# Patient Record
Sex: Female | Born: 1985 | Race: White | Hispanic: No | Smoking: Former smoker
Health system: Southern US, Community
[De-identification: ages and names within clinical notes are randomized; demographics above are authoritative.]

## PROBLEM LIST (undated history)

## (undated) ENCOUNTER — Inpatient Hospital Stay (HOSPITAL_COMMUNITY): Payer: Self-pay

## (undated) DIAGNOSIS — R569 Unspecified convulsions: Secondary | ICD-10-CM

## (undated) DIAGNOSIS — T8859XA Other complications of anesthesia, initial encounter: Secondary | ICD-10-CM

## (undated) DIAGNOSIS — F32A Depression, unspecified: Secondary | ICD-10-CM

## (undated) DIAGNOSIS — J45909 Unspecified asthma, uncomplicated: Secondary | ICD-10-CM

## (undated) DIAGNOSIS — Z8619 Personal history of other infectious and parasitic diseases: Secondary | ICD-10-CM

## (undated) DIAGNOSIS — F1421 Cocaine dependence, in remission: Secondary | ICD-10-CM

## (undated) DIAGNOSIS — Z7289 Other problems related to lifestyle: Secondary | ICD-10-CM

## (undated) DIAGNOSIS — F445 Conversion disorder with seizures or convulsions: Secondary | ICD-10-CM

## (undated) DIAGNOSIS — F4321 Adjustment disorder with depressed mood: Secondary | ICD-10-CM

## (undated) DIAGNOSIS — S42309A Unspecified fracture of shaft of humerus, unspecified arm, initial encounter for closed fracture: Secondary | ICD-10-CM

## (undated) DIAGNOSIS — K603 Anal fistula, unspecified: Secondary | ICD-10-CM

## (undated) DIAGNOSIS — K589 Irritable bowel syndrome without diarrhea: Secondary | ICD-10-CM

## (undated) DIAGNOSIS — K5090 Crohn's disease, unspecified, without complications: Secondary | ICD-10-CM

## (undated) DIAGNOSIS — F1290 Cannabis use, unspecified, uncomplicated: Secondary | ICD-10-CM

## (undated) DIAGNOSIS — F902 Attention-deficit hyperactivity disorder, combined type: Secondary | ICD-10-CM

## (undated) DIAGNOSIS — K5190 Ulcerative colitis, unspecified, without complications: Secondary | ICD-10-CM

## (undated) DIAGNOSIS — S8292XA Unspecified fracture of left lower leg, initial encounter for closed fracture: Secondary | ICD-10-CM

## (undated) DIAGNOSIS — F329 Major depressive disorder, single episode, unspecified: Secondary | ICD-10-CM

## (undated) DIAGNOSIS — F4311 Post-traumatic stress disorder, acute: Secondary | ICD-10-CM

## (undated) DIAGNOSIS — M79673 Pain in unspecified foot: Secondary | ICD-10-CM

## (undated) DIAGNOSIS — Z973 Presence of spectacles and contact lenses: Secondary | ICD-10-CM

## (undated) DIAGNOSIS — N809 Endometriosis, unspecified: Secondary | ICD-10-CM

## (undated) DIAGNOSIS — T7840XA Allergy, unspecified, initial encounter: Secondary | ICD-10-CM

## (undated) DIAGNOSIS — F3181 Bipolar II disorder: Secondary | ICD-10-CM

## (undated) DIAGNOSIS — K219 Gastro-esophageal reflux disease without esophagitis: Secondary | ICD-10-CM

## (undated) DIAGNOSIS — B029 Zoster without complications: Secondary | ICD-10-CM

## (undated) DIAGNOSIS — E282 Polycystic ovarian syndrome: Secondary | ICD-10-CM

## (undated) HISTORY — DX: Allergy, unspecified, initial encounter: T78.40XA

## (undated) HISTORY — DX: Attention-deficit hyperactivity disorder, combined type: F90.2

## (undated) HISTORY — DX: Gastro-esophageal reflux disease without esophagitis: K21.9

## (undated) HISTORY — DX: Ulcerative colitis, unspecified, without complications: K51.90

## (undated) HISTORY — PX: TUBAL LIGATION: SHX77

## (undated) HISTORY — DX: Irritable bowel syndrome, unspecified: K58.9

## (undated) HISTORY — PX: HAND SURGERY: SHX662

## (undated) HISTORY — DX: Depression, unspecified: F32.A

## (undated) HISTORY — PX: APPENDECTOMY: SHX54

---
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
## 2017-03-31 ENCOUNTER — Emergency Department (HOSPITAL_COMMUNITY)
Admission: EM | Admit: 2017-03-31 | Discharge: 2017-03-31 | Disposition: A | Discharge status: Home / Self Care | Payer: Self-pay | Attending: Emergency Medicine | Admitting: Emergency Medicine

## 2017-03-31 ENCOUNTER — Emergency Department (HOSPITAL_COMMUNITY): Payer: Self-pay

## 2017-03-31 ENCOUNTER — Encounter (HOSPITAL_COMMUNITY): Payer: Self-pay

## 2017-03-31 DIAGNOSIS — F17200 Nicotine dependence, unspecified, uncomplicated: Secondary | ICD-10-CM | POA: Insufficient documentation

## 2017-03-31 DIAGNOSIS — R1011 Right upper quadrant pain: Secondary | ICD-10-CM | POA: Insufficient documentation

## 2017-03-31 DIAGNOSIS — Z79899 Other long term (current) drug therapy: Secondary | ICD-10-CM | POA: Insufficient documentation

## 2017-03-31 HISTORY — DX: Crohn's disease, unspecified, without complications: K50.90

## 2017-03-31 LAB — COMPREHENSIVE METABOLIC PANEL
ALK PHOS: 81 U/L (ref 38–126)
ALT: 10 U/L — ABNORMAL LOW (ref 14–54)
AST: 25 U/L (ref 15–41)
Albumin: 3.5 g/dL (ref 3.5–5.0)
Anion gap: 9 (ref 5–15)
BILIRUBIN TOTAL: 1.2 mg/dL (ref 0.3–1.2)
BUN: 8 mg/dL (ref 6–20)
CALCIUM: 9.3 mg/dL (ref 8.9–10.3)
CO2: 22 mmol/L (ref 22–32)
CREATININE: 0.67 mg/dL (ref 0.44–1.00)
Chloride: 106 mmol/L (ref 101–111)
Glucose, Bld: 88 mg/dL (ref 65–99)
Potassium: 4.8 mmol/L (ref 3.5–5.1)
Sodium: 137 mmol/L (ref 135–145)
Total Protein: 6.4 g/dL — ABNORMAL LOW (ref 6.5–8.1)

## 2017-03-31 LAB — CBC
HCT: 39.2 % (ref 36.0–46.0)
Hemoglobin: 12.6 g/dL (ref 12.0–15.0)
MCH: 27.3 pg (ref 26.0–34.0)
MCHC: 32.1 g/dL (ref 30.0–36.0)
MCV: 85 fL (ref 78.0–100.0)
Platelets: 359 10*3/uL (ref 150–400)
RBC: 4.61 MIL/uL (ref 3.87–5.11)
RDW: 14.2 % (ref 11.5–15.5)
WBC: 9.9 10*3/uL (ref 4.0–10.5)

## 2017-03-31 LAB — URINALYSIS, ROUTINE W REFLEX MICROSCOPIC
Bacteria, UA: NONE SEEN
Bilirubin Urine: NEGATIVE
Glucose, UA: NEGATIVE mg/dL
HGB URINE DIPSTICK: NEGATIVE
KETONES UR: NEGATIVE mg/dL
Nitrite: NEGATIVE
PH: 7 (ref 5.0–8.0)
PROTEIN: NEGATIVE mg/dL
RBC / HPF: NONE SEEN RBC/hpf (ref 0–5)
Specific Gravity, Urine: 1.009 (ref 1.005–1.030)

## 2017-03-31 LAB — LIPASE, BLOOD: Lipase: 27 U/L (ref 11–51)

## 2017-03-31 LAB — I-STAT BETA HCG BLOOD, ED (MC, WL, AP ONLY): I-stat hCG, quantitative: <5 m[IU]/mL (ref <5)

## 2017-03-31 MED ORDER — OXYCODONE-ACETAMINOPHEN 5-325 MG PO TABS
2.0000 | ORAL_TABLET | Freq: Once | ORAL | Status: AC
  Administered 2017-03-31: 2 via ORAL
  Filled 2017-03-31: qty 2

## 2017-03-31 MED ORDER — ONDANSETRON 4 MG PO TBDP
8.0000 mg | ORAL_TABLET | Freq: Once | ORAL | Status: AC
  Administered 2017-03-31: 8 mg via ORAL
  Filled 2017-03-31: qty 2

## 2017-03-31 MED ORDER — HYDROMORPHONE HCL 1 MG/ML IJ SOLN
1.0000 mg | Freq: Once | INTRAMUSCULAR | Status: AC
Start: 2017-03-31 — End: 2017-03-31
  Administered 2017-03-31: 1 mg via INTRAMUSCULAR
  Filled 2017-03-31: qty 1

## 2017-03-31 NOTE — ED Notes (Signed)
Patient transported to Ultrasound 

## 2017-03-31 NOTE — ED Notes (Signed)
Pt understood dc material. NAD noted. 

## 2017-03-31 NOTE — ED Provider Notes (Signed)
[facility] DEPT Provider Note   CSN: [id number] Arrival date & time: 03/31/17  1854     History   Chief Complaint Chief Complaint  Patient presents with  . Abdominal Pain    HPI Barbara Orozco is a 31 y.o. female.  Patient c/o ruq abdominal pain for the past 3-4 days. Pain constant, dull, moderate, occasionally worse post eating. No radiation. Nausea. No vomiting or diarrhea. No constipation.  Hx crohns, but this is different. Not currently on any crohns medication.  States is new to area, recently moved. No chest pain or sob. No cough or uri c/o. No dysuria, hematuria or gu c/o. lnmp 1 week ago, normal. No current vaginal discharge or bleeding. No hx gallstones. Denies fever or chills.    The history is provided by the patient.  Abdominal Pain   Associated symptoms include nausea. Pertinent negatives include fever, vomiting, constipation, dysuria and headaches.    Past Medical History:  Diagnosis Date  . Crohn's disease ([place])     There are no active problems to display for this patient.   History reviewed. No pertinent surgical history.  OB History    No data available       Home Medications    Prior to Admission medications   Medication Sig Start Date End Date Taking? Authorizing Provider  pantoprazole (PROTONIX) 40 MG tablet Take 40 mg by mouth 2 (two) times daily.   Yes [provider]  promethazine (PHENERGAN) 25 MG tablet Take 25 mg by mouth every 6 (six) hours as needed for nausea or vomiting.   Yes [provider]    Family History No family history on file.  Social History Social History  Substance Use Topics  . Smoking status: Current Every Day Smoker  . Smokeless tobacco: Never Used  . Alcohol use No     Allergies   Ibuprofen; Tramadol; and Lithium   Review of Systems Review of Systems  Constitutional: Negative for chills and fever.  HENT: Negative for sore throat.   Eyes: Negative for redness.  Respiratory:  Negative for cough and shortness of breath.   Cardiovascular: Negative for chest pain.  Gastrointestinal: Positive for abdominal pain and nausea. Negative for abdominal distention, constipation and vomiting.  Genitourinary: Negative for dysuria, flank pain, vaginal bleeding and vaginal discharge.  Musculoskeletal: Negative for back pain.  Skin: Negative for rash.  Neurological: Negative for headaches.  Hematological: Does not bruise/bleed easily.  Psychiatric/Behavioral: Negative for confusion.     Physical Exam Updated Vital Signs BP (!) 140/94 (BP Location: Left Arm)   Pulse 93   Temp 98.8 F (37.1 C) (Oral)   Resp 18   SpO2 100%   Physical Exam  Constitutional: She appears well-developed and well-nourished. No distress.  HENT:  Mouth/Throat: Oropharynx is clear and moist.  Eyes: Conjunctivae are normal. No scleral icterus.  Neck: Neck supple. No tracheal deviation present.  Cardiovascular: Normal rate, regular rhythm, normal heart sounds and intact distal pulses.  Exam reveals no gallop and no friction rub.   No murmur heard. Pulmonary/Chest: Effort normal and breath sounds normal. No respiratory distress.  Abdominal: Soft. Normal appearance and bowel sounds are normal. She exhibits no distension and no mass. There is tenderness. There is no rebound and no guarding. No hernia.  ruq tenderness.   Genitourinary:  Genitourinary Comments: No cva tenderness  Musculoskeletal: She exhibits no edema.  Neurological: She is alert.  Skin: Skin is warm and dry. No rash noted. She is not diaphoretic.  Psychiatric: She has a normal mood and affect.  Nursing note and vitals reviewed.    ED Treatments / Results  Labs (all labs ordered are listed, but only abnormal results are displayed)   Results for orders placed or performed during the hospital encounter of 03/31/17  Lipase, blood  Result Value Ref Range   Lipase 27 11 - 51 U/L  Comprehensive metabolic panel  Result Value Ref  Range   Sodium 137 135 - 145 mmol/L   Potassium 4.8 3.5 - 5.1 mmol/L   Chloride 106 101 - 111 mmol/L   CO2 22 22 - 32 mmol/L   Glucose, Bld 88 65 - 99 mg/dL   BUN 8 6 - 20 mg/dL   Creatinine, Ser 0.67 0.44 - 1.00 mg/dL   Calcium 9.3 8.9 - 10.3 mg/dL   Total Protein 6.4 (L) 6.5 - 8.1 g/dL   Albumin 3.5 3.5 - 5.0 g/dL   AST 25 15 - 41 U/L   ALT 10 (L) 14 - 54 U/L   Alkaline Phosphatase 81 38 - 126 U/L   Total Bilirubin 1.2 0.3 - 1.2 mg/dL   GFR calc non Af Amer >60 >60 mL/min   GFR calc Af Amer >60 >60 mL/min   Anion gap 9 5 - 15  CBC  Result Value Ref Range   WBC 9.9 4.0 - 10.5 K/uL   RBC 4.61 3.87 - 5.11 MIL/uL   Hemoglobin 12.6 12.0 - 15.0 g/dL   HCT 39.2 36.0 - 46.0 %   MCV 85.0 78.0 - 100.0 fL   MCH 27.3 26.0 - 34.0 pg   MCHC 32.1 30.0 - 36.0 g/dL   RDW 14.2 11.5 - 15.5 %   Platelets 359 150 - 400 K/uL  Urinalysis, Routine w reflex microscopic  Result Value Ref Range   Color, Urine STRAW (A) YELLOW   APPearance CLEAR CLEAR   Specific Gravity, Urine 1.009 1.005 - 1.030   pH 7.0 5.0 - 8.0   Glucose, UA NEGATIVE NEGATIVE mg/dL   Hgb urine dipstick NEGATIVE NEGATIVE   Bilirubin Urine NEGATIVE NEGATIVE   Ketones, ur NEGATIVE NEGATIVE mg/dL   Protein, ur NEGATIVE NEGATIVE mg/dL   Nitrite NEGATIVE NEGATIVE   Leukocytes, UA TRACE (A) NEGATIVE   RBC / HPF NONE SEEN 0 - 5 RBC/hpf   WBC, UA 0-5 0 - 5 WBC/hpf   Bacteria, UA NONE SEEN NONE SEEN   Squamous Epithelial / LPF 0-5 (A) NONE SEEN  I-Stat beta hCG blood, ED  Result Value Ref Range   I-stat hCG, quantitative <5.0 <5 mIU/mL   Comment 3            EKG  EKG Interpretation None       Radiology No results found.  Procedures Procedures (including critical care time)  Medications Ordered in ED Medications - No data to display   Initial Impression / Assessment and Plan / ED Course  I have reviewed the triage vital signs and the nursing notes.  Pertinent labs & imaging results that were available during my  care of the patient were reviewed by me and considered in my medical decision making (see chart for details).  Labs.   Ultrasound.  No prior records here.  U/s neg acute.  Recheck pt comfortable nad.   rec close pcp/gi f/u.   Final Clinical Impressions(s) / ED Diagnoses   Final diagnoses:  None    New Prescriptions New Prescriptions   No medications on file     Forsan,  Lennette Bihari, MD 04/03/17 [phone]

## 2017-03-31 NOTE — ED Triage Notes (Signed)
Patient complains of RUQ abdominal pain x 3-4 days. States I think its my gallbladder, ongoing nausea with same. She also states that she thinks this pain is different from her crohn's disease. Tender to palpitation. Alert and oriented

## 2017-03-31 NOTE — Discharge Instructions (Signed)
It was our pleasure to provide your ER care today - we hope that you feel better.  Your lab work looks good/normal.  Your ultrasound was negative, no gallstones were seen.   Follow up closely with primary care doctor in the coming week, and GI doctor in the next couple weeks - see referral - call office Tuesday AM to arrange appointment.   Return to ER right away if worse, new symptoms, fevers, persistent vomiting, worsening or severe pain, other concern.   You were given pain medication in the ER - no driving for the next 6 hours.

## 2017-04-12 ENCOUNTER — Ambulatory Visit: Payer: Self-pay | Admitting: Internal Medicine

## 2017-09-27 ENCOUNTER — Emergency Department (HOSPITAL_COMMUNITY)
Admission: EM | Admit: 2017-09-27 | Discharge: 2017-09-27 | Disposition: A | Discharge status: Home / Self Care | Payer: Self-pay | Attending: Emergency Medicine | Admitting: Emergency Medicine

## 2017-09-27 ENCOUNTER — Encounter (HOSPITAL_COMMUNITY): Payer: Self-pay | Admitting: Emergency Medicine

## 2017-09-27 ENCOUNTER — Other Ambulatory Visit: Payer: Self-pay

## 2017-09-27 DIAGNOSIS — N3000 Acute cystitis without hematuria: Secondary | ICD-10-CM | POA: Insufficient documentation

## 2017-09-27 DIAGNOSIS — F17210 Nicotine dependence, cigarettes, uncomplicated: Secondary | ICD-10-CM | POA: Insufficient documentation

## 2017-09-27 DIAGNOSIS — Z79899 Other long term (current) drug therapy: Secondary | ICD-10-CM | POA: Insufficient documentation

## 2017-09-27 DIAGNOSIS — G40909 Epilepsy, unspecified, not intractable, without status epilepticus: Secondary | ICD-10-CM | POA: Insufficient documentation

## 2017-09-27 DIAGNOSIS — R1010 Upper abdominal pain, unspecified: Secondary | ICD-10-CM | POA: Insufficient documentation

## 2017-09-27 DIAGNOSIS — R569 Unspecified convulsions: Secondary | ICD-10-CM

## 2017-09-27 HISTORY — DX: Unspecified asthma, uncomplicated: J45.909

## 2017-09-27 HISTORY — DX: Unspecified convulsions: R56.9

## 2017-09-27 LAB — URINALYSIS, ROUTINE W REFLEX MICROSCOPIC
Bilirubin Urine: NEGATIVE
GLUCOSE, UA: NEGATIVE mg/dL
KETONES UR: NEGATIVE mg/dL
NITRITE: NEGATIVE
PROTEIN: NEGATIVE mg/dL
Specific Gravity, Urine: 1.019 (ref 1.005–1.030)
pH: 5 (ref 5.0–8.0)

## 2017-09-27 LAB — CBC WITH DIFFERENTIAL/PLATELET
BASOS ABS: 0.1 10*3/uL (ref 0.0–0.1)
Basophils Relative: 0 %
Eosinophils Absolute: 0.7 10*3/uL (ref 0.0–0.7)
Eosinophils Relative: 6 %
HCT: 38.3 % (ref 36.0–46.0)
HEMOGLOBIN: 12.9 g/dL (ref 12.0–15.0)
LYMPHS ABS: 3.9 10*3/uL (ref 0.7–4.0)
LYMPHS PCT: 31 %
MCH: 28.5 pg (ref 26.0–34.0)
MCHC: 33.7 g/dL (ref 30.0–36.0)
MCV: 84.7 fL (ref 78.0–100.0)
Monocytes Absolute: 0.9 10*3/uL (ref 0.1–1.0)
Monocytes Relative: 7 %
NEUTROS ABS: 7.2 10*3/uL (ref 1.7–7.7)
NEUTROS PCT: 56 %
Platelets: 395 10*3/uL (ref 150–400)
RBC: 4.52 MIL/uL (ref 3.87–5.11)
RDW: 14.2 % (ref 11.5–15.5)
WBC: 12.7 10*3/uL — AB (ref 4.0–10.5)

## 2017-09-27 LAB — I-STAT BETA HCG BLOOD, ED (MC, WL, AP ONLY): I-stat hCG, quantitative: <5 m[IU]/mL (ref <5)

## 2017-09-27 LAB — COMPREHENSIVE METABOLIC PANEL
ALK PHOS: 79 U/L (ref 38–126)
ALT: 14 U/L (ref 14–54)
AST: 19 U/L (ref 15–41)
Albumin: 4 g/dL (ref 3.5–5.0)
Anion gap: 7 (ref 5–15)
BUN: 17 mg/dL (ref 6–20)
CALCIUM: 9.5 mg/dL (ref 8.9–10.3)
CHLORIDE: 108 mmol/L (ref 101–111)
CO2: 24 mmol/L (ref 22–32)
CREATININE: 0.67 mg/dL (ref 0.44–1.00)
Glucose, Bld: 96 mg/dL (ref 65–99)
Potassium: 3.7 mmol/L (ref 3.5–5.1)
Sodium: 139 mmol/L (ref 135–145)
Total Bilirubin: 0.4 mg/dL (ref 0.3–1.2)
Total Protein: 7.7 g/dL (ref 6.5–8.1)

## 2017-09-27 LAB — LIPASE, BLOOD: LIPASE: 31 U/L (ref 11–51)

## 2017-09-27 MED ORDER — ACETAMINOPHEN 500 MG PO TABS
1000.0000 mg | ORAL_TABLET | Freq: Once | ORAL | Status: AC
  Administered 2017-09-27: 1000 mg via ORAL
  Filled 2017-09-27: qty 2

## 2017-09-27 MED ORDER — ONDANSETRON HCL 4 MG/2ML IJ SOLN
4.0000 mg | Freq: Once | INTRAMUSCULAR | Status: AC
  Administered 2017-09-27: 4 mg via INTRAVENOUS
  Filled 2017-09-27: qty 2

## 2017-09-27 MED ORDER — SODIUM CHLORIDE 0.9 % IV BOLUS (SEPSIS)
1000.0000 mL | Freq: Once | INTRAVENOUS | Status: AC
  Administered 2017-09-27: 1000 mL via INTRAVENOUS

## 2017-09-27 MED ORDER — LEVETIRACETAM 750 MG PO TABS: 750.0000 mg | ORAL_TABLET | Freq: Two times a day (BID) | ORAL | 0 refills | Status: DC

## 2017-09-27 MED ORDER — ONDANSETRON HCL 4 MG PO TABS: 4.0000 mg | ORAL_TABLET | Freq: Four times a day (QID) | ORAL | 0 refills | Status: DC

## 2017-09-27 MED ORDER — LORAZEPAM 2 MG/ML IJ SOLN
1.0000 mg | Freq: Once | INTRAMUSCULAR | Status: AC
  Administered 2017-09-27: 1 mg via INTRAVENOUS
  Filled 2017-09-27: qty 1

## 2017-09-27 MED ORDER — CEPHALEXIN 500 MG PO CAPS: 500.0000 mg | ORAL_CAPSULE | Freq: Two times a day (BID) | ORAL | 0 refills | Status: DC

## 2017-09-27 NOTE — ED Provider Notes (Signed)
[facility]-EMERGENCY DEPT Provider Note   CSN: [id number] Arrival date & time: 09/27/17  0045     History   Chief Complaint Chief Complaint  Patient presents with  . Abdominal Pain  . Seizures    HPI Barbara Orozco is a 31 y.o. female.  Patient presents to the emergency department for evaluation of possible seizure.  Patient reports that she woke up from sleep and was shaking and banging her head on the headboard.  She reports that she was incontinent of urine and bowel.  Patient does report that she has a history of seizures, previously took Neurontin for this, but has been noncompliant because of side effects.  After she awakened she acutely became nauseated and had multiple episodes of vomiting.  She is complaining of the pain in her upper abdomen.  She thinks this is because of the amount of vomiting she did, but she does have a history of Crohn's disease.  The abdominal pain she is experiencing, however, is not consistent with her previous Crohn's flares.       Past Medical History:  Diagnosis Date  . Asthma   . Crohn's disease ([facility])   . Seizures ([facility])     There are no active problems to display for this patient.   History reviewed. No pertinent surgical history.  OB History    No data available       Home Medications    Prior to Admission medications   Medication Sig Start Date End Date Taking? Authorizing Provider  pantoprazole (PROTONIX) 40 MG tablet Take 40 mg by mouth 2 (two) times daily.   Yes [provider]  promethazine (PHENERGAN) 25 MG tablet Take 25 mg by mouth every 6 (six) hours as needed for nausea or vomiting.   Yes [provider]  cephALEXin (KEFLEX) 500 MG capsule Take 1 capsule (500 mg total) by mouth 2 (two) times daily. 09/27/17   Gilda Crease, MD  levETIRAcetam (KEPPRA) 750 MG tablet Take 1 tablet (750 mg total) by mouth 2 (two) times daily. 09/27/17   Gilda Crease, MD    ondansetron (ZOFRAN) 4 MG tablet Take 1 tablet (4 mg total) by mouth every 6 (six) hours. 09/27/17   Gilda Crease, MD    Family History History reviewed. No pertinent family history.  Social History Social History   Tobacco Use  . Smoking status: Current Every Day Smoker  . Smokeless tobacco: Never Used  Substance Use Topics  . Alcohol use: No  . Drug use: No     Allergies   Tramadol; Ibuprofen; and Lithium   Review of Systems Review of Systems  Gastrointestinal: Positive for abdominal pain, nausea and vomiting.  Neurological: Positive for seizures.  All other systems reviewed and are negative.    Physical Exam Updated Vital Signs BP 122/69   Pulse 96   Temp 98.5 F (36.9 C) (Oral)   Resp 16   LMP 09/26/2017 (Exact Date)   SpO2 100%   Physical Exam  Constitutional: She is oriented to person, place, and time. She appears well-developed and well-nourished. No distress.  HENT:  Head: Normocephalic and atraumatic.  Right Ear: Hearing normal.  Left Ear: Hearing normal.  Nose: Nose normal.  Mouth/Throat: Oropharynx is clear and moist and mucous membranes are normal.  Eyes: Conjunctivae and EOM are normal. Pupils are equal, round, and reactive to light.  Neck: Normal range of motion. Neck supple.  Cardiovascular: Regular rhythm, S1 normal and S2 normal. Exam  reveals no gallop and no friction rub.  No murmur heard. Pulmonary/Chest: Effort normal and breath sounds normal. No respiratory distress. She exhibits no tenderness.  Abdominal: Soft. Normal appearance and bowel sounds are normal. There is no hepatosplenomegaly. There is tenderness in the right upper quadrant, epigastric area and left upper quadrant. There is no rebound, no guarding, no tenderness at McBurney's point and negative Murphy's sign. No hernia.  Musculoskeletal: Normal range of motion.  Neurological: She is alert and oriented to person, place, and time. She has normal strength. No cranial  nerve deficit or sensory deficit. Coordination normal. GCS eye subscore is 4. GCS verbal subscore is 5. GCS motor subscore is 6.  Skin: Skin is warm, dry and intact. No rash noted. No cyanosis.  Psychiatric: She has a normal mood and affect. Her speech is normal and behavior is normal. Thought content normal.  Nursing note and vitals reviewed.    ED Treatments / Results  Labs (all labs ordered are listed, but only abnormal results are displayed) Labs Reviewed  CBC WITH DIFFERENTIAL/PLATELET - Abnormal; Notable for the following components:      Result Value   WBC 12.7 (*)    All other components within normal limits  URINALYSIS, ROUTINE W REFLEX MICROSCOPIC - Abnormal; Notable for the following components:   APPearance HAZY (*)    Hgb urine dipstick LARGE (*)    Leukocytes, UA MODERATE (*)    Bacteria, UA RARE (*)    Squamous Epithelial / LPF 6-30 (*)    All other components within normal limits  COMPREHENSIVE METABOLIC PANEL  LIPASE, BLOOD  I-STAT BETA HCG BLOOD, ED ([facility], [facility], [facility] ONLY)    EKG  EKG Interpretation  Date/Time:  Friday September 27 2017 01:15:28 EST Ventricular Rate:  81 PR Interval:    QRS Duration: 91 QT Interval:  365 QTC Calculation: 424 R Axis:   14 Text Interpretation:  Sinus rhythm Low voltage, precordial leads Normal ECG Confirmed by Gilda CreasePollina, Tiberius Loftus J [phone]([id number]) on 09/27/2017 1:21:03 AM       Radiology No results found.  Procedures Procedures (including critical care time)  Medications Ordered in ED Medications  sodium chloride 0.9 % bolus 1,000 mL (0 mLs Intravenous Stopped 09/27/17 0300)  ondansetron (ZOFRAN) injection 4 mg (4 mg Intravenous Given 09/27/17 0136)  LORazepam (ATIVAN) injection 1 mg (1 mg Intravenous Given 09/27/17 0137)  acetaminophen (TYLENOL) tablet 1,000 mg (1,000 mg Oral Given 09/27/17 0455)     Initial Impression / Assessment and Plan / ED Course  I have reviewed the triage vital signs and the nursing  notes.  Pertinent labs & imaging results that were available during my care of the patient were reviewed by me and considered in my medical decision making (see chart for details).     Patient presents to the ER for evaluation of possible seizure.  She does have a history of seizure disorder.  She is not currently taking her medications.  She thinks she might have had a seizure a week ago and then tonight had an episode of shaking.  It was somewhat atypical, as she woke up and noticed that she was shaking, hitting her head on the headboard.  There was urinary and fecal incontinence.  She has not had any further seizure-like activity here in the ER.  She complained of abdominal discomfort, mild and crampy.  She had nausea and vomiting after the seizure.  Abdominal exam was nonfocal.  It has improved with antiemetics and IV fluids.  She did have a slight leukocytosis, but no signs of acute surgical process on examination.  There was some right upper quadrant tenderness.  I discussed ultrasound with her.  She reports that she has had similar pain before, and reviewing records reveal she was seen in the ER previously for right upper quadrant abdominal pain.  Ultrasound in May of this year did not show any gallbladder disease.  Patient does not wish to repeat this testing at this time.    Urinalysis is suggestive of infection, although there are increased epithelial cells, possibly contaminated.  Will send culture and treat empirically.  Patient does have a history of Crohn's disease.  Her symptoms are not consistent with her Crohn's flares.  Doubt that she has any significant process such as abscess or obstruction.  No tenderness at McBurney's point.  Patient recently moved to the area, will refer to gastroenterology.  Cannot rule out seizure, although it seems very atypical.  She has had 2 episodes in the last week, however, with a history of seizures, will require treatment prior to outpatient follow-up.   She thinks she was on Neurontin in the past, changed from Depakote because of nausea.  She is unsure of dosing.  She did not tolerate the Neurontin either and stopped it on her own.  She will therefore be initiated on Keppra and referred to neurology for outpatient.  She was counseled that she should not drive for 6 months, no swimming, no operating machinery, climbing ladders, etc.  Final Clinical Impressions(s) / ED Diagnoses   Final diagnoses:  Pain of upper abdomen  Seizure-like activity ([facility])  Acute cystitis without hematuria    ED Discharge Orders        Ordered    levETIRAcetam (KEPPRA) 750 MG tablet  2 times daily     09/27/17 0605    cephALEXin (KEFLEX) 500 MG capsule  2 times daily     09/27/17 0605    ondansetron (ZOFRAN) 4 MG tablet  Every 6 hours     09/27/17 0605       Gilda Crease, MD 09/27/17 [phone]

## 2017-09-27 NOTE — Discharge Instructions (Signed)
Schedule follow-up with Dr. Lavon Paganini, gastroenterology for further evaluation of your pain management of Crohn's disease.  Schedule follow-up with 1 of the listed neurology groups for further evaluation of seizure-like activity.  Do not drive, operate machinery, climb ladders, swim for 6 months, until cleared by neurology.

## 2017-09-27 NOTE — ED Triage Notes (Addendum)
Patient BIB [facility] from home for abdominal pain and possible seizure. Pt woke from sleep banging head on headboard, out of breath,  had an episode of bowel incontinence and then began to "violently" vomit. Pt has hx of Crohn's disease as well as hx of seizures but is not taking her medication at this time. Pt new to the area and has not found a neurologist. Pt also has a hernia that she states seems aggravated by the vomiting.

## 2017-09-27 NOTE — ED Notes (Signed)
Patient aware urine sample needed, will call for assistance when she feels she can try to urinate again.

## 2017-09-27 NOTE — ED Notes (Signed)
EKG given to EDP,Pollina,MD., for review. 

## 2017-12-03 ENCOUNTER — Emergency Department (HOSPITAL_COMMUNITY): Admission: EM | Admit: 2017-12-03 | Discharge: 2017-12-03 | Discharge status: Against Medical Advice | Payer: Self-pay

## 2017-12-04 ENCOUNTER — Emergency Department (HOSPITAL_COMMUNITY)
Admission: EM | Admit: 2017-12-04 | Discharge: 2017-12-04 | Disposition: A | Discharge status: Home / Self Care | Payer: Self-pay | Attending: Emergency Medicine | Admitting: Emergency Medicine

## 2017-12-04 ENCOUNTER — Encounter (HOSPITAL_COMMUNITY): Payer: Self-pay | Admitting: Emergency Medicine

## 2017-12-04 DIAGNOSIS — F17200 Nicotine dependence, unspecified, uncomplicated: Secondary | ICD-10-CM | POA: Insufficient documentation

## 2017-12-04 DIAGNOSIS — Z79899 Other long term (current) drug therapy: Secondary | ICD-10-CM | POA: Insufficient documentation

## 2017-12-04 DIAGNOSIS — J111 Influenza due to unidentified influenza virus with other respiratory manifestations: Secondary | ICD-10-CM | POA: Insufficient documentation

## 2017-12-04 DIAGNOSIS — J45909 Unspecified asthma, uncomplicated: Secondary | ICD-10-CM | POA: Insufficient documentation

## 2017-12-04 DIAGNOSIS — R69 Illness, unspecified: Secondary | ICD-10-CM

## 2017-12-04 MED ORDER — BENZONATATE 100 MG PO CAPS: 100.0000 mg | ORAL_CAPSULE | Freq: Three times a day (TID) | ORAL | 0 refills | Status: DC

## 2017-12-04 NOTE — ED Provider Notes (Signed)
[facility]-EMERGENCY DEPT Provider Note  CSN: [id number] Arrival date & time: 12/04/17 1026  Chief Complaint(s) Cough  HPI Barbara Orozco is a 32 y.o. female   The history is provided by the patient.  Cough  This is a new problem. Episode onset: 3 days. The problem occurs every few minutes. The problem has not changed since onset.The cough is productive of sputum (white). Maximum temperature: 99. Associated symptoms include chills, headaches (typical migraine), rhinorrhea, sore throat and myalgias. She has tried cough syrup for the symptoms. The treatment provided mild relief. She is a smoker. Her past medical history is significant for asthma.    Past Medical History Past Medical History:  Diagnosis Date  . Asthma   . Crohn's disease (HCC)   . Seizures (HCC)    There are no active problems to display for this patient.  Home Medication(s) Prior to Admission medications   Medication Sig Start Date End Date Taking? Authorizing Provider  benzonatate (TESSALON) 100 MG capsule Take 1 capsule (100 mg total) by mouth every 8 (eight) hours. 12/04/17   Makaila Windle, Amadeo Garnet, MD  cephALEXin (KEFLEX) 500 MG capsule Take 1 capsule (500 mg total) by mouth 2 (two) times daily. 09/27/17   Gilda Crease, MD  levETIRAcetam (KEPPRA) 750 MG tablet Take 1 tablet (750 mg total) by mouth 2 (two) times daily. 09/27/17   Gilda Crease, MD  ondansetron (ZOFRAN) 4 MG tablet Take 1 tablet (4 mg total) by mouth every 6 (six) hours. 09/27/17   Gilda Crease, MD  pantoprazole (PROTONIX) 40 MG tablet Take 40 mg by mouth 2 (two) times daily.    [provider]  promethazine (PHENERGAN) 25 MG tablet Take 25 mg by mouth every 6 (six) hours as needed for nausea or vomiting.    [provider]                                                                                                                                    Past Surgical History History  reviewed. No pertinent surgical history. Family History No family history on file.  Social History Social History   Tobacco Use  . Smoking status: Current Every Day Smoker  . Smokeless tobacco: Never Used  Substance Use Topics  . Alcohol use: No  . Drug use: No   Allergies Tramadol; Ibuprofen; and Lithium  Review of Systems Review of Systems  Constitutional: Positive for chills.  HENT: Positive for rhinorrhea and sore throat.   Respiratory: Positive for cough.   Musculoskeletal: Positive for myalgias. Negative for neck pain and neck stiffness.  Neurological: Positive for headaches (typical migraine).   All other systems are reviewed and are negative for acute change except as noted in the HPI  Physical Exam Vital Signs  I have reviewed the triage vital signs BP 112/74 (BP Location: Left Arm)   Pulse 74   Temp 99.1 F (37.3  C) (Oral)   Resp (!) 22   LMP 11/25/2017   SpO2 98%   Physical Exam  Constitutional: She is oriented to person, place, and time. She appears well-developed and well-nourished. No distress.  HENT:  Head: Normocephalic and atraumatic.  Right Ear: Tympanic membrane normal.  Left Ear: Tympanic membrane normal.  Nose: Mucosal edema and rhinorrhea present.  Mouth/Throat: No uvula swelling. Posterior oropharyngeal erythema (mild with PND) present. No posterior oropharyngeal edema or tonsillar abscesses. No tonsillar exudate.  Eyes: Conjunctivae and EOM are normal. Pupils are equal, round, and reactive to light. Right eye exhibits no discharge. Left eye exhibits no discharge. No scleral icterus.  Neck: Normal range of motion. Neck supple.  Cardiovascular: Normal rate and regular rhythm. Exam reveals no gallop and no friction rub.  No murmur heard. Pulmonary/Chest: Effort normal and breath sounds normal. No stridor. No respiratory distress. She has no rales.  Abdominal: Soft. She exhibits no distension. There is no tenderness.  Musculoskeletal: She  exhibits no edema or tenderness.  Neurological: She is alert and oriented to person, place, and time.  Mental Status:  Alert and oriented to person, place, and time.  Attention and concentration normal.  Speech clear.  Recent memory is intact  Cranial Nerves:  II Visual Fields: Intact to confrontation. Visual fields intact. III, IV, VI: Pupils equal and reactive to light and near. Full eye movement without nystagmus  V Facial Sensation: Normal. No weakness of masticatory muscles  VII: No facial weakness or asymmetry  VIII Auditory Acuity: Grossly normal  IX/X: The uvula is midline; the palate elevates symmetrically  XI: Normal sternocleidomastoid and trapezius strength  XII: The tongue is midline. No atrophy or fasciculations.   Motor System: Muscle Strength: 5/5 and symmetric in the upper and lower extremities. No pronation or drift.  Muscle Tone: Tone and muscle bulk are normal in the upper and lower extremities.   Reflexes: DTRs: 1+ and symmetrical in all four extremities. No Clonus Coordination: Intact finger-to-nose, heel-to-shin. No tremor.  Sensation: Intact to light touch. Negative Romberg test.  Gait: Routine gait normal.   Skin: Skin is warm and dry. No rash noted. She is not diaphoretic. No erythema.  Psychiatric: She has a normal mood and affect.  Vitals reviewed.   ED Results and Treatments Labs (all labs ordered are listed, but only abnormal results are displayed) Labs Reviewed - No data to display                                                                                                                       EKG  EKG Interpretation  Date/Time:    Ventricular Rate:    PR Interval:    QRS Duration:   QT Interval:    QTC Calculation:   R Axis:     Text Interpretation:        Radiology No results found. Pertinent labs & imaging results that were available during my care of the patient were reviewed by me and  considered in my medical decision making  (see chart for details).  Medications Ordered in ED Medications - No data to display                                                                                                                                  Procedures Procedures  (including critical care time)  Medical Decision Making / ED Course I have reviewed the nursing notes for this encounter and the patient's prior records (if available in EHR or on provided paperwork).    32 y.o. female presents with flu-like symptoms for 3 days. adequate oral hydration. Rest of history as above.  Patient appears well. No signs of toxicity, patient is interactive. No hypoxia, tachypnea or other signs of respiratory distress. No sign of clinical dehydration. Lung exam clear. Rest of exam as above.  Typical migraine headache for the pt. Non focal neuro exam. No recent head trauma. No fever. Doubt meningitis. Doubt intracranial bleed. Doubt IIH. No indication for imaging.  Most consistent with flu-like illness   No evidence suggestive of pharyngitis, AOM, PNA, or meningitis.  Chest x-ray not indicated at this time.  Discussed symptomatic treatment with the patient and they will follow closely with their PCP.    Final Clinical Impression(s) / ED Diagnoses Final diagnoses:  Influenza-like illness   Disposition: Discharge  Condition: Good  I have discussed the results, Dx and Tx plan with the patient who expressed understanding and agree(s) with the plan. Discharge instructions discussed at great length. The patient was given strict return precautions who verbalized understanding of the instructions. No further questions at time of discharge.    ED Discharge Orders        Ordered    benzonatate (TESSALON) 100 MG capsule  Every 8 hours     12/04/17 1213       Follow Up: Primary care provider  Schedule an appointment as soon as possible for a visit  As needed      This chart was dictated using voice recognition  software.  Despite best efforts to proofread,  errors can occur which can change the documentation meaning.   Nira Conn, MD 12/04/17 1214

## 2017-12-04 NOTE — ED Triage Notes (Signed)
Patient c/o productive cough, congestion, headache, and body aches x 3 days. Speaking in full sentences without difficulty.

## 2017-12-04 NOTE — Discharge Instructions (Addendum)
If you do not have a primary care physician then it is very important that you develop a relationship with one.  Please contact [facility] at [phone] for a referral to many excellent primary care physicians in the community.   ° ° °

## 2017-12-04 NOTE — ED Notes (Signed)
Bed: WTR8 Expected date:  Expected time:  Means of arrival:  Comments: 

## 2017-12-04 NOTE — ED Triage Notes (Signed)
Pt directed to [facility] for follow up

## 2018-01-20 ENCOUNTER — Other Ambulatory Visit: Payer: Self-pay

## 2018-01-20 ENCOUNTER — Encounter (HOSPITAL_COMMUNITY): Payer: Self-pay | Admitting: Emergency Medicine

## 2018-01-20 ENCOUNTER — Emergency Department (HOSPITAL_COMMUNITY)
Admission: EM | Admit: 2018-01-20 | Discharge: 2018-01-21 | Discharge status: Against Medical Advice | Payer: Self-pay | Attending: Emergency Medicine | Admitting: Emergency Medicine

## 2018-01-20 DIAGNOSIS — M25561 Pain in right knee: Secondary | ICD-10-CM | POA: Insufficient documentation

## 2018-01-20 DIAGNOSIS — J45909 Unspecified asthma, uncomplicated: Secondary | ICD-10-CM | POA: Insufficient documentation

## 2018-01-20 DIAGNOSIS — Z87891 Personal history of nicotine dependence: Secondary | ICD-10-CM | POA: Insufficient documentation

## 2018-01-20 DIAGNOSIS — R51 Headache: Secondary | ICD-10-CM | POA: Insufficient documentation

## 2018-01-20 DIAGNOSIS — R1110 Vomiting, unspecified: Secondary | ICD-10-CM | POA: Insufficient documentation
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
## 2018-01-21 ENCOUNTER — Emergency Department (HOSPITAL_COMMUNITY): Payer: Self-pay

## 2018-01-21 MED ORDER — METOCLOPRAMIDE HCL 5 MG/ML IJ SOLN
10.0000 mg | Freq: Once | INTRAMUSCULAR | Status: AC
  Administered 2018-01-21: 10 mg via INTRAVENOUS
  Filled 2018-01-21: qty 2

## 2018-01-21 MED ORDER — ACETAMINOPHEN 500 MG PO TABS
1000.0000 mg | ORAL_TABLET | Freq: Once | ORAL | Status: AC
  Administered 2018-01-21: 1000 mg via ORAL
  Filled 2018-01-21: qty 2

## 2018-01-21 NOTE — ED Notes (Signed)
Patient transported to X-ray 

## 2018-01-21 NOTE — ED Notes (Addendum)
Pt left AMA. Pt did not receive discharge paperwork before leaving. Attempted to contact patient about removal of IV. Unknown if patient left with IV in place.

## 2018-01-21 NOTE — Discharge Instructions (Signed)
Use ice 3-4 times daily alternating 20 minutes on, 20 minutes off.  Please return to the emergency department if you develop any new or worsening symptoms.  Please understand you are leaving AGAINST MEDICAL ADVICE today and that you assume the risks, including death, of not obtaining imaging I advised today.

## 2018-01-21 NOTE — ED Notes (Signed)
Pt back from Xray. Pt refused scans. EDP aware.
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
## 2019-04-03 DIAGNOSIS — K5090 Crohn's disease, unspecified, without complications: Secondary | ICD-10-CM | POA: Diagnosis not present

## 2019-04-03 DIAGNOSIS — R309 Painful micturition, unspecified: Secondary | ICD-10-CM | POA: Diagnosis not present

## 2019-04-03 DIAGNOSIS — Z6841 Body Mass Index (BMI) 40.0 and over, adult: Secondary | ICD-10-CM | POA: Diagnosis not present

## 2019-06-29 DIAGNOSIS — J189 Pneumonia, unspecified organism: Secondary | ICD-10-CM | POA: Diagnosis not present

## 2019-06-29 DIAGNOSIS — Z20828 Contact with and (suspected) exposure to other viral communicable diseases: Secondary | ICD-10-CM | POA: Diagnosis not present

## 2020-01-22 ENCOUNTER — Ambulatory Visit: Payer: BC Managed Care – PPO | Admitting: Family Medicine

## 2020-01-22 ENCOUNTER — Other Ambulatory Visit: Payer: Self-pay

## 2020-01-22 ENCOUNTER — Encounter: Payer: Self-pay | Admitting: Family Medicine

## 2020-01-22 VITALS — BP 130/70 | HR 81 | Temp 97.8°F | Ht 68.0 in | Wt 293.6 lb

## 2020-01-22 DIAGNOSIS — K51911 Ulcerative colitis, unspecified with rectal bleeding: Secondary | ICD-10-CM

## 2020-01-22 DIAGNOSIS — Z8719 Personal history of other diseases of the digestive system: Secondary | ICD-10-CM | POA: Diagnosis not present

## 2020-01-22 DIAGNOSIS — J4520 Mild intermittent asthma, uncomplicated: Secondary | ICD-10-CM | POA: Insufficient documentation

## 2020-01-22 DIAGNOSIS — Z8711 Personal history of peptic ulcer disease: Secondary | ICD-10-CM | POA: Insufficient documentation

## 2020-01-22 DIAGNOSIS — Z833 Family history of diabetes mellitus: Secondary | ICD-10-CM | POA: Diagnosis not present

## 2020-01-22 LAB — COMPREHENSIVE METABOLIC PANEL
ALT: 11 IU/L (ref 0–32)
AST: 12 IU/L (ref 0–40)
Albumin/Globulin Ratio: 1.3 (ref 1.2–2.2)
Albumin: 3.7 g/dL — ABNORMAL LOW (ref 3.8–4.8)
Alkaline Phosphatase: 108 IU/L (ref 39–117)
BUN/Creatinine Ratio: 18 (ref 9–23)
BUN: 12 mg/dL (ref 6–20)
Bilirubin Total: <0.2 mg/dL (ref 0.0–1.2)
CO2: 23 mmol/L (ref 20–29)
Calcium: 8.7 mg/dL (ref 8.7–10.2)
Chloride: 107 mmol/L — ABNORMAL HIGH (ref 96–106)
Creatinine, Ser: 0.67 mg/dL (ref 0.57–1.00)
GFR calc Af Amer: 134 mL/min/{1.73_m2} (ref >59)
GFR calc non Af Amer: 116 mL/min/{1.73_m2} (ref >59)
Globulin, Total: 2.8 g/dL (ref 1.5–4.5)
Glucose: 100 mg/dL — ABNORMAL HIGH (ref 65–99)
Potassium: 4.4 mmol/L (ref 3.5–5.2)
Sodium: 136 mmol/L (ref 134–144)
Total Protein: 6.5 g/dL (ref 6.0–8.5)

## 2020-01-22 LAB — CBC WITH DIFFERENTIAL/PLATELET
Basophils Absolute: 0 10*3/uL (ref 0.0–0.2)
Basos: 0 %
EOS (ABSOLUTE): 0.5 10*3/uL — ABNORMAL HIGH (ref 0.0–0.4)
Eos: 4 %
Hematocrit: 39.4 % (ref 34.0–46.6)
Hemoglobin: 13.1 g/dL (ref 11.1–15.9)
Lymphocytes Absolute: 3.2 10*3/uL — ABNORMAL HIGH (ref 0.7–3.1)
Lymphs: 28 %
MCH: 28.7 pg (ref 26.6–33.0)
MCHC: 33.2 g/dL (ref 31.5–35.7)
MCV: 86 fL (ref 79–97)
Monocytes Absolute: 0.7 10*3/uL (ref 0.1–0.9)
Monocytes: 6 %
Neutrophils Absolute: 6.9 10*3/uL (ref 1.4–7.0)
Neutrophils: 62 %
Platelets: 411 10*3/uL (ref 150–450)
RBC: 4.57 x10E6/uL (ref 3.77–5.28)
RDW: 14.5 % (ref 11.7–15.4)
WBC: 11.3 10*3/uL — ABNORMAL HIGH (ref 3.4–10.8)

## 2020-01-22 MED ORDER — FLUCONAZOLE 150 MG PO TABS: 150.0000 mg | ORAL_TABLET | Freq: Once | ORAL | 0 refills | Status: AC

## 2020-01-22 MED ORDER — ACETAMINOPHEN-CODEINE #3 300-30 MG PO TABS: 1.0000 | ORAL_TABLET | Freq: Three times a day (TID) | ORAL | 0 refills | Status: AC | PRN

## 2020-01-22 MED ORDER — AMOXICILLIN-POT CLAVULANATE 875-125 MG PO TABS: 1.0000 | ORAL_TABLET | Freq: Two times a day (BID) | ORAL | 0 refills | Status: DC

## 2020-01-22 MED ORDER — PANTOPRAZOLE SODIUM 40 MG PO TBEC: 40.0000 mg | DELAYED_RELEASE_TABLET | Freq: Every day | ORAL | 3 refills | Status: DC

## 2020-01-22 MED ORDER — PREDNISONE 20 MG PO TABS: 60.0000 mg | ORAL_TABLET | Freq: Every day | ORAL | 0 refills | Status: DC

## 2020-01-22 NOTE — Progress Notes (Signed)
Subjective:    Patient ID: Barbara Orozco, female    DOB: 1986-09-20, 34 y.o.   MRN: [id number]  HPI Chief Complaint  Patient presents with  . new pt    new pt, get establishes. can't sit due to abcesses on outside of rectum. needs referral to GI   She is new to the practice and here to establish care.  Previous medical care: moved here from [place] in 2019  Diagnosed with UC 10 years ago.  Dr. Derrek Monaco - GI in [place], [place]   Complains of rectal pain, diarrhea with blood and abdominal cramping with a history of UC and rectal abscess.   Reports last major flare was 6 years ago. States she did not have to have I and D then, only antibiotics.   Has been taking Zofran and Bentyl. This was refilled by [facility].   Requests tylenol with codeine. States she has done well with this in the past.  States she has been taking several Tylenol daily and this is not helping.   Requests protonix due to hx of GERD and stomach ulcers.   States she will need medication for yeast infection if she has to take antibiotics.   Denies fever, chills, dizziness, chest pain, palpitations, shortness of breath,  vomiting.    LMP: currently menstruating   Reports hx of PCOS  Asthma - triggers typically season changes  Takes OTC antihistamine    Would like to be checked for diabetes. Mother and father have diabetes.    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 Alcohol is occasional.  Does not use tobacco.    Reviewed allergies, medications, past medical, surgical, family, and social history.    Review of Systems Pertinent positives and negatives in the history of present illness.     Objective:   Physical Exam Constitutional:      General: She is not in acute distress.    Appearance: She is not toxic-appearing.  Genitourinary:    Rectum: Tenderness present.     Comments: Erythema noted  perirectal and she is quite TTP. DRE not done.  Neurological:     Mental Status: She is alert.    BP 130/70   Pulse 81   Temp 97.8 F (36.6 C)   Ht 5' 8"  (1.727 m)   Wt 293 lb 9.6 oz (133.2 kg)   BMI 44.64 kg/m       Assessment & Plan:  Ulcerative colitis with rectal bleeding, unspecified location [facility]) - Plan: Ambulatory referral to Gastroenterology, CBC with Differential/Platelet, Comprehensive metabolic panel, amoxicillin-clavulanate (AUGMENTIN) 875-125 MG tablet, predniSONE (DELTASONE) 20 MG tablet, acetaminophen-codeine (TYLENOL #3) 300-30 MG tablet  History of stomach ulcers - Plan: pantoprazole (PROTONIX) 40 MG tablet, Ambulatory referral to Gastroenterology  History of rectal abscess - Plan: Ambulatory referral to Gastroenterology, CBC with Differential/Platelet, Comprehensive metabolic panel, amoxicillin-clavulanate (AUGMENTIN) 875-125 MG tablet, predniSONE (DELTASONE) 20 MG tablet  Family history of diabetes mellitus - Plan: Hemoglobin A1c  Morbid obesity ([place]) - Plan: Hemoglobin A1c  Mild intermittent asthma without complication  Consulted with Dr. Redmond School regarding patient.  She does not appear toxic but is quite uncomfortable and did not sit down while in the office.  Stat CBC, CMP ordered.  Exam does not show obvious rectal abscess but she is quite tender so I did not perform a DRE.  I will prescribe Augmentin and prednisone. She has taken  this in the past for acute UC flare.  Referral to GI and she was scheduled for a visit next Tuesday.  She has taken tylenol #3 in the past without issues. Confirmed this since she is allergic to Tramadol. PDMP reviewed  Diflucan per request in case of yeast infection.  Protonix prescribed for GERD and hx of stomach ulcers.  She will follow up with me in 2 weeks.  She is aware that she will need to go to the ED if she worsens over the weekend.

## 2020-01-22 NOTE — Patient Instructions (Signed)
Start the antibiotic this evening.  I recommend you start the prednisone in the morning with food.  Use caution when taking Tylenol 3 as this can be sedating.  You have an appointment to see  GI next Tuesday.  If you get worse over the weekend or if you have uncontrolled vomiting and cannot take your medication then you may need to go to the emergency department.  We will be in touch with your lab results

## 2020-01-22 NOTE — Progress Notes (Signed)
Her white blood cell count is very mildly elevated. Other labs are fine also. I am still waiting on her Hgb A1c to screen for diabetes but her blood sugar was 100 today.

## 2020-01-23 LAB — HEMOGLOBIN A1C
Est. average glucose Bld gHb Est-mCnc: 105 mg/dL
Hgb A1c MFr Bld: 5.3 % (ref 4.8–5.6)

## 2020-01-23 NOTE — Progress Notes (Signed)
No sign of diabetes or even prediabetes.

## 2020-01-26 ENCOUNTER — Encounter: Payer: Self-pay | Admitting: Nurse Practitioner

## 2020-01-26 ENCOUNTER — Ambulatory Visit (INDEPENDENT_AMBULATORY_CARE_PROVIDER_SITE_OTHER): Payer: BC Managed Care – PPO | Admitting: Nurse Practitioner

## 2020-01-26 ENCOUNTER — Other Ambulatory Visit (INDEPENDENT_AMBULATORY_CARE_PROVIDER_SITE_OTHER): Payer: BC Managed Care – PPO

## 2020-01-26 ENCOUNTER — Other Ambulatory Visit: Payer: Self-pay

## 2020-01-26 VITALS — BP 133/72 | HR 89 | Temp 97.8°F | Ht 67.0 in | Wt 291.0 lb

## 2020-01-26 DIAGNOSIS — K51919 Ulcerative colitis, unspecified with unspecified complications: Secondary | ICD-10-CM

## 2020-01-26 DIAGNOSIS — Z01818 Encounter for other preprocedural examination: Secondary | ICD-10-CM

## 2020-01-26 DIAGNOSIS — K6289 Other specified diseases of anus and rectum: Secondary | ICD-10-CM

## 2020-01-26 DIAGNOSIS — K219 Gastro-esophageal reflux disease without esophagitis: Secondary | ICD-10-CM

## 2020-01-26 DIAGNOSIS — R1012 Left upper quadrant pain: Secondary | ICD-10-CM

## 2020-01-26 LAB — SEDIMENTATION RATE: Sed Rate: 43 mm/hr — ABNORMAL HIGH (ref 0–20)

## 2020-01-26 LAB — HIGH SENSITIVITY CRP: CRP, High Sensitivity: 11.54 mg/L — ABNORMAL HIGH (ref 0.000–5.000)

## 2020-01-26 NOTE — Patient Instructions (Addendum)
If you are age 34 or older, your body mass index should be between 23-30. Your Body mass index is 45.58 kg/m. If this is out of the aforementioned range listed, please consider follow up with your Primary Care Provider.  If you are age 34 or younger, your body mass index should be between 19-25. Your Body mass index is 45.58 kg/m. If this is out of the aformentioned range listed, please consider follow up with your Primary Care Provider.   You have been scheduled for an endoscopy and colonoscopy. Please follow the written instructions given to you at your visit today. Please pick up your prep supplies at the pharmacy within the next 1-3 days. If you use inhalers (even only as needed), please bring them with you on the day of your procedure. Your physician has requested that you go to www.startemmi.com and enter the access code given to you at your visit today. This web site gives a general overview about your procedure. However, you should still follow specific instructions given to you by our office regarding your preparation for the procedure.  You have been given a Plenvu sample.  Your provider has requested that you go to the basement level for lab work before leaving today. Press "B" on the elevator. The lab is located at the first door on the left as you exit the elevator.  STOP Prednisone.  Continue Augmentin.  Thank you for choosing me and [facility].   Tye Savoy, NP

## 2020-01-26 NOTE — Progress Notes (Signed)
ASSESSMENT / PLAN:   34 year old female with history of morbid obesity, ulcerative colitis, GERD, asthma appendectomy  Ulcerative colitis --Diagnosed 6 years ago at outside facility --Records from [facility] unavailable, practice closed and patient unable to get records --Off treatment (doesn't know what she took) for years due to insurance coverage and also felt like medications did not help.  --Now with flare-like symptoms for several months.  --She has a history of a rectal abscess but also history of "fisulas" which would be unusual for UC. No signs of a fistula on exam today.  She had a small abnormality of left perianal area but nothing appeared to be oozing from it.  She was very tender just examining the perianal area so I did not attempt a DRE --She feels like the pus leakage is a little better after starting Augmentin on Friday, she will complete course of Augmentin  --She was prescribed prednisone Friday.  She is out of pills. I prefer to hold off on continuing steroids for now ( no records available) at least until CRP, ESR results available --Obtain ESR, CRP --Patient will be scheduled for EGD (longstanding GERD and LUQ pain) and colonoscopy. The risks and benefits of EGD and colonoscopy with possible polypectomy / biopsies were discussed and the patient agrees to proceed.    # Longstanding GERD --The most part she is asymptomatic on PPI  # Obesity   HPI:    Chief Complaint: Ulcerative colitis, abdominal pain   Barbara Orozco is a 34 year old female, new to the practice, referred by PCP Harland Dingwall, NP for evaluation of ulcerative colitis.  She moved from [place] to [place] a couple of years ago.  Patient has been a unable to get from previous GI in [place] ( no longer in practice).  Diagnosed with ulcerative colitis approximately 6 years ago.    [place] cannot remember what medication she was taking for UC but had been tried on a couple.  She was  also apparently treated with pain medications at the time and they made her sleepy . She discontinued medications about 3-1/2 to 4 years ago due to loss of insurance but also felt like the medications were not helping.  She managed to get off pain meds and uses a combination of THC and CBD oils t treat her pain. She has a history of a rectal abscess approximately 4 years ago.  Sounds like the abscess was lanced and she was treated with antibiotics.  Barbara Orozco has chronic loose stool dating back to childhood but over the last several months she has noticed an increased frequency of bowel movements with associated lower abdominal cramping. She sees pus and blood in her stool.  Frequently experiences drainage of "pus" type substance  from the anus and has associated rectal discomfort making it difficult to sit for prolonged periods.  Additionally, patient complains of postprandial LUQ pain.  The pain is chronic but has gotten worse over the last 4 months     Data Reviewed:  01/16/2020 WBC 11.3, remainder of CBC normal CMP essentially unremarkable  Past Medical History:  Diagnosis Date  . Asthma   . Crohn's disease ([place])   . GERD (gastroesophageal reflux disease)   . Seizures ([place])   . Ulcerative colitis [facility])      Past Surgical History:  Procedure Laterality Date  . APPENDECTOMY    . HAND SURGERY Left    x3   Family History  Problem Relation Age of Onset  . Colitis Mother   . Ulcerative colitis Mother   . Colitis Father   . Ulcerative colitis Father   . Colitis Paternal Grandmother   . Ulcerative colitis Paternal Grandmother    Social History   Tobacco Use  . Smoking status: Former Research scientist (life sciences)  . Smokeless tobacco: Never Used  Substance Use Topics  . Alcohol use: No  . Drug use: Yes    Types: Marijuana   Current Outpatient Medications  Medication Sig Dispense Refill  . acetaminophen (TYLENOL) 325 MG tablet Take 650 mg by mouth every 6 (six) hours as needed for headache.    Marland Kitchen  amoxicillin-clavulanate (AUGMENTIN) 875-125 MG tablet Take 1 tablet by mouth 2 (two) times daily. 20 tablet 0  . BLACK CURRANT SEED OIL PO Take by mouth.    . Calcium Acetate, Phos Binder, (CALCIUM ACETATE PO) Take by mouth.    . dicyclomine (BENTYL) 10 MG capsule Take 10 mg by mouth daily.    . ondansetron (ZOFRAN) 4 MG tablet Take 1 tablet (4 mg total) by mouth every 6 (six) hours. 12 tablet 0  . pantoprazole (PROTONIX) 40 MG tablet Take 1 tablet (40 mg total) by mouth daily. 30 tablet 3  . VITAMIN D, CHOLECALCIFEROL, PO Take by mouth.     No current facility-administered medications for this visit.   Allergies  Allergen Reactions  . Tramadol Hives  . Ibuprofen     Due to Crohn's Disease  . Lithium Rash     Review of Systems: Positive for fatigue, headaches, menstrual pain, muscle pain and cramps, night sweats and sleeping problems.  All other systems reviewed and negative except where noted in HPI.   Serum creatinine: 0.67 mg/dL 01/22/20 1440 Estimated creatinine clearance: 141.8 mL/min   Physical Exam:    Wt Readings from Last 3 Encounters:  01/26/20 291 lb (132 kg)  01/22/20 293 lb 9.6 oz (133.2 kg)  01/20/18 250 lb (113.4 kg)    BP 133/72   Pulse 89   Temp 97.8 F (36.6 C)   Ht 5' 7"  (1.702 m)   Wt 291 lb (132 kg)   SpO2 98%   BMI 45.58 kg/m  Constitutional:  Pleasant female in no acute distress. Psychiatric: Normal mood and affect. Behavior is normal. EENT: Pupils normal.  Conjunctivae are normal. No scleral icterus. Neck supple.  Cardiovascular: Normal rate, regular rhythm. No edema Pulmonary/chest: Effort normal and breath sounds normal. No wheezing, rales or rhonchi. Abdominal: Limited exam.  Patient could only lie on her side.  Lying on back causes rectal pain.  Abdomen is soft . Bowel sounds active throughout.  Rectal: Left lateral perianal area with a small area containing several pinpoint size lesions. No drainage or oozing. Area tender to manipulate.  DRE not done due to discomfort.   Neurological: Alert and oriented to person place and time. Skin: Skin is warm and dry. No rashes noted.  Tye Savoy, NP  01/26/2020, 10:07 AM  Cc:  Referring Provider Girtha Rm, NP-C

## 2020-01-29 ENCOUNTER — Telehealth: Payer: Self-pay | Admitting: Nurse Practitioner

## 2020-01-29 NOTE — Telephone Encounter (Signed)
Called the patient back. No answer. Left a message asking she call again. The provider is recommending strongly that she go back on prednisone.

## 2020-02-01 ENCOUNTER — Other Ambulatory Visit: Payer: Self-pay

## 2020-02-01 MED ORDER — PREDNISONE 10 MG PO TABS: ORAL_TABLET | ORAL | 0 refills | Status: DC

## 2020-02-01 NOTE — Telephone Encounter (Signed)
See the lab results. Patient agrees to go on a prednisone taper for UC flare. She will start at 40 mg and tapering by 5 mg every 7 days.

## 2020-02-10 ENCOUNTER — Telehealth: Payer: Self-pay | Admitting: Family Medicine

## 2020-02-10 MED ORDER — MONTELUKAST SODIUM 10 MG PO TABS: 10.0000 mg | ORAL_TABLET | Freq: Every day | ORAL | 0 refills | Status: DC

## 2020-02-10 NOTE — Telephone Encounter (Signed)
Sent in med and pt already has appt

## 2020-02-10 NOTE — Telephone Encounter (Signed)
Ok to send in one month of Singulair to take at bedtime. Follow up in 4 weeks.

## 2020-02-10 NOTE — Telephone Encounter (Signed)
Pt Called and wanted to see if she could get some Singulair called into the [facility] on [address]. She stated that she is having to use her inhaler more, having SOB and watery eyes

## 2020-02-12 ENCOUNTER — Ambulatory Visit: Payer: BC Managed Care – PPO | Admitting: Family Medicine

## 2020-02-19 ENCOUNTER — Ambulatory Visit: Payer: BC Managed Care – PPO | Admitting: Family Medicine

## 2020-02-19 ENCOUNTER — Other Ambulatory Visit: Payer: Self-pay

## 2020-02-19 ENCOUNTER — Encounter: Payer: Self-pay | Admitting: Family Medicine

## 2020-02-19 VITALS — BP 120/80 | HR 93 | Temp 97.5°F | Wt 292.2 lb

## 2020-02-19 DIAGNOSIS — R569 Unspecified convulsions: Secondary | ICD-10-CM | POA: Diagnosis not present

## 2020-02-19 DIAGNOSIS — K51911 Ulcerative colitis, unspecified with rectal bleeding: Secondary | ICD-10-CM | POA: Diagnosis not present

## 2020-02-19 NOTE — Patient Instructions (Signed)
Try taking melatonin 5 mg and then increase to 10 mg if needed.   I am referring you to [facility] and you should hear from them next week.  Avoid driving until cleared by them.  If you have a seizure, I recommend that you call 911 or go to the closest emergency department for an evaluation.

## 2020-02-19 NOTE — Progress Notes (Signed)
   Subjective:    Patient ID: Barbara Orozco, female    DOB: 11-12-1985, 34 y.o.   MRN: [id number]  HPI Chief Complaint  Patient presents with  . 2 week follow-up    follow-up. seen GI and on prediosone. was doing well on high dose- but taper off symptoms,    Previous medical care in [place], [place] Dr. Delorse Limber.   Here today for a follow up and with a new concern.   She is on oral steroids now for Crohn's and is due to follow up with GI for a colonoscopy soon.   States she has been having "seizures". Reports having a history of seizure disorder. Not diagnosed by neurology. States this has never been worked up.  Ongoing for 10 or more years.  States she has been having more seizure activity lately and connects this with her crohn's and pain from her condition.  Her significant other is with her today. Describes her seizure as her getting quite and going limp initially and then having whole body "convulsions" for 30 seconds to 1 full minute. She does not respond to him when having these convulsions.  She reports having had a seizure alone and waking up on the bathroom floor in her body fluids.   Stats she does not drive.   Has taken Keppra in the past but did not like how she felt. Felt like a "zombie".  Per her EMR, she was taking Neurontin at one point.   States her mother has a pituitary tumor. Father with epilepsy.   States she just recently got health insurance and not having insurance was a barrier to her being able to get her health under good control.   States she is having sleep issues since being on prednisone. Taking it in the morning. Has tried Benadryl.   Reviewed allergies, medications, past medical, surgical, family, and social history.    Review of Systems Pertinent positives and negatives in the history of present illness.     Objective:   Physical Exam BP 120/80   Pulse 93   Temp (!) 97.5 F (36.4 C)   Wt 292 lb 3.2 oz (132.5 kg)   BMI 45.76  kg/m   Alert and oriented and in no acute distress. She is standing during the visit. Respirations unlabored. Normal speech, mood and thought process.       Assessment & Plan:  Ulcerative colitis with rectal bleeding, unspecified location ([place])  Observed seizure-like activity ([place]) - Plan: Ambulatory referral to Neurology  She is now under the care of GI for Crohn's. Steroids keeping her awake and she is taking them in the morning. Try melatonin for sleeping issue related to steroids.  Seizure activity- urgent referral to neurology. No medication prescribed today. She will not drive. Advised that if she has another seizure that lasts longer than her usual minute to call 911 or go to the ED.

## 2020-02-22 ENCOUNTER — Telehealth: Payer: Self-pay | Admitting: Internal Medicine

## 2020-02-22 ENCOUNTER — Telehealth: Payer: Self-pay | Admitting: Nurse Practitioner

## 2020-02-22 NOTE — Telephone Encounter (Signed)
Barbara Orozco, she was already on antibiotics when I saw her in clinic for the first time.  At that time I did not see an active fistula.  I believe I started her on prednisone based on labs that returned after a clinic visit?  I would like to hold off on antibiotics, we have only seen her once and she is scheduled for endoscopic evaluation in a few days.  She has apparently had UC, untreated for years. Thanks

## 2020-02-22 NOTE — Telephone Encounter (Signed)
Okay. She is good with that. She says if she develops UTI symptoms she will go to an Urgent Care. Apparently she frequently has UTI with a rupture of the fistula. She is on prednisone. She started 25 mg today from 30 mg.

## 2020-02-22 NOTE — Telephone Encounter (Signed)
Pt reported that she has a bleeding fistula and requested antibiotics.  She stated that Augmentin does not work for her.  Pt is scheduled for a colonoscopy 02/25/20 and requested to have covid test in [place] since she is unable to travel to [place] due to bleeding fistula.

## 2020-02-22 NOTE — Telephone Encounter (Signed)
Spoke with the patient. She is out of town for a funeral of her relative. She will be back tomorrow. I can change the time of the COVID test. She states she has a history of fistulas. She felt this particular one developing. It was hard and painful. Burst last night and has begun draining. The pressure pain is better. Now it is her "usual" pain with a fistula. Denies fever. She is asking for antibiotics. Treated with Augmentin last month. She asks not to use that if you will treat. Her colonoscopy and EGD at on Thursday of this week.  Patient was seen last 01/26/20. Please advise.

## 2020-02-22 NOTE — Telephone Encounter (Signed)
Called the patient back. No answer. Left her a voicemail. COVID test that is scheduled for 02/23/20 must be done at the lab in [place] as originally planned.  Need her symptoms about the fistula. She was being treated with Augmentin in March when she came to see Tye Savoy, NP

## 2020-02-22 NOTE — Telephone Encounter (Signed)
Okay. The fistulas are interesting. Those are usually associated with Crohn's not UC. We should know in the next few days if she has UC or Crohn's as she came to [place] with no records and hadn't been treated in year for IBD.

## 2020-02-22 NOTE — Telephone Encounter (Signed)
.  A user error has taken place: error

## 2020-02-22 NOTE — Telephone Encounter (Signed)
Patient is returning your call.  

## 2020-02-23 ENCOUNTER — Encounter: Payer: Self-pay | Admitting: Internal Medicine

## 2020-02-23 ENCOUNTER — Other Ambulatory Visit: Payer: Self-pay | Admitting: Internal Medicine

## 2020-02-23 ENCOUNTER — Ambulatory Visit (INDEPENDENT_AMBULATORY_CARE_PROVIDER_SITE_OTHER): Payer: BC Managed Care – PPO

## 2020-02-23 DIAGNOSIS — Z1159 Encounter for screening for other viral diseases: Secondary | ICD-10-CM

## 2020-02-24 LAB — SARS CORONAVIRUS 2 (TAT 6-24 HRS): SARS Coronavirus 2: NEGATIVE

## 2020-02-25 ENCOUNTER — Other Ambulatory Visit: Payer: Self-pay

## 2020-02-25 ENCOUNTER — Ambulatory Visit
Admission: RE | Admit: 2020-02-25 | Discharge: 2020-02-25 | Disposition: A | Discharge status: Home / Self Care | Payer: BC Managed Care – PPO | Source: Ambulatory Visit | Attending: Nurse Practitioner | Admitting: Nurse Practitioner

## 2020-02-25 ENCOUNTER — Ambulatory Visit (AMBULATORY_SURGERY_CENTER): Payer: Self-pay | Admitting: Internal Medicine

## 2020-02-25 ENCOUNTER — Encounter: Payer: Self-pay | Admitting: Internal Medicine

## 2020-02-25 VITALS — BP 107/61 | HR 85 | Temp 96.8°F | Ht 67.0 in | Wt 291.0 lb

## 2020-02-25 DIAGNOSIS — K611 Rectal abscess: Secondary | ICD-10-CM

## 2020-02-25 DIAGNOSIS — K51911 Ulcerative colitis, unspecified with rectal bleeding: Secondary | ICD-10-CM

## 2020-02-25 DIAGNOSIS — K51919 Ulcerative colitis, unspecified with unspecified complications: Secondary | ICD-10-CM

## 2020-02-25 DIAGNOSIS — R197 Diarrhea, unspecified: Secondary | ICD-10-CM | POA: Diagnosis not present

## 2020-02-25 DIAGNOSIS — R1012 Left upper quadrant pain: Secondary | ICD-10-CM

## 2020-02-25 DIAGNOSIS — K219 Gastro-esophageal reflux disease without esophagitis: Secondary | ICD-10-CM

## 2020-02-25 MED ORDER — HYDROCODONE-ACETAMINOPHEN 5-325 MG PO TABS: 1.0000 | ORAL_TABLET | Freq: Four times a day (QID) | ORAL | 0 refills | Status: DC | PRN

## 2020-02-25 MED ORDER — IOPAMIDOL (ISOVUE-300) INJECTION 61%
100.0000 mL | Freq: Once | INTRAVENOUS | Status: AC | PRN
  Administered 2020-02-25: 100 mL via INTRAVENOUS

## 2020-02-25 MED ORDER — AMOXICILLIN-POT CLAVULANATE 875-125 MG PO TABS: 1.0000 | ORAL_TABLET | Freq: Two times a day (BID) | ORAL | 0 refills | Status: AC

## 2020-02-25 MED ORDER — SODIUM CHLORIDE 0.9 % IV SOLN: 500.0000 mL | Freq: Once | INTRAVENOUS | Status: DC

## 2020-02-25 NOTE — Progress Notes (Addendum)
Barbara Orozco had a seizure last week - unable to do procedures in [name] with seizures within past 3 mos.  Having bloody and purulent dc from perianal area   Also persistent LUQ pain, sig rectal pain - on right and feels internal pressure   Prednisone has helped diarrhea but not as good overall down at 30-25 mg   Was on Augmentin previously  Exam:  abd soft tender mild-mod LUQ   Rectal w/ female staff present - there is a small pointed up lesion with pus at top - tender - not fluctuant, somewhat firm - did not try to express   Plan: 1) Augmentin 2) CT abd/pelvis 3) sitz baths 4) vicodin 5 mg # 10 prn 5) will reschedule endo w/u at hospital 6) she does not drive already 7) prednisone - back to 30 mg for now 8) schedule neuro appt - she plans to call

## 2020-02-25 NOTE — Progress Notes (Signed)
Patient stated she had a seizure last week that lasted about 30 seconds.  Patient does not take any medications when these happen.  Lafe Garin, CRNA and Dr. Carlean Purl are both aware and will discuss options with patient.

## 2020-02-25 NOTE — Progress Notes (Signed)
Barbara Orozco here to give pt instructions for CT scan.  Understanding voiced and pt will go directly to appt.Marland Kitchen

## 2020-02-26 ENCOUNTER — Telehealth: Payer: Self-pay | Admitting: Internal Medicine

## 2020-02-26 ENCOUNTER — Other Ambulatory Visit: Payer: Self-pay

## 2020-02-26 MED ORDER — PREDNISONE 10 MG PO TABS: 30.0000 mg | ORAL_TABLET | Freq: Every day | ORAL | 0 refills | Status: DC

## 2020-02-26 NOTE — Telephone Encounter (Signed)
I told her we have sent in #100 Prednisone today so she will have plenty. She does want to know how long you want her to continue it. She is aware that Dr Carlean Purl is away today.   She also would like to request a diflucan rx for the possible yeast infection she gets when taking antibiotics. She said it usually happens after about 4 days on the antibiotics.

## 2020-02-26 NOTE — Telephone Encounter (Signed)
Called the patient. She asked me about the prednisone as well. I also sent a renewal of the prednisone.

## 2020-02-26 NOTE — Telephone Encounter (Signed)
Pt states that Dr. Carlean Purl told her yesterday to go back on prednisone 56m. Pt wants to know how long she needs to take 364m She only has pills until Monday. Pls call her.

## 2020-02-26 NOTE — Telephone Encounter (Signed)
-----   Message from Gatha Mayer, MD sent at 02/26/2020  8:00 AM EDT ----- Regarding: ct results [name],  If you will let Barbara Orozco know there is not anything that needs surgery - I.e. no large abscess.  She needs to continue with treatment plan and we will contact her when I am back next week re: setting up a colonoscopy at the hospital.  I hope she is setting up a neurology appt re: seizures as well.  Also - the "diarrheal state" on the CT is her colon prep and we do not need stool cultures as radiologist suggested.  CE[facility]

## 2020-02-29 ENCOUNTER — Other Ambulatory Visit: Payer: Self-pay

## 2020-02-29 MED ORDER — FLUCONAZOLE 100 MG PO TABS: 100.0000 mg | ORAL_TABLET | Freq: Every day | ORAL | 0 refills | Status: AC

## 2020-03-02 NOTE — Telephone Encounter (Addendum)
PJ - she needs a colonoscopy at the hospital due to seizure disorder we had to cancel when she came   1) Please find out how she is doing 2) see if we can do it as last case on my next scope day at hospital may 11 at 1215ish  Dx is colitis

## 2020-03-02 NOTE — Telephone Encounter (Signed)
I spoke to [place] and she was at work and couldn't check her schedule to  see if that date will work, she will call me in the AM.   She is still having a lot of drainage and pus from the abscess. She is taking her antibiotics, no fever.

## 2020-03-03 NOTE — Telephone Encounter (Signed)
Please advise another date/time Sir, thank you.

## 2020-03-03 NOTE — Telephone Encounter (Signed)
Pt returned your call. She stated that she cannot do that date as she was not able to get off from work. She stated that she needs 2 weeks notice at her work.

## 2020-03-04 ENCOUNTER — Encounter: Payer: Self-pay | Admitting: Neurology

## 2020-03-04 NOTE — Telephone Encounter (Signed)
Patient informed and I told her we are still working on getting a hospital date for her.

## 2020-03-04 NOTE — Telephone Encounter (Signed)
Reduce to 25 mg (2.5 tabs daily)

## 2020-03-04 NOTE — Telephone Encounter (Signed)
Please advise regarding her prednisone dose Sir, thank you?

## 2020-03-10 ENCOUNTER — Telehealth: Payer: Self-pay | Admitting: Internal Medicine

## 2020-03-10 NOTE — Telephone Encounter (Signed)
I spoke to Barbara Orozco briefly as she is at work. She can do 04/26/2020. She is going to call me back on her lunch break today so we can talk more.

## 2020-03-10 NOTE — Telephone Encounter (Signed)
Patient will be available until 3:30pm today.

## 2020-03-10 NOTE — Telephone Encounter (Signed)
I cannot do colonoscopy until 6/22 - please see if she can do that day  Need a symptom update and she should try to schedule a f/u w/ Nevin Bloodgood later this month or in early June  We can do the prep Rx etc then  Let me know

## 2020-03-11 ENCOUNTER — Other Ambulatory Visit: Payer: Self-pay | Admitting: Internal Medicine

## 2020-03-11 DIAGNOSIS — K51911 Ulcerative colitis, unspecified with rectal bleeding: Secondary | ICD-10-CM

## 2020-03-11 MED ORDER — DIPHENOXYLATE-ATROPINE 2.5-0.025 MG PO TABS: 1.0000 | ORAL_TABLET | Freq: Four times a day (QID) | ORAL | 0 refills | Status: DC | PRN

## 2020-03-11 MED ORDER — ACETAMINOPHEN-CODEINE #3 300-30 MG PO TABS: 1.0000 | ORAL_TABLET | ORAL | 0 refills | Status: DC | PRN

## 2020-03-11 NOTE — Telephone Encounter (Signed)
Patient informed and I will fax paperwork for urgent referral to [facility] today.   Barbara Orozco has another question of would youi be able to do FMLA paperwork for her to get medical bathroon breaks and at least one day off a week for Dr appointments? 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  She said she was to have an EGD also which I see was cancelled so I called the hospital and this has been added to the case.

## 2020-03-11 NOTE — Telephone Encounter (Signed)
I spoke with Barbara Orozco and she is still having problems. No fever. Still pain under her ribs on the left side. Past few days a lot of drainage-green and pus. , bright red blood with wiping. There is a Hard knot where her abscess is. She is having 8-9 episodes of diarrhea daily. She takes her last dose of antibiotic tonight. She has several questions: Do you want her to continue her current dose of prednisone which is 83m daily. She said she works 12 hour days and can't do that on vicodin. She is requesting something " less strong" like tylenol #3 .   She has a PFreeportappointment for 03/29/2020 at 2:00pm.  I will go ahead and book her colon for 04/26/20 and her covid testing for 04/22/2020. These dates are okay per patient.

## 2020-03-11 NOTE — Telephone Encounter (Signed)
Tylenol # 3 called in and also Lomotil (for diarrhea but may help pain)  Refer to [facility] srurgery re: perirectal abscess  Stay on 25 mg prednisone

## 2020-03-14 NOTE — Telephone Encounter (Signed)
Yes I will fill out FMLA

## 2020-03-14 NOTE — Telephone Encounter (Signed)
Patient informed and she will take the FMLA paperwork to where they told her to take it today. She also ask to be put on the cancellation list for Nevin Bloodgood to get seen sooner if possible. I will put her on the list.

## 2020-03-21 ENCOUNTER — Telehealth: Payer: Self-pay | Admitting: Internal Medicine

## 2020-03-21 DIAGNOSIS — K6289 Other specified diseases of anus and rectum: Secondary | ICD-10-CM | POA: Diagnosis not present

## 2020-03-21 NOTE — Telephone Encounter (Signed)
I have sent  Jaydyn a [facility] message and called and left her a voice mail with the appointment informaion.  I called [facility] and they said her appointment is today at 1:30pm, she is to arrive at 1:00pm.

## 2020-03-24 ENCOUNTER — Telehealth: Payer: Self-pay | Admitting: Family Medicine

## 2020-03-24 NOTE — Telephone Encounter (Signed)
Received a fax from [facility] in response to records request. Per facility not a pt.

## 2020-03-29 ENCOUNTER — Other Ambulatory Visit: Payer: Self-pay | Admitting: Internal Medicine

## 2020-03-29 ENCOUNTER — Encounter: Payer: Self-pay | Admitting: Nurse Practitioner

## 2020-03-29 ENCOUNTER — Ambulatory Visit (INDEPENDENT_AMBULATORY_CARE_PROVIDER_SITE_OTHER): Payer: BC Managed Care – PPO | Admitting: Nurse Practitioner

## 2020-03-29 VITALS — BP 110/80 | HR 76 | Ht 67.0 in | Wt 293.0 lb

## 2020-03-29 DIAGNOSIS — G40909 Epilepsy, unspecified, not intractable, without status epilepticus: Secondary | ICD-10-CM | POA: Diagnosis not present

## 2020-03-29 DIAGNOSIS — K529 Noninfective gastroenteritis and colitis, unspecified: Secondary | ICD-10-CM | POA: Diagnosis not present

## 2020-03-29 DIAGNOSIS — K51911 Ulcerative colitis, unspecified with rectal bleeding: Secondary | ICD-10-CM | POA: Diagnosis not present

## 2020-03-29 MED ORDER — PREDNISONE 10 MG PO TABS: 25.0000 mg | ORAL_TABLET | Freq: Every day | ORAL | 1 refills | Status: DC

## 2020-03-29 NOTE — Patient Instructions (Signed)
Stop your lomotil.  We have sent the following medications to your pharmacy for you to pick up at your convenience: Prednisone  You have been scheduled for an endoscopy and colonoscopy. Please follow the written instructions given to you at your visit today. Please pick up your prep supplies at the pharmacy within the next 1-3 days. If you use inhalers (even only as needed), please bring them with you on the day of your procedure.   I appreciate the opportunity to care for you. Tye Savoy, NP-C

## 2020-03-30 ENCOUNTER — Encounter: Payer: Self-pay | Admitting: Nurse Practitioner

## 2020-03-30 NOTE — Progress Notes (Signed)
IMPRESSION and PLAN:    34 yo female with pmh significant for morbid obesity, inflammatory bowel disease, GERD, asthma and appendectomy.   # IBD, ulcerative colitis per patient --No records available.  Apparently diagnosed many years ago.  Off treatment for years --Intermittent perianal abscesses requiring antibiotics.  --We have been trying to get her colonoscopy done but she had a recent seizure and then there were some scheduling problems with her work.  She has now been scheduled to have colonoscopy late June at the hospital.  --Diarrhea has been difficult to manage.  She is very sensitive to antimotility agents as just one tablet can completely lock up her bowels. Hold off on Imodium and Lomotil for now --It would be helpful if we could solidify her bowel movements.  Perhaps cholestyramine would be helpful?? --Continue prednisone 25 mg until time of colonoscopy.  Refills provided. --There is no obvious abscess on exam today, no need for a third round of Augmentin.  # Seizure Disorder --She had another " small seizure" a week or so ago.  I will pass this information on to Dr. Carlean Purl to see if this her fears with plans for colonoscopy next month.  --Unfortunately patient has not been able to secure an appointment with Neurology until 06/10/20.   # LUQ pain --She is scheduled for EGD to be done at time of colonoscopy next month.  Pain is exacerbated by bending over, I do wonder about a musculoskeletal component to the pain.   HPI:    Primary GI: Silvano Rusk, MD  Chief complaint : IBD / abscess follow up  01/26/20 established care here for history of ulcerative colitis diagnosed 6 years ago at outside facility.  Fortunately, records from [facility] practice unavailable.  Patient had been off treatment for years due to lack of insurance coverage and also felt like medications did not work.  She presented to [place] with flare-like symptoms.  She was having rectal discomfort, gave a  history of rectal abscesses and possibly fistulas.  Several days prior to our visit PCP had treated her with Augmentin and prednisone. On my exam there was no sign of fistula/abscess that day.  Patient also gave history of longstanding GERD and was having LUQ pain.  Labs including inflammatory markers were obtained, she was scheduled for an EGD and colonoscopy.  Inflammatory markers came back elevated, I called started her on tapering dose of prednisone.   02/22/20 Patient called office, reported that she has a bleeding fistula and requested antibiotics.    The message was sent to me, I opted to hold off on antibiotics since I had not seen an abscess/fistula on exam a few weeks earlier.  Plan was to wait and see what colonoscopy showed.  02/25/20 -patient came for colonoscopy.  Unfortunately she had experienced a seizure the week prior and we were unable to proceed with colonoscopy.  Dr. Carlean Purl saw her and described a small perianal lesion with pus at the top, area was tender but not fluctuant, it was somewhat firm.  He prescribed Augmentin, sitz baths, Vicodin and schedule her for CT scan of the abdomen and pelvis.  He rescheduled endoscopic procedures to be done at the hospital.  He increase prednisone back to 30 mg daily.  There was some difficulty getting EGD colonoscopy scheduled due to patient's work schedule.  She finally got scheduled to have the procedures done on 04/26/2021.  Dr. Carlean Purl asked that she come in to see me  in the interim to get a condition update.   03/11/20  Phone call.  Still having LUQ pain, a lot of green drainage/pus when wiping.  Started on Lomotil and Tylenol 3.   A week ago - [facility] 1.5 weeks ago, told no active abscess requiring intervention.  Patient gets frustrated because her problems with the abscess are often not present when she sees a healthcare provider   HISTORY SINCE LAST VISIT:   Nikol is still taking prednisone 25 mg daily.  She is unable to  tolerate the Lomotil.  Just 1 tablet stops her from having any bowel movements.  Lomotil does not change the consistency of her loose stool it just stops her bowel movements altogether.  When she does have a bowel movement the next day, stool is still loose.  She has a similar situation with Imodium.  1 Imodium is insufficient but to completely lock up her bowels.  Overall she feels better but still has discomfort when sitting.  Some days she sees blood and pus in her stool other days not.  She cannot determine if the blood and pus is coming from the rectum or from a perianal lesion.  Sometimes she leaks pus and blood in between bowel movements. She continues to have LUQ pain described as a pressure sensation.  The discomfort is sometimes worse after eating but also worse when hungry.  The pain becomes sharp when she bends over making it difficult to even tie her shoes    Review of systems:     No chest pain, no SOB, no fevers, no urinary sx   Past Medical History:  Diagnosis Date  . Allergy   . Asthma   . Crohn's disease ([place])   . Depression   . GERD (gastroesophageal reflux disease)   . Seizures ([place])    last seizure 1 week ago  . Ulcerative colitis ([place])     Patient's surgical history, family medical history, social history, medications and allergies were all reviewed in Epic   Creatinine clearance cannot be calculated (Patient's most recent lab result is older than the maximum 21 days allowed.)  Current Outpatient Medications  Medication Sig Dispense Refill  . acetaminophen (TYLENOL) 325 MG tablet Take 650 mg by mouth every 6 (six) hours as needed for headache.    Marland Kitchen acetaminophen-codeine (TYLENOL #3) 300-30 MG tablet Take 1 tablet by mouth every 4 (four) hours as needed for moderate pain. 45 tablet 0  . BLACK CURRANT SEED OIL PO Take by mouth.    . Calcium Acetate, Phos Binder, (CALCIUM ACETATE PO) Take by mouth.    . dicyclomine (BENTYL) 10 MG capsule Take 10 mg by mouth daily.    .  diphenoxylate-atropine (LOMOTIL) 2.5-0.025 MG tablet Take 1 tablet by mouth 4 (four) times daily as needed for diarrhea or loose stools. 60 tablet 0  . HYDROcodone-acetaminophen (NORCO/VICODIN) 5-325 MG tablet Take 1 tablet by mouth every 6 (six) hours as needed for severe pain. 10 tablet 0  . montelukast (SINGULAIR) 10 MG tablet Take 1 tablet (10 mg total) by mouth at bedtime. 30 tablet 0  . ondansetron (ZOFRAN) 4 MG tablet Take 1 tablet (4 mg total) by mouth every 6 (six) hours. 12 tablet 0  . pantoprazole (PROTONIX) 40 MG tablet Take 1 tablet (40 mg total) by mouth daily. 30 tablet 3  . predniSONE (DELTASONE) 10 MG tablet Take 2.5 tablets (25 mg total) by mouth daily with breakfast. 100 tablet 1  . VITAMIN  D, CHOLECALCIFEROL, PO Take by mouth.     Current Facility-Administered Medications  Medication Dose Route Frequency Provider Last Rate Last Admin  . 0.9 %  sodium chloride infusion  500 mL Intravenous Once Gatha Mayer, MD        Physical Exam:     BP 110/80   Pulse 76   Ht 5' 7"  (1.702 m)   Wt 293 lb (132.9 kg)   LMP 03/14/2020 (Approximate)   BMI 45.89 kg/m   GENERAL:  Pleasant female in NAD PSYCH: : Cooperative, normal affect CARDIAC:  RRR PULM: Normal respiratory effort, lungs CTA bilaterally, no wheezing ABDOMEN:  Nondistended, soft, nontender. No obvious masses, no hepatomegaly,  normal bowel sounds Rectal: tiny firm lesion just right of posterior midline, non-draining. No perianal fluctuance / erythema and she wasn't tender in the area  SKIN:  turgor, no lesions seen Musculoskeletal:  Normal muscle tone, normal strength NEURO: Alert and oriented x 3, no focal neurologic deficits   Tye Savoy , NP 03/30/2020, 4:39 PM

## 2020-04-05 DIAGNOSIS — Z8616 Personal history of COVID-19: Secondary | ICD-10-CM

## 2020-04-05 HISTORY — DX: Personal history of COVID-19: Z86.16

## 2020-04-19 NOTE — Progress Notes (Signed)
Information placed regardin hx of seizures in [facility] on OR schedule.  Printed off copy of Endo Schedule and given to Konrad Felix, [facility] and made her aware.

## 2020-04-20 ENCOUNTER — Encounter (HOSPITAL_COMMUNITY): Payer: Self-pay | Admitting: Physician Assistant

## 2020-04-22 ENCOUNTER — Other Ambulatory Visit (HOSPITAL_COMMUNITY)
Admission: RE | Admit: 2020-04-22 | Discharge: 2020-04-22 | Disposition: A | Discharge status: Home / Self Care | Payer: BC Managed Care – PPO | Source: Ambulatory Visit | Attending: Internal Medicine | Admitting: Internal Medicine

## 2020-04-22 DIAGNOSIS — Z20822 Contact with and (suspected) exposure to covid-19: Secondary | ICD-10-CM | POA: Insufficient documentation

## 2020-04-22 DIAGNOSIS — Z01812 Encounter for preprocedural laboratory examination: Secondary | ICD-10-CM | POA: Diagnosis not present

## 2020-04-23 LAB — SARS CORONAVIRUS 2 (TAT 6-24 HRS): SARS Coronavirus 2: NEGATIVE

## 2020-04-25 ENCOUNTER — Telehealth: Payer: Self-pay | Admitting: Nurse Practitioner

## 2020-04-25 ENCOUNTER — Encounter: Payer: Self-pay | Admitting: Family Medicine

## 2020-04-25 ENCOUNTER — Other Ambulatory Visit: Payer: Self-pay

## 2020-04-25 ENCOUNTER — Inpatient Hospital Stay (HOSPITAL_COMMUNITY)
Admission: AD | Admit: 2020-04-25 | Discharge: 2020-04-25 | Disposition: A | Discharge status: Home / Self Care | Payer: BC Managed Care – PPO | Attending: Obstetrics and Gynecology | Admitting: Obstetrics and Gynecology

## 2020-04-25 ENCOUNTER — Inpatient Hospital Stay (HOSPITAL_COMMUNITY): Payer: BC Managed Care – PPO

## 2020-04-25 ENCOUNTER — Ambulatory Visit: Payer: BC Managed Care – PPO | Admitting: Family Medicine

## 2020-04-25 ENCOUNTER — Encounter (HOSPITAL_COMMUNITY): Payer: Self-pay | Admitting: Obstetrics and Gynecology

## 2020-04-25 VITALS — BP 124/84 | HR 87 | Temp 98.6°F | Wt 286.0 lb

## 2020-04-25 DIAGNOSIS — R109 Unspecified abdominal pain: Secondary | ICD-10-CM | POA: Diagnosis not present

## 2020-04-25 DIAGNOSIS — Z3A01 Less than 8 weeks gestation of pregnancy: Secondary | ICD-10-CM

## 2020-04-25 DIAGNOSIS — Z3201 Encounter for pregnancy test, result positive: Secondary | ICD-10-CM

## 2020-04-25 DIAGNOSIS — R102 Pelvic and perineal pain: Secondary | ICD-10-CM | POA: Diagnosis not present

## 2020-04-25 DIAGNOSIS — K219 Gastro-esophageal reflux disease without esophagitis: Secondary | ICD-10-CM | POA: Diagnosis not present

## 2020-04-25 DIAGNOSIS — Z79899 Other long term (current) drug therapy: Secondary | ICD-10-CM | POA: Diagnosis not present

## 2020-04-25 DIAGNOSIS — Z8742 Personal history of other diseases of the female genital tract: Secondary | ICD-10-CM

## 2020-04-25 DIAGNOSIS — O3680X0 Pregnancy with inconclusive fetal viability, not applicable or unspecified: Secondary | ICD-10-CM

## 2020-04-25 DIAGNOSIS — O99611 Diseases of the digestive system complicating pregnancy, first trimester: Secondary | ICD-10-CM | POA: Diagnosis not present

## 2020-04-25 DIAGNOSIS — Z87891 Personal history of nicotine dependence: Secondary | ICD-10-CM | POA: Diagnosis not present

## 2020-04-25 DIAGNOSIS — N898 Other specified noninflammatory disorders of vagina: Secondary | ICD-10-CM | POA: Diagnosis not present

## 2020-04-25 DIAGNOSIS — O99511 Diseases of the respiratory system complicating pregnancy, first trimester: Secondary | ICD-10-CM | POA: Insufficient documentation

## 2020-04-25 DIAGNOSIS — Z888 Allergy status to other drugs, medicaments and biological substances status: Secondary | ICD-10-CM | POA: Insufficient documentation

## 2020-04-25 DIAGNOSIS — J45909 Unspecified asthma, uncomplicated: Secondary | ICD-10-CM | POA: Diagnosis not present

## 2020-04-25 DIAGNOSIS — O26891 Other specified pregnancy related conditions, first trimester: Secondary | ICD-10-CM | POA: Diagnosis not present

## 2020-04-25 DIAGNOSIS — Z885 Allergy status to narcotic agent status: Secondary | ICD-10-CM | POA: Diagnosis not present

## 2020-04-25 DIAGNOSIS — N939 Abnormal uterine and vaginal bleeding, unspecified: Secondary | ICD-10-CM | POA: Diagnosis not present

## 2020-04-25 DIAGNOSIS — Z7952 Long term (current) use of systemic steroids: Secondary | ICD-10-CM | POA: Insufficient documentation

## 2020-04-25 DIAGNOSIS — O26899 Other specified pregnancy related conditions, unspecified trimester: Secondary | ICD-10-CM

## 2020-04-25 DIAGNOSIS — K5090 Crohn's disease, unspecified, without complications: Secondary | ICD-10-CM | POA: Diagnosis not present

## 2020-04-25 DIAGNOSIS — Z3A00 Weeks of gestation of pregnancy not specified: Secondary | ICD-10-CM | POA: Diagnosis not present

## 2020-04-25 DIAGNOSIS — O209 Hemorrhage in early pregnancy, unspecified: Secondary | ICD-10-CM | POA: Diagnosis not present

## 2020-04-25 DIAGNOSIS — O200 Threatened abortion: Secondary | ICD-10-CM

## 2020-04-25 DIAGNOSIS — Z886 Allergy status to analgesic agent status: Secondary | ICD-10-CM | POA: Insufficient documentation

## 2020-04-25 DIAGNOSIS — M545 Low back pain, unspecified: Secondary | ICD-10-CM

## 2020-04-25 LAB — COMPREHENSIVE METABOLIC PANEL
ALT: 19 U/L (ref 0–44)
AST: 13 U/L — ABNORMAL LOW (ref 15–41)
Albumin: 3.5 g/dL (ref 3.5–5.0)
Alkaline Phosphatase: 62 U/L (ref 38–126)
Anion gap: 7 (ref 5–15)
BUN: 10 mg/dL (ref 6–20)
CO2: 21 mmol/L — ABNORMAL LOW (ref 22–32)
Calcium: 8.7 mg/dL — ABNORMAL LOW (ref 8.9–10.3)
Chloride: 110 mmol/L (ref 98–111)
Creatinine, Ser: 0.72 mg/dL (ref 0.44–1.00)
GFR calc Af Amer: >60 mL/min (ref >60)
GFR calc non Af Amer: >60 mL/min (ref >60)
Glucose, Bld: 114 mg/dL — ABNORMAL HIGH (ref 70–99)
Potassium: 4.4 mmol/L (ref 3.5–5.1)
Sodium: 138 mmol/L (ref 135–145)
Total Bilirubin: 0.6 mg/dL (ref 0.3–1.2)
Total Protein: 6.6 g/dL (ref 6.5–8.1)

## 2020-04-25 LAB — POCT URINE PREGNANCY: Preg Test, Ur: POSITIVE — AB

## 2020-04-25 LAB — URINALYSIS, ROUTINE W REFLEX MICROSCOPIC
Bilirubin Urine: NEGATIVE
Glucose, UA: NEGATIVE mg/dL
Ketones, ur: NEGATIVE mg/dL
Leukocytes,Ua: NEGATIVE
Nitrite: NEGATIVE
Protein, ur: NEGATIVE mg/dL
Specific Gravity, Urine: 1.01 (ref 1.005–1.030)
pH: 6 (ref 5.0–8.0)

## 2020-04-25 LAB — CBC
HCT: 43.2 % (ref 36.0–46.0)
Hemoglobin: 14 g/dL (ref 12.0–15.0)
MCH: 28.7 pg (ref 26.0–34.0)
MCHC: 32.4 g/dL (ref 30.0–36.0)
MCV: 88.7 fL (ref 80.0–100.0)
Platelets: 318 10*3/uL (ref 150–400)
RBC: 4.87 MIL/uL (ref 3.87–5.11)
RDW: 13.4 % (ref 11.5–15.5)
WBC: 7.8 10*3/uL (ref 4.0–10.5)
nRBC: 0 % (ref 0.0–0.2)

## 2020-04-25 LAB — ABO/RH: ABO/RH(D): A POS

## 2020-04-25 LAB — HCG, QUANTITATIVE, PREGNANCY: hCG, Beta Chain, Quant, S: 65 m[IU]/mL — ABNORMAL HIGH (ref <5)

## 2020-04-25 LAB — WET PREP, GENITAL
Clue Cells Wet Prep HPF POC: NONE SEEN
Sperm: NONE SEEN
Trich, Wet Prep: NONE SEEN
Yeast Wet Prep HPF POC: NONE SEEN

## 2020-04-25 MED ORDER — OXYCODONE-ACETAMINOPHEN 2.5-325 MG PO TABS: 1.0000 | ORAL_TABLET | ORAL | 0 refills | Status: DC | PRN

## 2020-04-25 MED ORDER — OXYCODONE-ACETAMINOPHEN 7.5-325 MG PO TABS: 1.0000 | ORAL_TABLET | ORAL | 0 refills | Status: DC | PRN

## 2020-04-25 NOTE — Telephone Encounter (Signed)
Patient called to advise she just had a miscarriage is on her way to the hospital. She is asking if the procedure for tomorrow needs to be canceled or would she need to come in okay to leave detailed message

## 2020-04-25 NOTE — Progress Notes (Signed)
   Subjective:    Patient ID: Barbara Orozco, female    DOB: 10-Mar-1986, 34 y.o.   MRN: [id number]  HPI Chief Complaint  Patient presents with  . bleeding 2 weeks    2 week straight with bleeding and big clots (golfball size). random. has pcos. very painful   Complains of vaginal bleeding that started very light 2 weeks ago and then 1 week the bleeding became thick, heavy, and she has had large, dark blood clots. She is having severe lower pelvic pain and low back pain.   States her periods have been regular but this month it was one week late.   States she has PCOS.  She is not on birth control. States she thought she could not get pregnant.   Denies fever, chills, chest pain, palpitations, shortness of breath, N/V/D, urinary symptoms.   States she is prepping for a colonoscopy for tomorrow. Hx of crohns.   Reviewed allergies, medications, past medical, surgical, family, and social history.     Review of Systems  Pertinent positives and negatives in the history of present illness.     Objective:   Physical Exam BP 124/84   Pulse 87   Temp 98.6 F (37 C)   Wt 286 lb (129.7 kg)   LMP 03/19/2020   BMI 44.13 kg/m   Alert and oriented and in no acute distress. She appears uncomfortable and having pain. Examination not done due to patient's emotional distress.       Assessment & Plan:  Threatened miscarriage  Positive pregnancy test - Plan: POCT urine pregnancy  Abnormal uterine bleeding (AUB)  Pelvic pain  Acute bilateral low back pain without sciatica  History of PCOS  Discussed that we ran 2 UPTs and they are both positive. Concerning for threatened miscarriage vs ectopic. I will send her immediately to the [facility] ED. My CMA called and she is expected. I brought her fiance in and explained the situation to him. Recommend she called GI to let them know about her current situation.

## 2020-04-25 NOTE — MAU Provider Note (Addendum)
Patient Barbara Orozco is a 34 y.o. G2P0  at 35w2dhere with complaints of vaginal bleeding and abdominal pain in 5 days. She also endorses some nausea and vomiting and diarrhea, however, patient has Crohn's disease and says that this is a frequent complaint of hers. She has regular periods; last period was 5 weeks ago. She does not use any birth control; she has PCOS and does not think she can get pregnant easily.    History     CSN: [id number] Arrival date and time: 04/25/20 1610   First Provider Initiated Contact with Patient 04/25/20 1811      Chief Complaint  Patient presents with  . Vaginal Bleeding  . Abdominal Pain   Vaginal Bleeding The patient's primary symptoms include vaginal bleeding and vaginal discharge. The patient's pertinent negatives include no genital lesions. This is a new problem. The current episode started in the past 7 days. The problem occurs intermittently. The problem has been waxing and waning. Associated symptoms include abdominal pain, diarrhea, nausea and vomiting. Pertinent negatives include no chills, constipation, dysuria, fever or urgency. The vaginal discharge was bloody. She has been passing clots. She has not been passing tissue. Her past medical history is significant for ovarian cysts.    OB History    Gravida  2   Para      Term      Preterm      AB      Living        SAB      TAB      Ectopic      Multiple      Live Births              Past Medical History:  Diagnosis Date  . Allergy   . Asthma   . Crohn's disease ([place]   . Depression   . GERD (gastroesophageal reflux disease)   . Seizures ([place]    last seizure 1 week ago  . Ulcerative colitis ([facility]     Past Surgical History:  Procedure Laterality Date  . APPENDECTOMY    . HAND SURGERY Left    x3    Family History  Problem Relation Age of Onset  . Colitis Mother   . Ulcerative colitis Mother   . Colitis Father   . Ulcerative colitis Father   . Diabetes  Father   . Heart disease Father   . Colitis Paternal Grandmother   . Ulcerative colitis Paternal Grandmother   . Stomach cancer Paternal Aunt     Social History   Tobacco Use  . Smoking status: Former Smoker    Types: Cigarettes  . Smokeless tobacco: Never Used  Vaping Use  . Vaping Use: Never used  Substance Use Topics  . Alcohol use: Yes    Comment: Ocaasionally  . Drug use: Yes    Types: Marijuana    Comment: last used 02/24/2020    Allergies:  Allergies  Allergen Reactions  . Tramadol Other (See Comments)    Pt has Crohn's  Disease - Tramadol makes her stomach hurt if she has flare -up   . Ibuprofen     Due to Crohn's Disease  . Lithium Rash    Facility-Administered Medications Prior to Admission  Medication Dose Route Frequency Provider Last Rate Last Admin  . 0.9 %  sodium chloride infusion  500 mL Intravenous Once GGatha Mayer MD       Medications Prior to Admission  Medication Sig Dispense  Refill Last Dose  . acetaminophen (TYLENOL) 325 MG tablet Take 650 mg by mouth every 6 (six) hours as needed for headache.   04/25/2020 at 0800  . dicyclomine (BENTYL) 10 MG capsule Take 10 mg by mouth 2 (two) times daily as needed for spasms.    Past Week at Unknown time  . ondansetron (ZOFRAN) 4 MG tablet Take 1 tablet (4 mg total) by mouth every 6 (six) hours. 12 tablet 0 04/24/2020 at 2300  . pantoprazole (PROTONIX) 40 MG tablet Take 1 tablet (40 mg total) by mouth daily. 30 tablet 3 Past Week at Unknown time  . predniSONE (DELTASONE) 10 MG tablet Take 2.5 tablets (25 mg total) by mouth daily with breakfast. 100 tablet 1 04/25/2020 at 0800  . montelukast (SINGULAIR) 10 MG tablet Take 1 tablet (10 mg total) by mouth at bedtime. (Patient not taking: Reported on 04/13/2020) 30 tablet 0     Review of Systems  Constitutional: Negative.  Negative for chills and fever.  HENT: Negative.   Respiratory: Negative.   Cardiovascular: Negative.   Gastrointestinal: Positive for  abdominal pain, diarrhea, nausea and vomiting. Negative for constipation.  Genitourinary: Positive for vaginal bleeding and vaginal discharge. Negative for dysuria and urgency.  Musculoskeletal: Negative.   Neurological: Negative.    Physical Exam   Blood pressure 117/89, pulse 79, temperature 98.3 F (36.8 C), temperature source Oral, resp. rate 20, height 5' 7"  (1.702 m), weight 129.6 kg, last menstrual period 03/19/2020, SpO2 98 %.  Physical Exam  Constitutional: She appears well-developed.  HENT:  Head: Normocephalic.  Respiratory: Effort normal.  GI: Soft. There is abdominal tenderness in the right lower quadrant and left lower quadrant.  Genitourinary: Right adnexum displays tenderness. Left adnexum displays tenderness.    No vaginal discharge or tenderness.  No tenderness in the vagina.    Genitourinary Comments: Patient's sensitive for speculum exam and bimanual exam; no CMT tenderness but overall pain during palpation of uterus and adnexa.    Neurological: She is alert.    MAU Course  Procedures  MDM -US shows right adnexal mass, no fetal pole identified and no free fluid.  -Beta is 65 -Blood Type A pos I have independently reviewed and interpreted the [place] images.  Wet prep negative ; GC CT sent Assessment and Plan   1. Pregnancy of unknown anatomic location   2. Abdominal pain affecting pregnancy    2. Discussed with Dr. Roselie Awkward, who recommends follow up beta hcg in 48 hours as ectopic pregnancy not definitive given low quant and no findings of FP in right adnexal mass.   3. Counseled patient about the need for close follow-up; detailed explanation for need to discern miscarriage vs viable IUP vs. Ectopic. Explained treatment for ectopic and need for possible methotrexate; patient agrees to be available for follow up on Thursday afternoon, appt made for 2 pm on Thursday at [facility]. Detailed instructions on location and what to expect at appointment were given.   4. Strict  ectopic precautions given; patient and husband verbalized understanding; RX for pain given.   5. GC CT cultures pending Mervyn Skeeters [facility] 04/25/2020, 8:49 PM

## 2020-04-25 NOTE — Progress Notes (Signed)
Pre call done. Patient covid test negative. Patient will take meds with a sip of water, instructed by the dr. Gabriel Carina. Nothing to eat past midnight. Patient will arrive to hospital at 9:45 for 11:15 procedure time.

## 2020-04-25 NOTE — Patient Instructions (Signed)
Go to the [facility] emergency department.

## 2020-04-25 NOTE — MAU Note (Signed)
Presents with c/o VB and vaginal pain.  Reports passing clots, quarter size.  LMP approximately May 15.  Seen today @ [place] had +UPT x2.

## 2020-04-25 NOTE — Telephone Encounter (Signed)
I recommend that she most likely cancel unless the OB GYN says it is ok to come for the procedures - it will depend upon what they need to do

## 2020-04-25 NOTE — Discharge Instructions (Signed)
-  return to MAU if bleeding worsens or if pain worsens -keep appt on Thursday at 2 pm-please be available for follow up for at least 2 hours  [address], [place], [place] [address] [phone]   Ectopic Pregnancy  An ectopic pregnancy happens when a fertilized egg grows outside the womb (uterus). The fertilized egg cannot stay alive outside of the womb. This problem often happens in a fallopian tube. It is often caused by damage to the tube. If this problem is found early, you may be treated with medicine that stops the egg from growing. If your tube tears or bursts open (ruptures), you will bleed inside. Often, there is very bad pain in the lower belly. This is an emergency. You will need surgery. Get help right away. Follow these instructions at home: After being treated with medicine or surgery:  Rest and limit your activity for as long as told by your doctor.  Until your doctor says that it is safe: ? Do not lift anything that is heavier than 10 lb (4.5 kg) or the limit that your doctor tells you. ? Avoid exercise and any movement that takes a lot of effort.  To prevent problems when pooping (constipation): ? Eat a healthy diet. This includes:  Fruits.  Vegetables.  Whole grains. ? Drink 6-8 glasses of water a day. Contact a doctor if: Get help right away if:  You have sudden and very bad pain in your belly.  You have very bad pain in your shoulders or neck.  You have pain that gets worse and is not helped by medicine.  You have: ? A fever or chills. ? Vaginal bleeding. ? Redness or swelling at the site of a surgical cut (incision).  You feel sick to your stomach (nauseous) or you throw up (vomit).  You feel dizzy or weak.  You feel light-headed or you pass out (faint). Summary  An ectopic pregnancy happens when a fertilized egg grows outside the womb (uterus).  If this problem is found early, you may be treated with medicine that stops the egg from growing.  If  your tube tears or bursts open (ruptures), you will need surgery. This is an emergency. Get help right away. This information is not intended to replace advice given to you by your health care provider. Make sure you discuss any questions you have with your health care provider. Document Revised: 10/04/2017 Document Reviewed: 11/15/2016 Elsevier Patient Education  2020 [employer].

## 2020-04-26 ENCOUNTER — Other Ambulatory Visit (HOSPITAL_COMMUNITY): Payer: BC Managed Care – PPO

## 2020-04-26 ENCOUNTER — Ambulatory Visit (HOSPITAL_COMMUNITY)
Admission: RE | Admit: 2020-04-26 | Payer: BC Managed Care – PPO | Source: Home / Self Care | Admitting: Internal Medicine

## 2020-04-26 ENCOUNTER — Telehealth: Payer: Self-pay | Admitting: Internal Medicine

## 2020-04-26 ENCOUNTER — Telehealth: Payer: Self-pay | Admitting: Gastroenterology

## 2020-04-26 LAB — GC/CHLAMYDIA PROBE AMP (~~LOC~~) NOT AT ARMC
Chlamydia: NEGATIVE
Comment: NEGATIVE
Comment: NORMAL
Neisseria Gonorrhea: NEGATIVE

## 2020-04-26 SURGERY — COLONOSCOPY WITH PROPOFOL
Anesthesia: Monitor Anesthesia Care

## 2020-04-26 MED ORDER — DICYCLOMINE HCL 10 MG PO CAPS: 10.0000 mg | ORAL_CAPSULE | Freq: Two times a day (BID) | ORAL | 1 refills | Status: DC | PRN

## 2020-04-26 NOTE — Telephone Encounter (Signed)
Pt states she was informed to cancel her colonoscopy scheduled for today at the hospital. Pt states Dr Barbara Orozco informed her to cancel the procedure and pt would like a call back to know how to proceed.

## 2020-04-26 NOTE — Telephone Encounter (Signed)
Called patient and she wants to know if she should continue the Prednisone, since she has had to post-pone her Colonoscopy. Also she is out of her Bentyl (it was prescribed by another practitioner) and wants to know if we will refill it. Please advise

## 2020-04-26 NOTE — Telephone Encounter (Signed)
Still symptomatic with diarrhea, rectal dc and LUQ pain w/ nausea and vomiting.  Has to gf/u OB 6/24 as may have an ectopic pregnancy.  Will refill dicyclomine - need to reschedule colonoscopy and EGd but need to see what is going to happen re: OB issues   Reduce prednisone to 20 mg

## 2020-04-26 NOTE — Telephone Encounter (Signed)
Called patient back and let her know I would send a message to Dr. Celesta Aver nurse, to please put her back on the hospital wait list.

## 2020-04-26 NOTE — Addendum Note (Signed)
Addended by: Gatha Mayer on: 04/26/2020 05:51 PM   Modules accepted: Orders

## 2020-04-26 NOTE — Telephone Encounter (Signed)
Patient called yesterday evening that she is planning to go to hospital for possible miscarriage and she wants to cancel her colonoscopy. She requested not to be charged for late cancellation. Advised her to hold off starting bowel prep and I will forward the message to Dr Carlean Purl and his RN to follow up.

## 2020-04-28 ENCOUNTER — Ambulatory Visit (INDEPENDENT_AMBULATORY_CARE_PROVIDER_SITE_OTHER): Payer: BC Managed Care – PPO

## 2020-04-28 ENCOUNTER — Other Ambulatory Visit: Payer: Self-pay

## 2020-04-28 DIAGNOSIS — O3680X0 Pregnancy with inconclusive fetal viability, not applicable or unspecified: Secondary | ICD-10-CM | POA: Diagnosis not present

## 2020-04-28 LAB — BETA HCG QUANT (REF LAB): hCG Quant: 20 m[IU]/mL

## 2020-04-28 NOTE — Progress Notes (Signed)
Pt here today for STAT Beta Lab for s/p unknown location of pregnancy.  Pt reports that she is still have some scant vaginal bleeding and some pain that radiates from the right side to the front of her belly.  Pt states that it feels like pulling pain in the front.  I informed pt that I will take approximately two hours for [place] to call with results and f/u.  Pt verbalized understanding.    Notified Dr. Jimmye Norman pt's beta results of 20 and pt's sx's.  Provider recommendation is to have patient come to office in one week for a pregnancy test.  Notified pt provider's recommendation and that she needs to just come to the office during office hours to drop of urine for pregnancy test.  I explained to the pt that the pregnancy test should result negative.  Pt stated "okay, but what about what was seen on my tube on the right side from my U/S report."  Reviewed pt's U/S results with Dr. Jimmye Norman-  provider states that it is a cyst that can appear in pregnancy but will resolve and nothing to be concerned about.  Informed pt provider's recommendation.  Pt verbalized understanding with no further questions.    Mel Almond, RN

## 2020-04-29 DIAGNOSIS — Z8616 Personal history of COVID-19: Secondary | ICD-10-CM

## 2020-04-29 HISTORY — DX: Personal history of COVID-19: Z86.16

## 2020-05-02 ENCOUNTER — Telehealth: Payer: Self-pay | Admitting: Family Medicine

## 2020-05-02 NOTE — Telephone Encounter (Signed)
Pt was notified and that she needs an appt but says she can't afford an appointment right now. She has tried dayquil,nquil, perles (from last time she had a cough) and inhaler. She is having a crohn flare up as well that includes vomitting and diarrhea. She thinks she got covid from the hospital. She is having SOB and cough. Please advise

## 2020-05-02 NOTE — Telephone Encounter (Signed)
Pt called and states Vickie saw pt on Monday of last week. On Wednesday she was diagnosed with COVID at a urgent care. She states that she is coughing and would like something sent in gor her. Pt uses [facility] on [address] and can be reached at [phone]

## 2020-05-02 NOTE — Telephone Encounter (Signed)
Left detailed message for pt to go to UC or ER to be evaluated.

## 2020-05-02 NOTE — Telephone Encounter (Signed)
Pt called back and states she is not going to ER or UC and she wanted cough med. I advised her that Loletha Carrow can not give her anything without a virtual visit first. Pt states she has called [facility] where she was seen for covid and waiting on a call. Advised pt she may need fluids since she has been vomitting and diarrhea and can't keep anything down

## 2020-05-02 NOTE — Telephone Encounter (Signed)
It sounds like she may need to go to urgent care or the emergency department for an evaluation. She may need someone to listen to her lungs, see if she needs a chest XR and check her oxygen level. She has already tried several medications which I would recommend and they are not helping.

## 2020-05-02 NOTE — Telephone Encounter (Signed)
Happy to help but I did not see her for her current illness. She will need a visit to discuss medications. Has she tried anything over the counter yet?

## 2020-05-04 ENCOUNTER — Other Ambulatory Visit: Payer: Self-pay

## 2020-05-04 NOTE — Telephone Encounter (Signed)
Had to cancel colon/EGD due to pregnancy/miscarriage  Originally waiting to see if she had an ectopic pregnancy - I see the hCG dropped but cannot see any OB documentation  Now it looks like (from chart review) she has Covid  I need an update re: the OB issues and Covid ? In order to determine when we can do procedures  Would see if we will be able to do my hospital week

## 2020-05-04 NOTE — Telephone Encounter (Signed)
I spoke with the patient.  She remains symptomatic with a cough.  She is requesting help with tessalon pearls .  Her PCP requires an OV and they can't se her.  They instructed her to return to the urgent care and she doesn't have the money to go.    She is due for labs tomorrow, but due to COVID + she can't have the labs.  She reports that her last level was 20 on her HCG and was decreasing from previous readings.    She has been scheduled for an EGD/Colon 8/2 9:00 at [facility].  She is instructed to arrive at 7:30.  She has her instructions at home.  She is asking in addition to the tessalon pearls is there anything she can take for the diarrhea until the procedure in August?

## 2020-05-04 NOTE — Telephone Encounter (Signed)
COVID + on 6/25 at [facility]. Results in [place]. Endo says she must be 14 days after symptom improvement to have a procedure at the [facility] endo.    She is due for repeat HCG tomorrow to confirm her levels have returned to 0.  Left message for patient to call back to get symptom update

## 2020-05-05 MED ORDER — DIPHENOXYLATE-ATROPINE 2.5-0.025 MG PO TABS: 1.0000 | ORAL_TABLET | Freq: Four times a day (QID) | ORAL | 0 refills | Status: DC | PRN

## 2020-05-05 NOTE — Addendum Note (Signed)
Addended by: Gatha Mayer on: 05/05/2020 04:12 PM   Modules accepted: Orders

## 2020-05-05 NOTE — Telephone Encounter (Signed)
I rxed generic lomotil  If she needs cough Tx needs to get that from other provider

## 2020-05-06 NOTE — Telephone Encounter (Signed)
Patient notified of the response

## 2020-05-09 ENCOUNTER — Emergency Department (HOSPITAL_COMMUNITY): Payer: BC Managed Care – PPO

## 2020-05-09 ENCOUNTER — Encounter (HOSPITAL_COMMUNITY): Payer: Self-pay | Admitting: Emergency Medicine

## 2020-05-09 ENCOUNTER — Other Ambulatory Visit: Payer: Self-pay

## 2020-05-09 ENCOUNTER — Emergency Department (HOSPITAL_COMMUNITY)
Admission: EM | Admit: 2020-05-09 | Discharge: 2020-05-09 | Disposition: A | Discharge status: Home / Self Care | Payer: BC Managed Care – PPO | Attending: Emergency Medicine | Admitting: Emergency Medicine

## 2020-05-09 DIAGNOSIS — R569 Unspecified convulsions: Secondary | ICD-10-CM

## 2020-05-09 DIAGNOSIS — R519 Headache, unspecified: Secondary | ICD-10-CM | POA: Diagnosis not present

## 2020-05-09 DIAGNOSIS — Z79899 Other long term (current) drug therapy: Secondary | ICD-10-CM | POA: Insufficient documentation

## 2020-05-09 DIAGNOSIS — Z87891 Personal history of nicotine dependence: Secondary | ICD-10-CM | POA: Diagnosis not present

## 2020-05-09 DIAGNOSIS — G4489 Other headache syndrome: Secondary | ICD-10-CM | POA: Diagnosis not present

## 2020-05-09 DIAGNOSIS — J45909 Unspecified asthma, uncomplicated: Secondary | ICD-10-CM | POA: Diagnosis not present

## 2020-05-09 DIAGNOSIS — R05 Cough: Secondary | ICD-10-CM | POA: Diagnosis not present

## 2020-05-09 DIAGNOSIS — J90 Pleural effusion, not elsewhere classified: Secondary | ICD-10-CM | POA: Diagnosis not present

## 2020-05-09 DIAGNOSIS — R1110 Vomiting, unspecified: Secondary | ICD-10-CM | POA: Diagnosis not present

## 2020-05-09 DIAGNOSIS — R457 State of emotional shock and stress, unspecified: Secondary | ICD-10-CM | POA: Diagnosis not present

## 2020-05-09 DIAGNOSIS — R52 Pain, unspecified: Secondary | ICD-10-CM | POA: Diagnosis not present

## 2020-05-09 DIAGNOSIS — R112 Nausea with vomiting, unspecified: Secondary | ICD-10-CM | POA: Diagnosis not present

## 2020-05-09 DIAGNOSIS — G40909 Epilepsy, unspecified, not intractable, without status epilepticus: Secondary | ICD-10-CM | POA: Diagnosis not present

## 2020-05-09 LAB — COMPREHENSIVE METABOLIC PANEL
ALT: 23 U/L (ref 0–44)
AST: 20 U/L (ref 15–41)
Albumin: 3.9 g/dL (ref 3.5–5.0)
Alkaline Phosphatase: 57 U/L (ref 38–126)
Anion gap: 11 (ref 5–15)
BUN: 13 mg/dL (ref 6–20)
CO2: 22 mmol/L (ref 22–32)
Calcium: 8.9 mg/dL (ref 8.9–10.3)
Chloride: 102 mmol/L (ref 98–111)
Creatinine, Ser: 0.65 mg/dL (ref 0.44–1.00)
GFR calc Af Amer: >60 mL/min (ref >60)
GFR calc non Af Amer: >60 mL/min (ref >60)
Glucose, Bld: 103 mg/dL — ABNORMAL HIGH (ref 70–99)
Potassium: 4.3 mmol/L (ref 3.5–5.1)
Sodium: 135 mmol/L (ref 135–145)
Total Bilirubin: 0.5 mg/dL (ref 0.3–1.2)
Total Protein: 6.9 g/dL (ref 6.5–8.1)

## 2020-05-09 LAB — URINALYSIS, ROUTINE W REFLEX MICROSCOPIC
Bilirubin Urine: NEGATIVE
Glucose, UA: NEGATIVE mg/dL
Ketones, ur: NEGATIVE mg/dL
Leukocytes,Ua: NEGATIVE
Nitrite: NEGATIVE
Protein, ur: NEGATIVE mg/dL
Specific Gravity, Urine: 1.002 — ABNORMAL LOW (ref 1.005–1.030)
pH: 7 (ref 5.0–8.0)

## 2020-05-09 LAB — CBC
HCT: 44.8 % (ref 36.0–46.0)
Hemoglobin: 14.4 g/dL (ref 12.0–15.0)
MCH: 28.9 pg (ref 26.0–34.0)
MCHC: 32.1 g/dL (ref 30.0–36.0)
MCV: 90 fL (ref 80.0–100.0)
Platelets: 281 10*3/uL (ref 150–400)
RBC: 4.98 MIL/uL (ref 3.87–5.11)
RDW: 13.2 % (ref 11.5–15.5)
WBC: 12.1 10*3/uL — ABNORMAL HIGH (ref 4.0–10.5)
nRBC: 0 % (ref 0.0–0.2)

## 2020-05-09 LAB — RAPID URINE DRUG SCREEN, HOSP PERFORMED
Amphetamines: NOT DETECTED
Barbiturates: NOT DETECTED
Benzodiazepines: NOT DETECTED
Cocaine: NOT DETECTED
Opiates: NOT DETECTED
Tetrahydrocannabinol: POSITIVE — AB

## 2020-05-09 LAB — LIPASE, BLOOD: Lipase: 37 U/L (ref 11–51)

## 2020-05-09 LAB — HCG, QUANTITATIVE, PREGNANCY: hCG, Beta Chain, Quant, S: 2 m[IU]/mL (ref <5)

## 2020-05-09 MED ORDER — ONDANSETRON HCL 4 MG/2ML IJ SOLN
4.0000 mg | Freq: Once | INTRAMUSCULAR | Status: AC
  Administered 2020-05-09: 4 mg via INTRAVENOUS
  Filled 2020-05-09: qty 2

## 2020-05-09 MED ORDER — SODIUM CHLORIDE 0.9% FLUSH
3.0000 mL | Freq: Once | INTRAVENOUS | Status: AC
  Administered 2020-05-09: 3 mL via INTRAVENOUS

## 2020-05-09 MED ORDER — ONDANSETRON HCL 4 MG/2ML IJ SOLN
4.0000 mg | Freq: Once | INTRAMUSCULAR | Status: AC | PRN
  Administered 2020-05-09: 4 mg via INTRAVENOUS
  Filled 2020-05-09: qty 2

## 2020-05-09 MED ORDER — KETOROLAC TROMETHAMINE 30 MG/ML IJ SOLN
30.0000 mg | Freq: Once | INTRAMUSCULAR | Status: AC
  Administered 2020-05-09: 30 mg via INTRAVENOUS
  Filled 2020-05-09: qty 1

## 2020-05-09 NOTE — ED Notes (Signed)
EMS states patient showed no signs of being postical

## 2020-05-09 NOTE — ED Provider Notes (Addendum)
TIME SEEN: 5:28 AM   CHIEF COMPLAINT: possible seizure, headache and vomiting  HPI: Patient is a 34 year old female with history of inflammatory bowel disease, obesity who presents to the emergency department with concerns that she may have had a seizure.  Patient reports that she woke up on her couch and had diffuse throbbing headache and began vomiting.  She states that she thinks she may have had a seizure as she was incontinent of urine and stool.  This episode was unwitnessed.  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  He states these episodes last for 20 to 30 seconds and she will appear to have "muscle spasms" all over.  He reports that she will be out of it for about 30 seconds and then come back to and be oriented and able to talk.  She is not on any antiepileptics.  She has not followed up yet with a neurologist as she states she has not been able to get an appointment.  She tells me today that her symptoms seem to have been getting worse since being diagnosed with COVID-19.  She had a positive test for COVID-19 on 04/29/2020 after being positive for 4 days.  No chest pain or shortness of breath.  No numbness, tingling or focal weakness.  Is complaining of diffuse throbbing headache with photophobia.  Requesting something for nausea.   ROS: See HPI Constitutional: no fever  Eyes: no drainage  ENT: no runny nose   Cardiovascular:  no chest pain  Resp: no SOB  GI:  vomiting GU: no dysuria Integumentary: no rash  Allergy: no hives  Musculoskeletal: no leg swelling  Neurological: no slurred speech ROS otherwise negative  PAST MEDICAL HISTORY/PAST SURGICAL HISTORY:  Past Medical History:  Diagnosis Date  . Allergy   . Asthma   . Crohn's disease ([place])   . Depression   . GERD (gastroesophageal reflux disease)   . Seizures ([place])    last seizure 1 week ago  . Ulcerative colitis ([place])     MEDICATIONS:   Prior to Admission medications   Medication Sig Start Date End Date Taking? Authorizing Provider  acetaminophen (TYLENOL) 325 MG tablet Take 650 mg by mouth every 6 (six) hours as needed for headache.    [provider]  dicyclomine (BENTYL) 10 MG capsule Take 1 capsule (10 mg total) by mouth 2 (two) times daily as needed for spasms. 04/26/20   Gatha Mayer, MD  diphenoxylate-atropine (LOMOTIL) 2.5-0.025 MG tablet Take 1 tablet by mouth 4 (four) times daily as needed for diarrhea or loose stools. 05/05/20   Gatha Mayer, MD  montelukast (SINGULAIR) 10 MG tablet Take 1 tablet (10 mg total) by mouth at bedtime. Patient not taking: Reported on 04/13/2020 02/10/20   Harland Dingwall L, NP-C  ondansetron (ZOFRAN) 4 MG tablet Take 1 tablet (4 mg total) by mouth every 6 (six) hours. 09/27/17   Orpah Greek, MD  oxycodone-acetaminophen (PERCOCET) 2.5-325 MG tablet Take 1 tablet by mouth every 4 (four) hours as needed for pain. 04/25/20   Starr Lake, CNM  oxyCODONE-acetaminophen (PERCOCET) 7.5-325 MG tablet Take 1 tablet by mouth every 4 (four) hours as needed for severe pain. 04/25/20   Starr Lake, CNM  pantoprazole (PROTONIX) 40 MG tablet Take 1 tablet (40 mg total) by mouth daily. 01/22/20   Henson, Vickie L, NP-C  predniSONE (DELTASONE) 10 MG tablet Take 2 tablets (  20 mg total) by mouth daily with breakfast. 04/26/20   Gatha Mayer, MD    ALLERGIES:  Allergies  Allergen Reactions  . Tramadol Other (See Comments) and Rash    Pt has Crohn's  Disease - Tramadol makes her stomach hurt if she has flare -up  Pt has Crohn's  Disease - Tramadol makes her stomach hurt if she has flare -up   . Ibuprofen     Due to Crohn's Disease  . Lithium Rash    Other Reaction: Not Assessed    SOCIAL HISTORY:  Social History   Tobacco Use  . Smoking status: Former Smoker    Types: Cigarettes  . Smokeless tobacco: Never Used  Substance Use Topics  . Alcohol use:  Yes    Comment: Ocaasionally    FAMILY HISTORY: Family History  Problem Relation Age of Onset  . Colitis Mother   . Ulcerative colitis Mother   . Colitis Father   . Ulcerative colitis Father   . Diabetes Father   . Heart disease Father   . Colitis Paternal Grandmother   . Ulcerative colitis Paternal Grandmother   . Stomach cancer Paternal Aunt     EXAM: BP (!) 139/102 (BP Location: Left Arm)   Pulse 71   Temp (!) 97.4 F (36.3 C) (Oral)   Resp (!) 28   Ht 5' 7"  (1.702 m)   Wt 129.6 kg   LMP 03/19/2020   SpO2 98%   Breastfeeding Unknown   BMI 44.76 kg/m  CONSTITUTIONAL: Alert and oriented and responds appropriately to questions.  Obese, appears uncomfortable, afebrile, nontoxic-appearing; patient not wearing a mask during H&P HEAD: Normocephalic EYES: Conjunctivae clear, pupils appear equal, EOM appear intact ENT: normal nose; moist mucous membranes NECK: Supple, normal ROM CARD: RRR; S1 and S2 appreciated; no murmurs, no clicks, no rubs, no gallops RESP: Normal chest excursion without splinting or tachypnea; breath sounds clear and equal bilaterally; no wheezes, no rhonchi, no rales, no hypoxia or respiratory distress, speaking full sentences ABD/GI: Normal bowel sounds; non-distended; soft, non-tender, no rebound, no guarding, no peritoneal signs, no hepatosplenomegaly BACK:  The back appears normal EXT: Normal ROM in all joints; no deformity noted, no edema; no cyanosis SKIN: Normal color for age and race; warm; no rash on exposed skin NEURO: Moves all extremities equally, normal speech, cranial nerves II through XII intact, normal sensation diffusely PSYCH: Histrionic behavior.  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  I do not feel that she needs to be started on antiepileptics even if this was her first true epileptic seizure.  Have again encouraged her to follow-up with neurology.  Patient is complaining of a  headache and nausea and vomiting.  She has no focal neurologic deficits today.  No fever or meningismus.  Will give Toradol, Zofran.  She states she has had head imaging before but I do not see this in her system.  Will obtain head CT today given she reports new onset headache.  Headache started just prior to arrival.  If CT negative, I do not feel she needs LP to rule out bleed.  Low suspicion for meningitis.  Patient also reports symptoms have been getting worse since being diagnosed with COVID-19 10 days ago on 04/30/19 (can see in Care Everywhere).  Given she is still within the infectious window, have urged patient to keep her mask on and will place her on airborne and contact precautions.  Will obtain labs, urine, chest x-ray.  ED PROGRESS: Labs unremarkable other than mild leukocytosis.  Chest x-ray clear.  Head CT negative.  Urine pending.  Signed out to Dr. Wilson Singer.  Anticipate discharge home with outpatient neurology follow-up.  It appears on review of records, she has well documented h/o similar seizures and migraine headaches.  Also has neuro appt scheduled 06/10/20 per recent GI note.    Yatzil Silba was evaluated in Emergency Department on 05/09/2020 for the symptoms described in the history of present illness. She was evaluated in the context of the global COVID-19 pandemic, which necessitated consideration that the patient might be at risk for infection with the SARS-CoV-2 virus that causes COVID-19. Institutional protocols and algorithms that pertain to the evaluation of patients at risk for COVID-19 are in a state of rapid change based on information released by regulatory bodies including the CDC and federal and state organizations. These policies and algorithms were followed during the patient's care in the ED.        Javonta Gronau, Delice Bison, DO 05/09/20 2321

## 2020-05-09 NOTE — ED Notes (Signed)
Pt just now telling staff that she tested positive for COVID on 6/20 at [facility]. Pt removed her mask in the room. She was asked to keep it on and was given a new one. COVID precautions initiated.

## 2020-05-09 NOTE — ED Notes (Addendum)
Patient reports she still has a headache. EDP made aware. Patient upset stating she did not talk to a provider. EDP made aware.    Discharge instructions reviewed with patient. This RN asked patient if she would like to see a provider and patient states no she is leaving.   Patient experience number given to patient.

## 2020-05-09 NOTE — ED Triage Notes (Addendum)
Patient is complaining of having a seizure. While she was having a seizure she hit her head. Patient states since covid her seizures are getting worse. Patient states she had a miscarriage two weeks ago. Patient is not on any medication for her seizures.

## 2020-05-31 ENCOUNTER — Other Ambulatory Visit: Payer: Self-pay | Admitting: Internal Medicine

## 2020-05-31 NOTE — Telephone Encounter (Signed)
Pt is requesting a call back from a nurse in regards to her rectal bleeding. Pt is requesting some antibiotics if possible since she had a procedure scheduled with Dr Carlean Purl on Monday 8/2 which she says she cannot hold off till then.  Pt is at work and states it is ok to leave a msg on her vm

## 2020-05-31 NOTE — Telephone Encounter (Signed)
Patient of Dr Celesta Aver who has been trying to have her colonoscopy since earlier this year calls today with complaints of a bloody pus filled drainage coming from the perianal area. She has a history of intermittent perianal abscesses requiring antibiotics. She can feel a "knot" in the "usual area" and recognizes the symptoms as her previous abscesses "bursting." Afebrile. Tenderness and discomfort. Her colonoscopy is 06/06/20 with Dr Carlean Purl. She is not comfortable waiting. Please advise. Thanks

## 2020-06-03 ENCOUNTER — Encounter (HOSPITAL_COMMUNITY): Payer: Self-pay | Admitting: Internal Medicine

## 2020-06-03 ENCOUNTER — Other Ambulatory Visit: Payer: Self-pay

## 2020-06-03 MED ORDER — AMOXICILLIN-POT CLAVULANATE 875-125 MG PO TABS: 1.0000 | ORAL_TABLET | Freq: Two times a day (BID) | ORAL | 0 refills | Status: AC

## 2020-06-03 NOTE — Progress Notes (Addendum)
Patient was Covid + in July, results were from a [facility] urgent care.  Patient said that Dr Carlean Purl has seen the results but other people have had difficulty accessing.  This nurse contact [facility] and see if they have a copy of results.  Patient has no symptoms now and will not be retested for 90 days.    [facility] does not have a copy of lab results.

## 2020-06-03 NOTE — Telephone Encounter (Signed)
Please refill Augmentin 875 mg bid x 14 days. Thanks

## 2020-06-06 ENCOUNTER — Encounter (HOSPITAL_COMMUNITY)
Admission: RE | Disposition: A | Discharge status: Home / Self Care | Payer: Self-pay | Source: Home / Self Care | Attending: Internal Medicine

## 2020-06-06 ENCOUNTER — Ambulatory Visit (HOSPITAL_COMMUNITY)
Admission: RE | Admit: 2020-06-06 | Discharge: 2020-06-06 | Disposition: A | Discharge status: Home / Self Care | Payer: BC Managed Care – PPO | Attending: Internal Medicine | Admitting: Internal Medicine

## 2020-06-06 ENCOUNTER — Encounter (HOSPITAL_COMMUNITY): Payer: Self-pay | Admitting: Internal Medicine

## 2020-06-06 ENCOUNTER — Ambulatory Visit (HOSPITAL_COMMUNITY): Payer: BC Managed Care – PPO | Admitting: Certified Registered Nurse Anesthetist

## 2020-06-06 ENCOUNTER — Other Ambulatory Visit: Payer: Self-pay

## 2020-06-06 DIAGNOSIS — Z886 Allergy status to analgesic agent status: Secondary | ICD-10-CM | POA: Diagnosis not present

## 2020-06-06 DIAGNOSIS — R197 Diarrhea, unspecified: Secondary | ICD-10-CM | POA: Diagnosis not present

## 2020-06-06 DIAGNOSIS — F329 Major depressive disorder, single episode, unspecified: Secondary | ICD-10-CM | POA: Insufficient documentation

## 2020-06-06 DIAGNOSIS — Z885 Allergy status to narcotic agent status: Secondary | ICD-10-CM | POA: Diagnosis not present

## 2020-06-06 DIAGNOSIS — K6289 Other specified diseases of anus and rectum: Secondary | ICD-10-CM | POA: Insufficient documentation

## 2020-06-06 DIAGNOSIS — Z7952 Long term (current) use of systemic steroids: Secondary | ICD-10-CM | POA: Insufficient documentation

## 2020-06-06 DIAGNOSIS — R1012 Left upper quadrant pain: Secondary | ICD-10-CM | POA: Diagnosis not present

## 2020-06-06 DIAGNOSIS — K5190 Ulcerative colitis, unspecified, without complications: Secondary | ICD-10-CM | POA: Diagnosis not present

## 2020-06-06 DIAGNOSIS — K219 Gastro-esophageal reflux disease without esophagitis: Secondary | ICD-10-CM | POA: Diagnosis not present

## 2020-06-06 DIAGNOSIS — Z6841 Body Mass Index (BMI) 40.0 and over, adult: Secondary | ICD-10-CM | POA: Insufficient documentation

## 2020-06-06 DIAGNOSIS — Z87891 Personal history of nicotine dependence: Secondary | ICD-10-CM | POA: Diagnosis not present

## 2020-06-06 DIAGNOSIS — J45909 Unspecified asthma, uncomplicated: Secondary | ICD-10-CM | POA: Diagnosis not present

## 2020-06-06 DIAGNOSIS — Z8711 Personal history of peptic ulcer disease: Secondary | ICD-10-CM | POA: Insufficient documentation

## 2020-06-06 DIAGNOSIS — R112 Nausea with vomiting, unspecified: Secondary | ICD-10-CM | POA: Diagnosis not present

## 2020-06-06 DIAGNOSIS — Z8616 Personal history of COVID-19: Secondary | ICD-10-CM | POA: Insufficient documentation

## 2020-06-06 DIAGNOSIS — K51911 Ulcerative colitis, unspecified with rectal bleeding: Secondary | ICD-10-CM | POA: Diagnosis not present

## 2020-06-06 DIAGNOSIS — Z79899 Other long term (current) drug therapy: Secondary | ICD-10-CM | POA: Diagnosis not present

## 2020-06-06 DIAGNOSIS — J4520 Mild intermittent asthma, uncomplicated: Secondary | ICD-10-CM | POA: Diagnosis not present

## 2020-06-06 HISTORY — PX: BIOPSY: SHX5522

## 2020-06-06 HISTORY — PX: COLONOSCOPY WITH PROPOFOL: SHX5780

## 2020-06-06 HISTORY — PX: ESOPHAGOGASTRODUODENOSCOPY (EGD) WITH PROPOFOL: SHX5813

## 2020-06-06 SURGERY — COLONOSCOPY WITH PROPOFOL
Anesthesia: Monitor Anesthesia Care

## 2020-06-06 MED ORDER — LACTATED RINGERS IV SOLN: INTRAVENOUS | Status: DC

## 2020-06-06 MED ORDER — PROPOFOL 10 MG/ML IV BOLUS
INTRAVENOUS | Status: DC | PRN
  Administered 2020-06-06: 30 mg via INTRAVENOUS

## 2020-06-06 MED ORDER — ONDANSETRON HCL 4 MG/2ML IJ SOLN
INTRAMUSCULAR | Status: DC | PRN
  Administered 2020-06-06: 4 mg via INTRAVENOUS

## 2020-06-06 MED ORDER — MIDAZOLAM HCL 2 MG/2ML IJ SOLN
INTRAMUSCULAR | Status: AC
  Filled 2020-06-06: qty 2

## 2020-06-06 MED ORDER — PROPOFOL 500 MG/50ML IV EMUL
INTRAVENOUS | Status: DC | PRN
  Administered 2020-06-06: 145 ug/kg/min via INTRAVENOUS

## 2020-06-06 MED ORDER — DEXAMETHASONE SODIUM PHOSPHATE 10 MG/ML IJ SOLN
INTRAMUSCULAR | Status: DC | PRN
  Administered 2020-06-06: 10 mg via INTRAVENOUS

## 2020-06-06 MED ORDER — MIDAZOLAM HCL 2 MG/2ML IJ SOLN
INTRAMUSCULAR | Status: DC | PRN
Start: 2020-06-06 — End: 2020-06-06
  Administered 2020-06-06: 1 mg via INTRAVENOUS

## 2020-06-06 SURGICAL SUPPLY — 24 items

## 2020-06-06 NOTE — Op Note (Signed)
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 Estimated Blood Loss:     Estimated blood loss was minimal. Procedure:                Pre-Anesthesia Assessment:                           - Prior to the procedure, a History and Physical                            was performed, and patient medications and                            allergies were reviewed. The patient's tolerance of                            previous anesthesia was also reviewed. The risks                            and benefits of the procedure and the sedation                            options and risks were discussed with the patient.                            All questions were answered, and informed consent                            was obtained. Prior Anticoagulants: The patient has                            taken no previous anticoagulant or antiplatelet                            agents. ASA Grade Assessment: III - A patient with  severe systemic disease. After reviewing the risks                            and benefits, the patient was deemed in                            satisfactory condition to undergo the procedure.                           After obtaining informed consent, the colonoscope                            was passed under  direct vision. Throughout the                            procedure, the patient's blood pressure, pulse, and                            oxygen saturations were monitored continuously. The                            CF-HQ190L ([id number]) Olympus colonoscope was                            introduced through the anus and advanced to the the                            terminal ileum, with identification of the                            appendiceal orifice and IC valve. The colonoscopy                            was performed without difficulty. The patient                            tolerated the procedure well. The quality of the                            bowel preparation was excellent. The terminal                            ileum, ileocecal valve, appendiceal orifice, and                            rectum were photographed. Scope In: 9:34:23 AM Scope Out: 9:41:12 AM Scope Withdrawal Time: 0 hours 4 minutes 52 seconds  Total Procedure Duration: 0 hours 6 minutes 49 seconds  Findings:      The perianal exam findings include induration/SQ tiny nodule posterior.      The digital rectal exam was normal.      The terminal ileum appeared normal. Biopsies were taken with a cold       forceps for histology. Verification of patient identification for the       specimen was  done. Estimated blood loss was minimal.      The colon (entire examined portion) appeared normal. Biopsies were taken       with a cold forceps for histology.      No additional abnormalities were found on retroflexion. Impression:               - Induration/SQ tiny nodule posterior found on                            perianal exam. No active abscesses                           - The examined portion of the ileum was normal.                            Biopsied.                           - The entire examined colon is normal. Biopsied. Moderate Sedation:      Not Applicable - Patient had care per Anesthesia. Recommendation:            - Patient has a contact number available for                            emergencies. The signs and symptoms of potential                            delayed complications were discussed with the                            patient. Return to normal activities tomorrow.                            Written discharge instructions were provided to the                            patient.                           - Resume previous diet.                           - Continue present medications.                           - Await pathology results. ? treated UC (is on                            prednisone) ? IBS and not UC                           - No recommendation at this time regarding repeat                            colonoscopy. Procedure Code(s):        --- Professional ---  45380, Colonoscopy, flexible; with biopsy, single                            or multiple Diagnosis Code(s):        --- Professional ---                           R19.7, Diarrhea, unspecified CPT copyright 2019 American Medical Association. All rights reserved. The codes documented in this report are preliminary and upon coder review may  be revised to meet current compliance requirements. Gatha Mayer, MD 06/06/2020 10:01:29 AM This report has been signed electronically. Number of Addenda: 0

## 2020-06-06 NOTE — Anesthesia Procedure Notes (Addendum)
Procedure Name: MAC Date/Time: 06/06/2020 9:17 AM Performed by: West Pugh, CRNA Pre-anesthesia Checklist: Patient identified, Emergency Drugs available, Suction available, Patient being monitored and Timeout performed Patient Re-evaluated:Patient Re-evaluated prior to induction Oxygen Delivery Method: Simple face mask Preoxygenation: Pre-oxygenation with 100% oxygen Placement Confirmation: positive ETCO2 Dental Injury: Teeth and Oropharynx as per pre-operative assessment  Comments: Bite block carefully placed by endo RN

## 2020-06-06 NOTE — H&P (Signed)
[facility] History and Physical   Primary Care Physician:  Girtha Rm, NP-C   Reason for Procedure:   evaluate LUQ pain, history of ulcerative colitis, diarrhea  Plan:    EGD and colonoscopy     HPI: Barbara Orozco is a 34 y.o. female seen in clinic w/ LUQ pain and diarrhea, perianal abscesses in setting of reported hx UC. Better but not resolved on prednisone. For endoscopic evaluation.  See 03/29/2020 note from Tye Savoy NP also  Past Medical History:  Diagnosis Date  . Allergy   . Asthma   . Crohn's disease ([place])   . Depression   . GERD (gastroesophageal reflux disease)   . History of 2019 novel coronavirus disease (COVID-19) 04/2020  . Seizures ([place])    last seizure 1 week ago  . Ulcerative colitis [facility])     Past Surgical History:  Procedure Laterality Date  . APPENDECTOMY    . HAND SURGERY Left    x3    Prior to Admission medications   Medication Sig Start Date End Date Taking? Authorizing Provider  acetaminophen (TYLENOL) 325 MG tablet Take 650 mg by mouth every 6 (six) hours as needed for moderate pain or headache.   Yes [provider]  dicyclomine (BENTYL) 10 MG capsule Take 1 capsule (10 mg total) by mouth 2 (two) times daily as needed for spasms. 04/26/20  Yes Gatha Mayer, MD  Multiple Vitamin (MULTIVITAMIN WITH MINERALS) TABS tablet Take 1 tablet by mouth daily.   Yes [provider]  ondansetron (ZOFRAN-ODT) 8 MG disintegrating tablet Take 8 mg by mouth every 8 (eight) hours as needed for nausea or vomiting.   Yes [provider]  pantoprazole (PROTONIX) 40 MG tablet Take 1 tablet (40 mg total) by mouth daily. 01/22/20  Yes Henson, Vickie L, NP-C  predniSONE (DELTASONE) 10 MG tablet Take 2 tablets (20 mg total) by mouth daily with breakfast. 04/26/20  Yes Gatha Mayer, MD  albuterol (VENTOLIN HFA) 108 (90 Base) MCG/ACT inhaler Inhale 2 puffs into the lungs every 4 (four) hours as needed for shortness of  breath. 04/30/20   [provider]  amoxicillin-clavulanate (AUGMENTIN) 875-125 MG tablet Take 1 tablet by mouth 2 (two) times daily for 14 days. 06/03/20 06/17/20  Willia Craze, NP  diphenoxylate-atropine (LOMOTIL) 2.5-0.025 MG tablet Take 1 tablet by mouth 4 (four) times daily as needed for diarrhea or loose stools. Patient not taking: Reported on 05/09/2020 05/05/20   Gatha Mayer, MD  montelukast (SINGULAIR) 10 MG tablet Take 1 tablet (10 mg total) by mouth at bedtime. Patient taking differently: Take 10 mg by mouth daily as needed (allergies).  02/10/20   Henson, Vickie L, NP-C  oxycodone-acetaminophen (PERCOCET) 2.5-325 MG tablet Take 1 tablet by mouth every 4 (four) hours as needed for pain. Patient not taking: Reported on 05/09/2020 04/25/20   Starr Lake, CNM    Current Facility-Administered Medications  Medication Dose Route Frequency Provider Last Rate Last Admin  . lactated ringers infusion   Intravenous Continuous Gatha Mayer, MD 20 mL/hr at 06/06/20 9833 Continued from Pre-op at 06/06/20 0858    Allergies as of 05/04/2020 - Review Complete 04/25/2020  Allergen Reaction Noted  . Tramadol Other (See Comments) 03/31/2017  . Ibuprofen  03/31/2017  . Lithium Rash 03/31/2017    Family History  Problem Relation Age of Onset  . Colitis Mother   . Ulcerative colitis Mother   . Colitis Father   . Ulcerative colitis  Father   . Diabetes Father   . Heart disease Father   . Colitis Paternal Grandmother   . Ulcerative colitis Paternal Grandmother   . Stomach cancer Paternal Aunt       Review of Systems: Positive for  All other review of systems negative except as mentioned in the HPI.  Physical Exam: Vital signs in last 24 hours: Temp:  [98.5 F (36.9 C)] 98.5 F (36.9 C) (08/02 0828) Pulse Rate:  [74] 74 (08/02 0828) Resp:  [17] 17 (08/02 0828) BP: (158)/(99) 158/99 (08/02 0828) SpO2:  [100 %] 100 % (08/02 0828)   General:   Alert,   Well-developed, well-nourished, pleasant and cooperative in NAD Lungs:  Clear throughout to auscultation.   Heart:  Regular rate and rhythm; no murmurs, clicks, rubs,  or gallops. Abdomen:  Soft, nontender and nondistended. Normal bowel sounds.   Neuro/Psych:  Alert and cooperative. Normal mood and affect. A and O x 3   @Danialle Dement  Simonne Maffucci, MD, [facility] [phone] (pager) 06/06/2020 9:10 AM@

## 2020-06-06 NOTE — Transfer of Care (Signed)
Immediate Anesthesia Transfer of Care Note  Patient: Barbara Orozco  Procedure(s) Performed: COLONOSCOPY WITH PROPOFOL (N/A ) ESOPHAGOGASTRODUODENOSCOPY (EGD) WITH PROPOFOL (N/A )  Patient Location: PACU and Endoscopy Unit  Anesthesia Type:MAC  Level of Consciousness: patient awake, crying and not allowing staff to place monitors  Airway & Oxygen Therapy: Patient Spontanous Breathing  Post-op Assessment: Report given to RN  Post vital signs: Unable to obtain VS. Patient pink, warm and is communicating with staff  Last Vitals:  Vitals Value Taken Time  BP    Temp    Pulse    Resp    SpO2      Last Pain:  Vitals:   06/06/20 0828  TempSrc: Oral  PainSc: 0-No pain         Complications: No complications documented.

## 2020-06-06 NOTE — Anesthesia Postprocedure Evaluation (Signed)
Anesthesia Post Note  Patient: Barbara Orozco  Procedure(s) Performed: COLONOSCOPY WITH PROPOFOL (N/A ) ESOPHAGOGASTRODUODENOSCOPY (EGD) WITH PROPOFOL (N/A ) BIOPSY     Patient location during evaluation: Endoscopy Anesthesia Type: MAC Level of consciousness: awake and alert Pain management: pain level controlled Vital Signs Assessment: post-procedure vital signs reviewed and stable Respiratory status: spontaneous breathing, nonlabored ventilation, respiratory function stable and patient connected to nasal cannula oxygen Cardiovascular status: blood pressure returned to baseline and stable Postop Assessment: no apparent nausea or vomiting Anesthetic complications: no   No complications documented.  Last Vitals:  Vitals:   06/06/20 0828 06/06/20 1012  BP: (!) 158/99 (!) 113/93  Pulse: 74 86  Resp: 17 15  Temp: 36.9 C 36.9 C  SpO2: 100% 100%    Last Pain:  Vitals:   06/06/20 1012  TempSrc: Oral  PainSc: 4                  Amous Crewe L Sheronica Corey

## 2020-06-06 NOTE — Discharge Instructions (Signed)
The esophagus, stomach and upper intestine were all normal. The end of the small intestine, colon and rectum were all normal. Biopsies taken here.  Once I see the biopsy results will contact you later this week with plans.  I appreciate the opportunity to care for you. Gatha Mayer, MD, FACG  YOU HAD AN ENDOSCOPIC PROCEDURE TODAY: Refer to the procedure report and other information in the discharge instructions given to you for any specific questions about what was found during the examination. If this information does not answer your questions, please call Dr. Celesta Aver office at [phone] to clarify.   YOU SHOULD EXPECT: Some feelings of bloating in the abdomen. Passage of more gas than usual. Walking can help get rid of the air that was put into your GI tract during the procedure and reduce the bloating. If you had a lower endoscopy (such as a colonoscopy or flexible sigmoidoscopy) you may notice spotting of blood in your stool or on the toilet paper. Some abdominal soreness may be present for a day or two, also.  DIET: Your first meal following the procedure should be a light meal and then it is ok to progress to your normal diet. A half-sandwich or bowl of soup is an example of a good first meal. Heavy or fried foods are harder to digest and may make you feel nauseous or bloated. Drink plenty of fluids but you should avoid alcoholic beverages for 24 hours.   ACTIVITY: Your care partner should take you home directly after the procedure. You should plan to take it easy, moving slowly for the rest of the day. You can resume normal activity the day after the procedure however YOU SHOULD NOT DRIVE, use power tools, machinery or perform tasks that involve climbing or major physical exertion for 24 hours (because of the sedation medicines used during the test).   SYMPTOMS TO REPORT IMMEDIATELY: A gastroenterologist can be reached at any hour. Please call [phone]  for any of the following  symptoms:  Following lower endoscopy (colonoscopy, flexible sigmoidoscopy) Excessive amounts of blood in the stool  Significant tenderness, worsening of abdominal pains  Swelling of the abdomen that is new, acute  Fever of 100 or higher  Following upper endoscopy (EGD, EUS, ERCP, esophageal dilation) Vomiting of blood or coffee ground material  New, significant abdominal pain  New, significant chest pain or pain under the shoulder blades  Painful or persistently difficult swallowing  New shortness of breath  Black, tarry-looking or red, bloody stools

## 2020-06-06 NOTE — Anesthesia Preprocedure Evaluation (Addendum)
Anesthesia Evaluation  Patient identified by MRN, date of birth, ID band Patient awake    Reviewed: Allergy & Precautions, NPO status , Patient's Chart, lab work & pertinent test results  Airway Mallampati: I  TM Distance: >3 FB Neck ROM: Full    Dental  (+) Poor Dentition, Dental Advisory Given   Pulmonary asthma , former smoker,  Covid positive 04/2020   Pulmonary exam normal breath sounds clear to auscultation       Cardiovascular negative cardio ROS Normal cardiovascular exam Rhythm:Regular Rate:Normal     Neuro/Psych Seizures - (not on anti-epileptics, last one 1 month ago), Poorly Controlled,  PSYCHIATRIC DISORDERS Depression    GI/Hepatic Neg liver ROS, PUD, GERD  Controlled and Medicated,H/o IBD on prednisone 43m qd    Endo/Other  Morbid obesity (BMI 45)  Renal/GU negative Renal ROS  negative genitourinary   Musculoskeletal negative musculoskeletal ROS (+)   Abdominal   Peds  Hematology negative hematology ROS (+)   Anesthesia Other Findings   Reproductive/Obstetrics                            Anesthesia Physical Anesthesia Plan  ASA: III  Anesthesia Plan: MAC   Post-op Pain Management:    Induction: Intravenous  PONV Risk Score and Plan: 2 and Propofol infusion and Treatment may vary due to age or medical condition  Airway Management Planned: Natural Airway  Additional Equipment:   Intra-op Plan:   Post-operative Plan:   Informed Consent: I have reviewed the patients History and Physical, chart, labs and discussed the procedure including the risks, benefits and alternatives for the proposed anesthesia with the patient or authorized representative who has indicated his/her understanding and acceptance.     Dental advisory given  Plan Discussed with: CRNA  Anesthesia Plan Comments:         Anesthesia Quick Evaluation

## 2020-06-06 NOTE — Op Note (Signed)
[facility] Patient Name: Barbara Orozco Procedure Date: 06/06/2020 MRN: [id number] Attending MD: Gatha Mayer , MD Date of Birth: 04/23/1986 CSN: [id number] Age: 34 Admit Type: Outpatient Procedure:                Upper GI endoscopy Indications:              Abdominal pain in the left upper quadrant Providers:                Gatha Mayer, MD, Clyde Lundborg, RN, Truddie Coco,                            RN Referring MD:              Medicines:                 Complications:            No immediate complications. Estimated Blood Loss:     Estimated blood loss: none. Procedure:                After obtaining informed consent, the endoscope was                            passed under direct vision. Throughout the                            procedure, the patient's blood pressure, pulse, and                            oxygen saturations were monitored continuously. The                            GIF-H190 ([id number]) Olympus gastroscope was                            introduced through the mouth, and advanced to the                            second part of duodenum. The upper GI endoscopy was                            accomplished without difficulty. The patient                            tolerated the procedure well. Scope In: Scope Out: Findings:      The esophagus was normal.      The stomach was normal.      The examined duodenum was normal.      The cardia and gastric fundus were normal on retroflexion. Impression:               - Normal esophagus.                           - Normal stomach. J -shaped variant                           - Normal examined duodenum.                           -  No specimens collected. No cause of LUQ pain seen Moderate Sedation:      Not Applicable - Patient had care per Anesthesia. Recommendation:           - Patient has a contact number available for                            emergencies. The signs and symptoms of potential                             delayed complications were discussed with the                            patient. Return to normal activities tomorrow.                            Written discharge instructions were provided to the                            patient.                           - Resume previous diet.                           - Continue present medications.                           - See the other procedure note for documentation of                            additional recommendations. Colonoscopy next Procedure Code(s):        --- Professional ---                           [phone], Esophagogastroduodenoscopy, flexible,                            transoral; diagnostic, including collection of                            specimen(s) by brushing or washing, when performed                            (separate procedure) Diagnosis Code(s):        --- Professional ---                           R10.12, Left upper quadrant pain CPT copyright 2019 American Medical Association. All rights reserved. The codes documented in this report are preliminary and upon coder review may  be revised to meet current compliance requirements. Gatha Mayer, MD 06/06/2020 9:56:31 AM This report has been signed electronically. Number of Addenda: 0

## 2020-06-07 ENCOUNTER — Encounter (HOSPITAL_COMMUNITY): Payer: Self-pay | Admitting: Internal Medicine

## 2020-06-07 LAB — SURGICAL PATHOLOGY

## 2020-06-09 ENCOUNTER — Ambulatory Visit (INDEPENDENT_AMBULATORY_CARE_PROVIDER_SITE_OTHER): Payer: BC Managed Care – PPO | Admitting: Neurology

## 2020-06-09 ENCOUNTER — Other Ambulatory Visit: Payer: Self-pay

## 2020-06-09 ENCOUNTER — Encounter: Payer: Self-pay | Admitting: Neurology

## 2020-06-09 VITALS — BP 119/81 | HR 82 | Ht 67.0 in | Wt 284.0 lb

## 2020-06-09 DIAGNOSIS — G40009 Localization-related (focal) (partial) idiopathic epilepsy and epileptic syndromes with seizures of localized onset, not intractable, without status epilepticus: Secondary | ICD-10-CM | POA: Diagnosis not present

## 2020-06-09 MED ORDER — LEVETIRACETAM 500 MG PO TABS: ORAL_TABLET | ORAL | 11 refills | Status: DC
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
## 2020-06-10 ENCOUNTER — Ambulatory Visit: Payer: BC Managed Care – PPO | Admitting: Neurology

## 2020-06-14 ENCOUNTER — Other Ambulatory Visit: Payer: Self-pay

## 2020-06-14 ENCOUNTER — Telehealth: Payer: Self-pay | Admitting: Nurse Practitioner

## 2020-06-14 MED ORDER — PREDNISONE 10 MG PO TABS: ORAL_TABLET | ORAL | 1 refills | Status: DC
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
## 2020-06-14 NOTE — Telephone Encounter (Signed)
OK. Thanks.

## 2020-06-14 NOTE — Telephone Encounter (Signed)
Dr Carlean Purl, I have sent in 11 tablets for her to finish the taper off of prednisone.

## 2020-06-29 ENCOUNTER — Other Ambulatory Visit: Payer: Self-pay

## 2020-06-29 ENCOUNTER — Other Ambulatory Visit: Payer: Self-pay | Admitting: Neurology

## 2020-06-29 ENCOUNTER — Ambulatory Visit (INDEPENDENT_AMBULATORY_CARE_PROVIDER_SITE_OTHER): Payer: BC Managed Care – PPO | Admitting: Neurology

## 2020-06-29 DIAGNOSIS — G40009 Localization-related (focal) (partial) idiopathic epilepsy and epileptic syndromes with seizures of localized onset, not intractable, without status epilepticus: Secondary | ICD-10-CM | POA: Diagnosis not present

## 2020-06-29 MED ORDER — DIAZEPAM 5 MG PO TABS
ORAL_TABLET | ORAL | 0 refills | Status: DC
Start: 2020-06-29 — End: 2021-06-22

## 2020-06-30 NOTE — Telephone Encounter (Signed)
-----   Message from Cameron Sprang, MD sent at 06/29/2020 12:53 PM EDT ----- Regarding: FW: 72 hour EEG issue/ claustrophobic Pls let her know I sent in 2 doses of Valium to her pharmacy for the MRI. She can take 1 dose 30 mins prior to MRI, and second dose if needed. Thanks  ----- Message ----- From: Azalee Course Sent: 06/29/2020   9:41 AM EDT To: Cameron Sprang, MD Subject: 72 hour EEG issue/ claustrophobic              Pt is here for her 1 hour EEG.  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 She cannot take any time off because she was out with COVID for 1 month.  I offered early Friday appointment and early Monday for removal and she said she could do that if no video was required. I said I will pass this on to you, Will it be ok if she does this with no video for 12 hours on Friday?  She said she is claustrophobic will you give her something to help with MRI?  I told her I would ask but she can also communicate via MyChart.

## 2020-07-01 ENCOUNTER — Telehealth: Payer: Self-pay

## 2020-07-01 NOTE — Telephone Encounter (Signed)
-----   Message from Cameron Sprang, MD sent at 06/29/2020 12:53 PM EDT ----- Regarding: FW: 72 hour EEG issue/ claustrophobic Pls let her know I sent in 2 doses of Valium to her pharmacy for the MRI. She can take 1 dose 30 mins prior to MRI, and second dose if needed. Thanks  ----- Message ----- From: Azalee Course Sent: 06/29/2020   9:41 AM EDT To: Cameron Sprang, MD Subject: 72 hour EEG issue/ claustrophobic              Pt is here for her 1 hour EEG.  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 She cannot take any time off because she was out with COVID for 1 month.  I offered early Friday appointment and early Monday for removal and she said she could do that if no video was required. I said I will pass this on to you, Will it be ok if she does this with no video for 12 hours on Friday?  She said she is claustrophobic will you give her something to help with MRI?  I told her I would ask but she can also communicate via MyChart.

## 2020-07-03 ENCOUNTER — Other Ambulatory Visit: Payer: Self-pay | Admitting: Family Medicine

## 2020-07-03 DIAGNOSIS — Z8711 Personal history of peptic ulcer disease: Secondary | ICD-10-CM

## 2020-07-04 NOTE — Telephone Encounter (Signed)
Is this okay to refill? 

## 2020-07-04 NOTE — Telephone Encounter (Signed)
-----   Message from Cameron Sprang, MD sent at 06/29/2020 12:53 PM EDT ----- Regarding: FW: 72 hour EEG issue/ claustrophobic Pls let her know I sent in 2 doses of Valium to her pharmacy for the MRI. She can take 1 dose 30 mins prior to MRI, and second dose if needed. Thanks  ----- Message ----- From: Azalee Course Sent: 06/29/2020   9:41 AM EDT To: Cameron Sprang, MD Subject: 72 hour EEG issue/ claustrophobic              Pt is here for her 1 hour EEG.  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 She cannot take any time off because she was out with COVID for 1 month.  I offered early Friday appointment and early Monday for removal and she said she could do that if no video was required. I said I will pass this on to you, Will it be ok if she does this with no video for 12 hours on Friday?  She said she is claustrophobic will you give her something to help with MRI?  I told her I would ask but she can also communicate via MyChart.

## 2020-07-04 NOTE — Procedures (Signed)
ELECTROENCEPHALOGRAM REPORT  Date of Study: 06/29/2020  Patient's Name: Barbara Orozco MRN: [id number] Date of Birth: 1986/09/29  Referring Provider: Dr. Ellouise Newer  Clinical History: This is a 34 year old woman with recurrent episodes of staring/decreased responsiveness, coppery taste. EEG for classification  Medications: Keppra, Bentyl, Prednisone  Technical Summary: A multichannel digital 1-hour EEG recording measured by the international 10-20 system with electrodes applied with paste and impedances below 5000 ohms performed in our laboratory with EKG monitoring in an awake and asleep patient.  Hyperventilation was not performed. Photic stimulation was performed.  The digital EEG was referentially recorded, reformatted, and digitally filtered in a variety of bipolar and referential montages for optimal display.    Description: The patient is awake and asleep during the recording.  During maximal wakefulness, there is a symmetric, medium voltage 9-9.5 Hz posterior dominant rhythm that attenuates with eye opening.  The record is symmetric.  There is an excess amount of diffuse low voltage beta activity seen throughout the recording. During drowsiness and sleep, there is an increase in theta slowing of the background. Vertex waves and symmetric sleep spindles were seen.  Photic stimulation did not elicit any abnormalities.  There were no epileptiform discharges or electrographic seizures seen.    EKG lead was unremarkable.  Impression: This 1-hour awake and asleep EEG is normal except for excess amount of diffuse low voltage beta activity.  Clinical Correlation: Diffuse low voltage beta activity is commonly seen with sedating medications such as benzodiazepines.  In the absence of sedating medications, anxiety and hyperthyroidism may produce generalized beta activity.  The absence of epileptiform discharges does not exclude a clinical diagnosis of epilepsy.  If further clinical questions  remain, prolonged EEG may be helpful.  Clinical correlation is advised.   Ellouise Newer, M.D.

## 2020-07-04 NOTE — Telephone Encounter (Signed)
She saw GI recently. I recommend she request this prescription from them so that they know how often she is needing this and they can help her manage her reflux for now until under better control.

## 2020-07-04 NOTE — Telephone Encounter (Signed)
Noted, 72-hour EEG will be helpful for seizure classification.

## 2020-07-04 NOTE — Telephone Encounter (Signed)
Called and spoke to patient and informed he that Valium is at the pharmacy and informed her on directions on taking medication. Also, patient wanted me to inform Dr. Delice Lesch that she had 4-5 seizures yesterday, Patient stated that she believes it was because she has colon abscesses. She started taking Amoxicillin last night and has not had a seizure since then. Patient wanted to make Dr. Delice Lesch aware.

## 2020-07-05 NOTE — Telephone Encounter (Signed)
Pt was notified to contact GI but she says shes at work and can't contact gi and is out today of meds and wants a refill

## 2020-07-06 ENCOUNTER — Telehealth: Payer: Self-pay | Admitting: Nurse Practitioner

## 2020-07-06 ENCOUNTER — Other Ambulatory Visit: Payer: Self-pay

## 2020-07-06 ENCOUNTER — Ambulatory Visit
Admission: RE | Admit: 2020-07-06 | Discharge: 2020-07-06 | Disposition: A | Discharge status: Home / Self Care | Payer: BC Managed Care – PPO | Source: Ambulatory Visit | Attending: Neurology | Admitting: Neurology

## 2020-07-06 DIAGNOSIS — Z8711 Personal history of peptic ulcer disease: Secondary | ICD-10-CM

## 2020-07-06 DIAGNOSIS — R569 Unspecified convulsions: Secondary | ICD-10-CM | POA: Diagnosis not present

## 2020-07-06 DIAGNOSIS — G9389 Other specified disorders of brain: Secondary | ICD-10-CM | POA: Diagnosis not present

## 2020-07-06 DIAGNOSIS — G40009 Localization-related (focal) (partial) idiopathic epilepsy and epileptic syndromes with seizures of localized onset, not intractable, without status epilepticus: Secondary | ICD-10-CM

## 2020-07-06 MED ORDER — GADOBENATE DIMEGLUMINE 529 MG/ML IV SOLN
20.0000 mL | Freq: Once | INTRAVENOUS | Status: AC | PRN
  Administered 2020-07-06: 20 mL via INTRAVENOUS

## 2020-07-06 NOTE — Telephone Encounter (Signed)
I left her fiance a message to have her call me. He had her phone. I did confirm the [place] with him. I need to see why she is on pantoprazole bid. I see her PCP refilled it yesterday for once a day. Wanted to ask why she is requesting zofran refill.

## 2020-07-06 NOTE — Telephone Encounter (Signed)
She was seen by Dr Carlean Purl last. This would go to him or his CMA.

## 2020-07-07 MED ORDER — ONDANSETRON 8 MG PO TBDP: 8.0000 mg | ORAL_TABLET | Freq: Three times a day (TID) | ORAL | 3 refills | Status: DC | PRN

## 2020-07-07 MED ORDER — PANTOPRAZOLE SODIUM 40 MG PO TBEC: 40.0000 mg | DELAYED_RELEASE_TABLET | Freq: Two times a day (BID) | ORAL | 5 refills | Status: DC

## 2020-07-07 NOTE — Telephone Encounter (Signed)
I left her a detailed message to call me back.

## 2020-07-07 NOTE — Telephone Encounter (Signed)
Please advise Dr Carlean Purl, thank you. She is on pantoprazole not omeprazole according to the chart Sir.

## 2020-07-07 NOTE — Telephone Encounter (Signed)
Patient is returning your phone call, she states that she takes omeprazole because she has severe stomach acid. She said that she is asking for Zofran refill for the same thing. She said that her PCP will no longer fill the prescriptions because she sees a GI doctor for her stomach. Says they refilled the omeprazole one last time but would only refill it for 1 a day, instead of 2 a day.

## 2020-07-07 NOTE — Telephone Encounter (Signed)
I confirmed with Dr Carlean Purl that I could use the BID dosing for the patients pantoprazole as requested and he said yes. I have refilled both her pantoprazole and ondansetron to the [facility]. I sent her a MyChart message that this has been done.

## 2020-07-07 NOTE — Telephone Encounter (Signed)
OK to refill pantoprazole as per sig x 6 mos and also ondansetron with 3 refills

## 2020-07-08 ENCOUNTER — Telehealth: Payer: Self-pay

## 2020-07-08 NOTE — Telephone Encounter (Signed)
-----   Message from Cameron Sprang, MD sent at 07/04/2020  1:03 PM EDT ----- Pls let her know the EEG was normal, proceed with 72-hour EEG as discussed, thanks

## 2020-07-12 NOTE — Telephone Encounter (Signed)
Called patient and left a message for a call back.  

## 2020-07-12 NOTE — Telephone Encounter (Signed)
Patient called back and requested her results through [place].

## 2020-07-12 NOTE — Telephone Encounter (Signed)
-----   Message from Cameron Sprang, MD sent at 07/07/2020  9:01 AM EDT ----- Pls let her know MRI brain looks good, no evidence of tumor, stroke, or bleed. Proceed with prolonged EEG, thanks

## 2020-07-13 ENCOUNTER — Other Ambulatory Visit: Payer: Self-pay

## 2020-07-13 ENCOUNTER — Telehealth: Payer: Self-pay | Admitting: Nurse Practitioner

## 2020-07-13 DIAGNOSIS — G40009 Localization-related (focal) (partial) idiopathic epilepsy and epileptic syndromes with seizures of localized onset, not intractable, without status epilepticus: Secondary | ICD-10-CM

## 2020-07-13 NOTE — Telephone Encounter (Signed)
Called the patient back. Aware of her work situation. No answer. Did not let it go to voicemail. I have sent her a patient message. It will be the best care if she can come in tomorrow at 3:30 pm so Tye Savoy, NP can examine her.

## 2020-07-13 NOTE — Telephone Encounter (Signed)
Patient is calling, states everytime she wipes there is a ton of blood and pus for the past three days. 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

## 2020-07-14 ENCOUNTER — Ambulatory Visit: Payer: BC Managed Care – PPO | Admitting: Nurse Practitioner

## 2020-07-14 NOTE — Telephone Encounter (Signed)
Spoke with the patient. She has had an exposure to COVID. Tested today. Results are pending.  She had a prescription of antibiotics, Augmentin, which she has started. Her symptoms have improved some. She will stay in touch with [place].

## 2020-07-14 NOTE — Telephone Encounter (Signed)
Patient has severe IBS as Barbara Orozco notes, colonoscopy was negative for inflammatory bowel disease.  She needs referral to colorectal surgery for evaluation of recurrent perianal lesions.  I cannot remember if she has been seen by them yet or at all.

## 2020-07-14 NOTE — Telephone Encounter (Signed)
Called the patient. No answer. Left her a voicemail asking if she can make the appointment today at 3:30 pm for evaluation and treatment.

## 2020-07-14 NOTE — Telephone Encounter (Signed)
[name], I haven't seen her in quite some time. It appears she doesn't have IBD. I need to discuss case with Dr. Carlean Purl. I will include him on this thread because I think he is checking messages. Thanks

## 2020-07-15 NOTE — Telephone Encounter (Signed)
I spoke with the patient. She will call the [facility] Surgery office and set up the appointment with the surgeon for consultation. She did go to the appointment when she was last referred by [place]. COVID and other health concerns had prevented her from following up until now.

## 2020-07-18 NOTE — Telephone Encounter (Signed)
Called patient and informed her of MRI and EEG results. Patient verbalized understanding of results and instructions.

## 2020-07-18 NOTE — Telephone Encounter (Signed)
-----   Message from Cameron Sprang, MD sent at 07/07/2020  9:01 AM EDT ----- Pls let her know MRI brain looks good, no evidence of tumor, stroke, or bleed. Proceed with prolonged EEG, thanks

## 2020-07-18 NOTE — Telephone Encounter (Signed)
-----   Message from Cameron Sprang, MD sent at 07/04/2020  1:03 PM EDT ----- Pls let her know the EEG was normal, proceed with 72-hour EEG as discussed, thanks

## 2020-07-31 ENCOUNTER — Other Ambulatory Visit: Payer: Self-pay | Admitting: Internal Medicine

## 2020-08-05 ENCOUNTER — Other Ambulatory Visit: Payer: Self-pay

## 2020-08-05 ENCOUNTER — Ambulatory Visit: Payer: BC Managed Care – PPO | Admitting: Internal Medicine

## 2020-08-05 ENCOUNTER — Ambulatory Visit (INDEPENDENT_AMBULATORY_CARE_PROVIDER_SITE_OTHER): Payer: BC Managed Care – PPO | Admitting: Neurology

## 2020-08-05 DIAGNOSIS — G40009 Localization-related (focal) (partial) idiopathic epilepsy and epileptic syndromes with seizures of localized onset, not intractable, without status epilepticus: Secondary | ICD-10-CM

## 2020-08-17 ENCOUNTER — Ambulatory Visit: Payer: BC Managed Care – PPO | Admitting: Internal Medicine

## 2020-08-22 NOTE — Procedures (Signed)
ELECTROENCEPHALOGRAM REPORT  Dates of Recording: 08/05/2020 8:37AM to 08/07/2020 5:56PM Patient's Name: Barbara Orozco MRN: [id number] Date of Birth: 1985/12/09  Referring Provider: Dr. Ellouise Newer  Procedure: 57:38-hour ambulatory video EEG  History: This is a 34 year old woman with recurrent episodes of unresponsiveness, coppery taste, shaking with bowel/bladder incontinence. EEG for classification.  Medications:  Keppra   Technical Summary: This is a 57:38-hour multichannel digital video EEG recording measured by the international 10-20 system with electrodes applied with paste and impedances below 5000 ohms performed as portable with EKG monitoring.  The digital EEG was referentially recorded, reformatted, and digitally filtered in a variety of bipolar and referential montages for optimal display.    DESCRIPTION OF RECORDING: During maximal wakefulness, the background activity consisted of a symmetric 10 Hz posterior dominant rhythm which was reactive to eye opening.  There were no epileptiform discharges or focal slowing seen in wakefulness.  During the recording, the patient progresses through wakefulness, drowsiness, and Stage 2 sleep.  Again, there were no epileptiform discharges seen.  Events: On 10/1 at 1321 hours, she has a frontal headache. Patient not on video. Electrographically, there were no EEG or EKG changes seen.  On 10/2 at 1411 hours, she has eye twitching. She is in the car, unable to clearly visualize eye twitching, patient intermittently pressing on left eyelid. Electrographically, there were no EEG or EKG changes seen.  On 10/2 at 1522 hours, eye twitching won't stop. She is in the car, twitching not clearly visible, she is seen squinting both eyes. Electrographically, there were no EEG or EKG changes seen.  On 10/2 at 2347 hours, she is lying on her left side with eyes open then starts having irregular asynchronous body jerking/rocking under the blanket. Eyes roll  back, she is unresponsive to her husband. Episode lasts 56 seconds, she holds her head after. Electrographically, there were no EEG or EKG changes seen. Normal PDR of 10 Hz seen during event.  Patient reports 10/2 1120 she is in the kitchen and blanked out. Patient did not push button. Video reviewed, patient in bedroom at that time. She is out of bedroom on 10/3 at 1120 hours sitting on couch, no clinical changes seen. Electrographically, there were no EEG or EKG changes seen.   There were no electrographic seizures seen.  EKG lead was unremarkable.  IMPRESSION: This 57-hour ambulatory video EEG study is normal. There was a seizure episode reported as described above with no EEG correlate. Episodes of headache, eye twitching, blanking out also did not show EEG change.  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 Normal Baseline EEG. If further clinical questions remain, inpatient video EEG monitoring may be helpful.   Ellouise Newer, M.D.

## 2020-09-16 ENCOUNTER — Ambulatory Visit: Payer: BC Managed Care – PPO | Admitting: Neurology

## 2020-10-10 ENCOUNTER — Ambulatory Visit
Admission: EM | Admit: 2020-10-10 | Discharge: 2020-10-10 | Disposition: A | Discharge status: Home / Self Care | Payer: BC Managed Care – PPO | Attending: Emergency Medicine | Admitting: Emergency Medicine

## 2020-10-10 ENCOUNTER — Ambulatory Visit (HOSPITAL_COMMUNITY)
Admission: EM | Admit: 2020-10-10 | Discharge: 2020-10-10 | Disposition: A | Discharge status: Home / Self Care | Payer: BC Managed Care – PPO

## 2020-10-10 ENCOUNTER — Encounter: Payer: Self-pay | Admitting: *Deleted

## 2020-10-10 DIAGNOSIS — G44319 Acute post-traumatic headache, not intractable: Secondary | ICD-10-CM

## 2020-10-10 DIAGNOSIS — M25512 Pain in left shoulder: Secondary | ICD-10-CM

## 2020-10-10 DIAGNOSIS — M545 Low back pain, unspecified: Secondary | ICD-10-CM

## 2020-10-10 MED ORDER — ACETAMINOPHEN-CODEINE #3 300-30 MG PO TABS: 1.0000 | ORAL_TABLET | Freq: Four times a day (QID) | ORAL | 0 refills | Status: DC | PRN

## 2020-10-10 MED ORDER — ACYCLOVIR 5 % EX CREA: 1.0000 "application " | TOPICAL_CREAM | CUTANEOUS | 0 refills | Status: DC

## 2020-10-10 MED ORDER — DEXAMETHASONE SODIUM PHOSPHATE 10 MG/ML IJ SOLN
10.0000 mg | Freq: Once | INTRAMUSCULAR | Status: AC
  Administered 2020-10-10: 10 mg via INTRAMUSCULAR

## 2020-10-10 MED ORDER — PREDNISONE 10 MG PO TABS: ORAL_TABLET | ORAL | 0 refills | Status: DC
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
## 2020-10-10 NOTE — Discharge Instructions (Signed)
We gave you a shot of Decadron today, continue with prednisone taper over the next 12 days May use Tylenol 3 for severe pain, do not drive or work after taking Follow-up if any symptoms not improving or worsening Please go to emergency room if developing worsening headache, vision changes, vomiting, dizziness lightheadedness

## 2020-10-10 NOTE — ED Notes (Signed)
Patient states she has shingles outbreak on back and would like provider to take a look. Will update provider.

## 2020-10-26 ENCOUNTER — Other Ambulatory Visit: Payer: Self-pay | Admitting: Family Medicine

## 2020-10-26 ENCOUNTER — Other Ambulatory Visit: Payer: Self-pay | Admitting: Neurology

## 2020-10-26 ENCOUNTER — Other Ambulatory Visit: Payer: Self-pay | Admitting: Internal Medicine

## 2020-10-27 ENCOUNTER — Telehealth: Payer: Self-pay | Admitting: Internal Medicine

## 2020-10-27 ENCOUNTER — Other Ambulatory Visit: Payer: Self-pay

## 2020-10-27 ENCOUNTER — Telehealth: Payer: Self-pay | Admitting: Family Medicine

## 2020-10-27 MED ORDER — ONDANSETRON 8 MG PO TBDP: 8.0000 mg | ORAL_TABLET | Freq: Three times a day (TID) | ORAL | 3 refills | Status: DC | PRN

## 2020-10-27 MED ORDER — ALBUTEROL SULFATE HFA 108 (90 BASE) MCG/ACT IN AERS: 2.0000 | INHALATION_SPRAY | RESPIRATORY_TRACT | 0 refills | Status: DC | PRN

## 2020-10-27 NOTE — Telephone Encounter (Signed)
Ok to send in her refill

## 2020-10-27 NOTE — Telephone Encounter (Signed)
Pt is requesting a call back from a nurse to possibly obtain a refill on her Zofran.

## 2020-10-27 NOTE — Telephone Encounter (Signed)
Pt called and states that she needs a refill on her albuterol inhaler state she is having a asthma flare up and needs her inhaler please send to the [id number] - [place] ([address]), [place] - [address]

## 2020-10-27 NOTE — Telephone Encounter (Signed)
Done KH 

## 2020-10-27 NOTE — Telephone Encounter (Signed)
She had her MRI already, she is asking for Valium? She will have to ask PCP for this. thanks

## 2020-10-27 NOTE — Telephone Encounter (Signed)
Barbara Orozco throwing up and requesting zofran refill. Confirmed pharmacy and sent it in.

## 2021-02-10 ENCOUNTER — Encounter: Payer: Self-pay | Admitting: Neurology

## 2021-02-10 ENCOUNTER — Other Ambulatory Visit: Payer: Self-pay

## 2021-02-10 ENCOUNTER — Ambulatory Visit (INDEPENDENT_AMBULATORY_CARE_PROVIDER_SITE_OTHER): Payer: BC Managed Care – PPO | Admitting: Neurology

## 2021-02-10 VITALS — BP 114/70 | HR 78 | Resp 18 | Ht 67.0 in | Wt 283.0 lb

## 2021-02-10 DIAGNOSIS — R569 Unspecified convulsions: Secondary | ICD-10-CM

## 2021-02-10 DIAGNOSIS — F32A Depression, unspecified: Secondary | ICD-10-CM | POA: Diagnosis not present

## 2021-02-10 DIAGNOSIS — F445 Conversion disorder with seizures or convulsions: Secondary | ICD-10-CM

## 2021-02-10 DIAGNOSIS — F419 Anxiety disorder, unspecified: Secondary | ICD-10-CM
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
## 2021-03-10 ENCOUNTER — Encounter: Payer: Self-pay | Admitting: Internal Medicine

## 2021-03-13 ENCOUNTER — Ambulatory Visit: Payer: Self-pay | Admitting: General Surgery

## 2021-03-13 DIAGNOSIS — K603 Anal fistula: Secondary | ICD-10-CM | POA: Diagnosis not present

## 2021-03-13 NOTE — H&P (Signed)
The patient is a 35 year old female who presents with anal pain. Past medical history of seizures and possible IBD. She states over the past 6 years, she has had about 7-8 perianal abscesses. She states one was lanced 1.5 years ago, but the remaining abscesses resolved with antibiotics and spontaneous drainage. She states all abscesses have been in the same location, except for one. She states over the past few weeks she had a recurrence of drainage from an opening near the anal verge. Last colonoscopy performed in June 2021 showed no sign of inflammatory bowel disease. She was on steroids at that time. She has subsequently been taken off of steroids and her symptoms have worsened.    Problem List/Past Medical Leighton Ruff, MD; 03/08/6502 5:11 PM) PERIANAL PAIN (K62.89) ANAL FISTULA (K60.3)  Past Surgical History Leighton Ruff, MD; 03/09/6567 5:11 PM) Appendectomy  Diagnostic Studies History Leighton Ruff, MD; 11/07/7515 5:11 PM) Colonoscopy 5-10 years ago Mammogram never Pap Smear 1-5 years ago  Allergies Lindwood Coke, RN; 03/13/2021 4:47 PM) NSAIDs Allergies Reconciled  Medication History (Diane Herrin, RN; 03/13/2021 4:47 PM) Pantoprazole Sodium (40MG Tablet DR, Oral) Active. Montelukast Sodium (10MG Tablet, Oral) Active. Dicyclomine HCl (10MG Capsule, Oral) Active. Ondansetron (8MG Tablet Disint, Oral) Active. Ventolin HFA (108 (90 Base)MCG/ACT Aerosol Soln, Inhalation) Active. Medications Reconciled  Social History Leighton Ruff, MD; 0/0/1749 5:11 PM) Alcohol use Occasional alcohol use. Caffeine use Carbonated beverages, Coffee, Tea. No drug use Tobacco use Former smoker.  Family History Leighton Ruff, MD; 02/06/9674 5:11 PM) Alcohol Abuse Father, Mother. Arthritis Family Members In General, Mother. Diabetes Mellitus Father. Heart disease in female family member before age 51 Ischemic Bowel Disease Family Members In General, Father,  Mother. Rectal Cancer Family Members In General. Respiratory Condition Mother. Seizure disorder Mother.  Pregnancy / Birth History Leighton Ruff, MD; 07/07/6383 5:11 PM) Age at menarche 71 years. Gravida 0 Irregular periods Para 0  Other Problems Leighton Ruff, MD; 04/10/5992 5:11 PM) Asthma Crohn's Disease Gastric Ulcer Gastroesophageal Reflux Disease Seizure Disorder     Review of Systems Leighton Ruff MD; 03/11/176 5:12 PM) General Present- Fatigue. Not Present- Appetite Loss, Chills, Fever, Night Sweats, Weight Gain and Weight Loss. HEENT Present- Ringing in the Ears and Wears glasses/contact lenses. Not Present- Earache, Hearing Loss, Hoarseness, Nose Bleed, Oral Ulcers, Seasonal Allergies, Sinus Pain, Sore Throat, Visual Disturbances and Yellow Eyes. Respiratory Not Present- Bloody sputum, Chronic Cough, Difficulty Breathing, Snoring and Wheezing. Gastrointestinal Present- Abdominal Pain, Bloating, Chronic diarrhea and Indigestion. Not Present- Bloody Stool, Change in Bowel Habits, Constipation, Difficulty Swallowing, Excessive gas, Gets full quickly at meals, Hemorrhoids, Nausea, Rectal Pain and Vomiting. Female Genitourinary Present- Pelvic Pain. Not Present- Frequency, Nocturia, Painful Urination and Urgency. Musculoskeletal Present- Back Pain, Joint Stiffness and Muscle Pain. Not Present- Joint Pain, Muscle Weakness and Swelling of Extremities. Neurological Present- Headaches and Seizures. Not Present- Decreased Memory, Fainting, Numbness, Tingling, Tremor, Trouble walking and Weakness. Psychiatric Present- Change in Sleep Pattern. Not Present- Anxiety, Bipolar, Depression, Fearful and Frequent crying. Hematology Present- Easy Bruising. Not Present- Blood Thinners, Excessive bleeding, Gland problems, HIV and Persistent Infections.  Vitals (Diane Herrin RN; 03/13/2021 4:48 PM) 03/13/2021 4:48 PM Weight: 281 lb Height: 68in Body Surface Area: 2.36 m Body Mass  Index: 42.73 kg/m  Temp.: 98.51F  Pulse: 111 (Regular)  P.OX: 98% (Room air) BP: 118/76(Sitting, Left Arm, Standard)   Gen: NAD CV:RRR Lungs: CTA Abd: soft Rectal: punctate lesion posterior midline with a shallow palpable cord, purulent drainage noted  Assessment &  Plan Leighton Ruff MD; 01/12/1791 5:10 PM)  ANAL FISTULA (K60.3) Impression: 35 year old female who presents to the office for evaluation of recurrent anal abscesses. 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 I recommended anal exam under anesthesia with seton versus fistulotomy. She does have irregular bowel habits and is undergoing a workup for possible inflammatory bowel disease, although her colonoscopy on steroids was normal. She is now off steroids. We will be conservative with possible fistulotomy and only perform this if temperature or less of the sphincter complex is involved. We have discussed this in detail. We discussed the risk of fistulotomy, as well as the need for additional surgery with seton placement. All questions were answered.

## 2021-05-26 ENCOUNTER — Ambulatory Visit: Payer: BC Managed Care – PPO | Admitting: Neurology

## 2021-06-16 ENCOUNTER — Ambulatory Visit: Payer: BC Managed Care – PPO | Admitting: Neurology

## 2021-06-22 ENCOUNTER — Other Ambulatory Visit: Payer: Self-pay

## 2021-06-22 ENCOUNTER — Ambulatory Visit (HOSPITAL_COMMUNITY)
Admission: EM | Admit: 2021-06-22 | Discharge: 2021-06-22 | Disposition: A | Discharge status: Home / Self Care | Payer: BC Managed Care – PPO | Attending: Family Medicine | Admitting: Family Medicine

## 2021-06-22 ENCOUNTER — Encounter (HOSPITAL_COMMUNITY): Payer: Self-pay

## 2021-06-22 DIAGNOSIS — R21 Rash and other nonspecific skin eruption: Secondary | ICD-10-CM | POA: Diagnosis not present

## 2021-06-22 DIAGNOSIS — H9202 Otalgia, left ear: Secondary | ICD-10-CM | POA: Insufficient documentation

## 2021-06-22 DIAGNOSIS — R062 Wheezing: Secondary | ICD-10-CM | POA: Insufficient documentation

## 2021-06-22 DIAGNOSIS — Z20822 Contact with and (suspected) exposure to covid-19: Secondary | ICD-10-CM | POA: Insufficient documentation

## 2021-06-22 DIAGNOSIS — J069 Acute upper respiratory infection, unspecified: Secondary | ICD-10-CM | POA: Insufficient documentation

## 2021-06-22 MED ORDER — PREDNISONE 20 MG PO TABS: 40.0000 mg | ORAL_TABLET | Freq: Every day | ORAL | 0 refills | Status: DC

## 2021-06-22 MED ORDER — AMOXICILLIN 875 MG PO TABS: 875.0000 mg | ORAL_TABLET | Freq: Two times a day (BID) | ORAL | 0 refills | Status: AC

## 2021-06-22 MED ORDER — BENZONATATE 100 MG PO CAPS: ORAL_CAPSULE | ORAL | 0 refills | Status: DC

## 2021-06-22 NOTE — Discharge Instructions (Addendum)
You have been tested for COVID-19 today. °If your test returns positive, you will receive a phone call from [facility] regarding your results. °Negative test results are not called. °Both positive and negative results area always visible on MyChart. °If you do not have a MyChart account, sign up instructions are provided in your discharge papers. °Please do not hesitate to contact us should you have questions or concerns. ° °

## 2021-06-22 NOTE — ED Triage Notes (Signed)
Pt reports chest congestion, shortness of breath, nasal congestion x 3 days; bilateral ear pain x 1 day.   Pt reports rash all over x 2 weeks.

## 2021-06-22 NOTE — ED Provider Notes (Signed)
Erin   [id number] 06/22/21 Arrival Time: 8127  ASSESSMENT & PLAN:  1. Viral URI with cough   2. Wheezing   3. Acute otalgia, left   4. Rash and nonspecific skin eruption    Discussed typical duration of viral illnesses. COVID-19 testing sent. Developing ear infection. Will see if prednisone helps non-specific rash; unclear etiology. No signs of shingles.  Begin: Meds ordered this encounter  Medications   amoxicillin (AMOXIL) 875 MG tablet    Sig: Take 1 tablet (875 mg total) by mouth 2 (two) times daily for 7 days.    Dispense:  14 tablet    Refill:  0   benzonatate (TESSALON) 100 MG capsule    Sig: Take 1 capsule by mouth every 8 (eight) hours for cough.    Dispense:  21 capsule    Refill:  0   predniSONE (DELTASONE) 20 MG tablet    Sig: Take 2 tablets (40 mg total) by mouth daily.    Dispense:  10 tablet    Refill:  0     Discharge Instructions      You have been tested for COVID-19 today. If your test returns positive, you will receive a phone call from [facility] regarding your results. Negative test results are not called. Both positive and negative results area always visible on MyChart. If you do not have a MyChart account, sign up instructions are provided in your discharge papers. Please do not hesitate to contact us should you have questions or concerns.       Follow-up Information     Henson, Vickie L, NP-C.   Specialty: Family Medicine Why: As needed. Contact information: [place] [address] [phone]                 Reviewed expectations re: course of current medical issues. Questions answered. Outlined signs and symptoms indicating need for more acute intervention. Understanding verbalized. After Visit Summary given.   SUBJECTIVE: History from: patient. Barbara Orozco is a 35 y.o. female who reports cough, chest congestion and heaviness, occas SOB (ques wheezing); past 3 days. Left ear  otalgia; h/o ear infections. No bleeding. Denies: fever. Normal PO intake without n/v/d. Wallie Renshaw 'burning' rash over trunk, arms, legs over past 2 weeks. Unclear cause.  OBJECTIVE:  Vitals:   06/22/21 1143  BP: 129/77  Pulse: 88  Resp: 18  Temp: 98.1 F (36.7 C)  TempSrc: Oral  SpO2: 98%    General appearance: alert; no distress Eyes: PERRLA; EOMI; conjunctiva normal HENT: [place]; AT; with nasal congestion; L TM erythematous and bulging Neck: supple  Lungs: speaks full sentences without difficulty; unlabored; bilateral wheezing Extremities: no edema Skin: warm and dry; very subtle areas of irreg erythema on legs and trunk Neurologic: normal gait Psychological: alert and cooperative; normal mood and affect  Labs:  Labs Reviewed  SARS CORONAVIRUS 2 (TAT 6-24 HRS)    Allergies  Allergen Reactions   Bee Venom Anaphylaxis   Pork Allergy Anaphylaxis   Tramadol Rash and Other (See Comments)    Pt has Crohn's  Disease - Tramadol makes her stomach hurt if she has flare -up     Nsaids     Avoid due to Crohn's disease   Lithium Rash    Other Reaction: Not Assessed    Past Medical History:  Diagnosis Date   Allergy    Asthma    Crohn's disease ([place])    Depression    GERD (gastroesophageal reflux disease)  History of 2019 novel coronavirus disease (COVID-19) 04/29/2020   + Ag test - can see in Care everywhere   Seizures ([place])    last seizure 1 week ago   Ulcerative colitis ([place])    Social History   Socioeconomic History   Marital status: Divorced    Spouse name: Not on file   Number of children: Not on file   Years of education: Not on file   Highest education level: Not on file  Occupational History    Employer: [employer]  Tobacco Use   Smoking status: Former    Types: Cigarettes   Smokeless tobacco: Never  Vaping Use   Vaping Use: Never used  Substance and Sexual Activity   Alcohol use: Yes    Comment: Ocaasionally   Drug use: Yes    Types: Marijuana     Comment: last used 02/24/2020   Sexual activity: Yes  Other Topics Concern   Not on file  Social History Narrative   Engaged   occ EtOH, + Marijuana, no tobacco now - former   Right Handed   Drinks Caffeine    One Story Home    Social Determinants of Health   Financial Resource Strain: Not on file  Food Insecurity: Not on file  Transportation Needs: Not on file  Physical Activity: Not on file  Stress: Not on file  Social Connections: Not on file  Intimate Partner Violence: Not on file   Family History  Problem Relation Age of Onset   Colitis Mother    Ulcerative colitis Mother    Colitis Father    Ulcerative colitis Father    Diabetes Father    Heart disease Father    Colitis Paternal Grandmother    Ulcerative colitis Paternal Grandmother    Stomach cancer Paternal Aunt    Past Surgical History:  Procedure Laterality Date   APPENDECTOMY     BIOPSY  06/06/2020   Procedure: BIOPSY;  Surgeon: Gatha Mayer, MD;  Location: [facility];  Service: Endoscopy;;   COLONOSCOPY WITH PROPOFOL N/A 06/06/2020   Procedure: COLONOSCOPY WITH PROPOFOL;  Surgeon: Gatha Mayer, MD;  Location: [facility];  Service: Endoscopy;  Laterality: N/A;   ESOPHAGOGASTRODUODENOSCOPY (EGD) WITH PROPOFOL N/A 06/06/2020   Procedure: ESOPHAGOGASTRODUODENOSCOPY (EGD) WITH PROPOFOL;  Surgeon: Gatha Mayer, MD;  Location: [facility];  Service: Endoscopy;  Laterality: N/A;   HAND SURGERY Left    x3     Vanessa Kick, MD 06/22/21 1427

## 2021-06-23 ENCOUNTER — Ambulatory Visit: Payer: BC Managed Care – PPO | Admitting: Neurology

## 2021-06-23 LAB — SARS CORONAVIRUS 2 (TAT 6-24 HRS): SARS Coronavirus 2: NEGATIVE

## 2021-06-26 ENCOUNTER — Telehealth: Payer: Self-pay | Admitting: Family Medicine

## 2021-06-26 MED ORDER — ALBUTEROL SULFATE HFA 108 (90 BASE) MCG/ACT IN AERS: 2.0000 | INHALATION_SPRAY | RESPIRATORY_TRACT | 0 refills | Status: DC | PRN

## 2021-06-26 NOTE — Telephone Encounter (Signed)
Pt called and is requesting a refill on her albuterol inhaler please send to the  [facility] ([place]), [place] - [address]

## 2021-07-05 NOTE — Patient Instructions (Signed)
Preventive Care 21-35 Years Old, Female Preventive care refers to lifestyle choices and visits with your health care provider that can promote health and wellness. This includes: A yearly physical exam. This is also called an annual wellness visit. Regular dental and eye exams. Immunizations. Screening for certain conditions. Healthy lifestyle choices, such as: Eating a healthy diet. Getting regular exercise. Not using drugs or products that contain nicotine and tobacco. Limiting alcohol use. What can I expect for my preventive care visit? Physical exam Your health care provider may check your: Height and weight. These may be used to calculate your BMI (body mass index). BMI is a measurement that tells if you are at a healthy weight. Heart rate and blood pressure. Body temperature. Skin for abnormal spots. Counseling Your health care provider may ask you questions about your: Past medical problems. Family's medical history. Alcohol, tobacco, and drug use. Emotional well-being. Home life and relationship well-being. Sexual activity. Diet, exercise, and sleep habits. Work and work environment. Access to firearms. Method of birth control. Menstrual cycle. Pregnancy history. What immunizations do I need? Vaccines are usually given at various ages, according to a schedule. Your health care provider will recommend vaccines for you based on your age, medical history, and lifestyle or other factors, such as travel or where you work. What tests do I need? Blood tests Lipid and cholesterol levels. These may be checked every 5 years starting at age 20. Hepatitis C test. Hepatitis B test. Screening Diabetes screening. This is done by checking your blood sugar (glucose) after you have not eaten for a while (fasting). STD (sexually transmitted disease) testing, if you are at risk. BRCA-related cancer screening. This may be done if you have a family history of breast, ovarian, tubal, or  peritoneal cancers. Pelvic exam and Pap test. This may be done every 3 years starting at age 21. Starting at age 30, this may be done every 5 years if you have a Pap test in combination with an HPV test. Talk with your health care provider about your test results, treatment options, and if necessary, the need for more tests. Follow these instructions at home: Eating and drinking  Eat a healthy diet that includes fresh fruits and vegetables, whole grains, lean protein, and low-fat dairy products. Take vitamin and mineral supplements as recommended by your health care provider. Do not drink alcohol if: Your health care provider tells you not to drink. You are pregnant, may be pregnant, or are planning to become pregnant. If you drink alcohol: Limit how much you have to 0-1 drink a day. Be aware of how much alcohol is in your drink. In the [place] one drink equals one 12 oz bottle of beer (355 mL), one 5 oz glass of wine (148 mL), or one 1 oz glass of hard liquor (44 mL). Lifestyle Take daily care of your teeth and gums. Brush your teeth every morning and night with fluoride toothpaste. Floss one time each day. Stay active. Exercise for at least 30 minutes 5 or more days each week. Do not use any products that contain nicotine or tobacco, such as cigarettes, e-cigarettes, and chewing tobacco. If you need help quitting, ask your health care provider. Do not use drugs. If you are sexually active, practice safe sex. Use a condom or other form of protection to prevent STIs (sexually transmitted infections). If you do not wish to become pregnant, use a form of birth control. If you plan to become pregnant, see your health care provider   for a prepregnancy visit. Find healthy ways to cope with stress, such as: Meditation, yoga, or listening to music. Journaling. Talking to a trusted person. Spending time with friends and family. Safety Always wear your seat belt while driving or riding in a  vehicle. Do not drive: If you have been drinking alcohol. Do not ride with someone who has been drinking. When you are tired or distracted. While texting. Wear a helmet and other protective equipment during sports activities. If you have firearms in your house, make sure you follow all gun safety procedures. Seek help if you have been physically or sexually abused. What's next? Go to your health care provider once a year for an annual wellness visit. Ask your health care provider how often you should have your eyes and teeth checked. Stay up to date on all vaccines. This information is not intended to replace advice given to you by your health care provider. Make sure you discuss any questions you have with your health care provider. Document Revised: 12/30/2020 Document Reviewed: 07/03/2018 Elsevier Patient Education  2022 Elsevier Inc.  

## 2021-07-05 NOTE — Progress Notes (Signed)
Subjective:    Patient ID: Barbara Orozco, female    DOB: 29-Jun-1986, 35 y.o.   MRN: [id number]  HPI Chief Complaint  Patient presents with   Annual Exam    CPE lumps on rt. Breast, fistula on rectum would like you to look at, no obgyn, eye doctor yearly, no dentist.    She is here for a complete physical exam.  She has other concerns today including a breast lump and rectal pain.  Other providers: GI-  and has an appt next week   States she has a lump on her right breast that is growing.  States she initially found it one year ago. Non tender.   States she has ulcerative colitis and a rectal fistula and needs to have surgery. Reports having a lot of pain and bleeding. Requesting Tylenol with codeine.  States she has an appointment with her GI next week.   Social history: Lives with significant other, 2 step children and grandmother, works for [employer]  Denies smoking, occasionally drinks alcohol  CBD gummies or smokes Diet: overeats at time Excerise: recently started doing more activity.  Immunizations: declines Covid and flu vaccines. Tdap in the past 10 years per patient  Health maintenance:   Mammogram: never  Colonoscopy: 2021  Last Gynecological Exam: 2 years ago and normal per patient  Last Menstrual cycle: 2 weeks ago and regular.  Pregnancies: miscarriages  Last Dental Exam: a few years ago  Last Eye Exam: 2021   Wears seatbelt always, uses sunscreen, smoke detectors in home and functioning, does not text while driving and feels safe in home environment.   Reviewed allergies, medications, past medical, surgical, family, and social history.    Review of Systems Review of Systems Constitutional: -fever, -chills, -sweats, -unexpected weight change,-fatigue ENT: -runny nose, -ear pain, -sore throat Cardiology:  -chest pain, -palpitations, -edema Respiratory: -cough, -shortness of breath, -wheezing Gastroenterology: -abdominal pain, -nausea, -vomiting,  -diarrhea, -constipation  Hematology: -bleeding or bruising problems Musculoskeletal: -arthralgias, -myalgias, -joint swelling, -back pain Ophthalmology: -vision changes Urology: -dysuria, -difficulty urinating, -hematuria, -urinary frequency, -urgency Neurology: -headache, -weakness, -tingling, -numbness       Objective:   Physical Exam BP 120/80   Pulse 81   Temp 98.6 F (37 C)   Ht 5' 7"  (1.702 m)   Wt 275 lb 6.4 oz (124.9 kg)   LMP 06/19/2021   Breastfeeding No   BMI 43.13 kg/m   General Appearance:    Alert, cooperative, no distress, appears stated age  Head:    Normocephalic, without obvious abnormality, atraumatic  Eyes:    PERRL, conjunctiva/corneas clear, EOM's intact  Ears:    Normal TM's and external ear canals  Nose: Mask on  Throat: Mask on  Neck:   Supple, no lymphadenopathy;  thyroid:  no   enlargement/tenderness/nodules; no carotid   bruit or JVD  Back:    Spine nontender, no curvature, ROM normal, no CVA     tenderness  Lungs:     Clear to auscultation bilaterally without wheezes, rales or     ronchi; respirations unlabored  Chest Wall:    No tenderness or deformity   Heart:    Regular rate and rhythm, S1 and S2 normal, no murmur, rub   or gallop  Breast Exam:    No tenderness. she does have a rope like breast lump on her right breast medially at approximately 3:00.  No skin changes.  No nipple discharge or inversion.      No axillary  lymphadenopathy  Abdomen:     Soft, non-tender, nondistended, normoactive bowel sounds,    no masses, no hepatosplenomegaly  Genitalia:    Normal external genitalia without lesions.  BUS and vagina normal; cervix without lesions, or cervical motion tenderness. No abnormal vaginal discharge.  Uterus and adnexa not enlarged, nontender, no masses.  Pap performed.  Chaperone present  Rectal:    Not performed  Extremities:   No clubbing, cyanosis or edema  Pulses:   2+ and symmetric all extremities  Skin:   Skin color, texture,  turgor normal, no rashes or lesions  Lymph nodes:   Cervical, supraclavicular, and axillary nodes normal  Neurologic:   CNII-XII intact, normal strength, sensation and gait          Psych:   Normal mood, affect, hygiene and grooming.          Assessment & Plan:  Routine general medical examination at a health care facility - Plan: CBC with Differential/Platelet, Comprehensive metabolic panel, TSH, T4, free, T3 -Preventive health care reviewed.  Counseling on healthy lifestyle including diet and exercise.  I recommend regular dental and eye exams.  Immunizations reviewed.  She has not been vaccinated for COVID and declines.  Discussed safety and health promotion.  Severe obesity (BMI >= 40) (HCC) - Plan: TSH, T4, free, T3, Hemoglobin A1c -Discussed potential long-term health consequences associated with obesity.  Recommend healthy diet and increasing physical activity.  Family history of diabetes mellitus - Plan: Hemoglobin A1c -Hemoglobin A1c performed today due to patient request and family history.  Ulcerative colitis with rectal bleeding, unspecified location ([place]) -Followed by GI and has an appointment next week.  Mild intermittent asthma without complication -Controlled.  No concerns  History of rectal abscess -Reports she was advised that she will need to have surgery to fix this issue.  She has an appointment with GI next week.  Discussed sitz bath's, keeping stool soft.  Immunization declined  Screening for cervical cancer - Plan: Cytology - PAP(Miguel Barrera) -Done per screening guidelines.  Chaperone present.  Tolerated this well.  Breast mass, right - Plan: US BREAST LTD UNI RIGHT INC AXILLA -Growing mass over the past few months per patient.  Ultrasound and mammogram ordered

## 2021-07-06 ENCOUNTER — Other Ambulatory Visit (HOSPITAL_COMMUNITY)
Admission: RE | Admit: 2021-07-06 | Discharge: 2021-07-06 | Disposition: A | Discharge status: Home / Self Care | Payer: BC Managed Care – PPO | Source: Ambulatory Visit | Attending: Family Medicine | Admitting: Family Medicine

## 2021-07-06 ENCOUNTER — Encounter: Payer: Self-pay | Admitting: Family Medicine

## 2021-07-06 ENCOUNTER — Ambulatory Visit (INDEPENDENT_AMBULATORY_CARE_PROVIDER_SITE_OTHER): Payer: BC Managed Care – PPO | Admitting: Family Medicine

## 2021-07-06 ENCOUNTER — Other Ambulatory Visit: Payer: Self-pay | Admitting: Family Medicine

## 2021-07-06 ENCOUNTER — Other Ambulatory Visit: Payer: Self-pay

## 2021-07-06 VITALS — BP 120/80 | HR 81 | Temp 98.6°F | Ht 67.0 in | Wt 275.4 lb

## 2021-07-06 DIAGNOSIS — Z0000 Encounter for general adult medical examination without abnormal findings: Secondary | ICD-10-CM | POA: Diagnosis not present

## 2021-07-06 DIAGNOSIS — Z124 Encounter for screening for malignant neoplasm of cervix: Secondary | ICD-10-CM

## 2021-07-06 DIAGNOSIS — J4520 Mild intermittent asthma, uncomplicated: Secondary | ICD-10-CM

## 2021-07-06 DIAGNOSIS — K51911 Ulcerative colitis, unspecified with rectal bleeding: Secondary | ICD-10-CM

## 2021-07-06 DIAGNOSIS — N6310 Unspecified lump in the right breast, unspecified quadrant: Secondary | ICD-10-CM

## 2021-07-06 DIAGNOSIS — K5090 Crohn's disease, unspecified, without complications: Secondary | ICD-10-CM | POA: Insufficient documentation

## 2021-07-06 DIAGNOSIS — Z2821 Immunization not carried out because of patient refusal: Secondary | ICD-10-CM | POA: Diagnosis not present

## 2021-07-06 DIAGNOSIS — Z8719 Personal history of other diseases of the digestive system: Secondary | ICD-10-CM

## 2021-07-06 DIAGNOSIS — Z833 Family history of diabetes mellitus: Secondary | ICD-10-CM

## 2021-07-07 LAB — CBC WITH DIFFERENTIAL/PLATELET
Basophils Absolute: 0.1 10*3/uL (ref 0.0–0.2)
Basos: 1 %
EOS (ABSOLUTE): 0.4 10*3/uL (ref 0.0–0.4)
Eos: 3 %
Hematocrit: 39.7 % (ref 34.0–46.6)
Hemoglobin: 13.3 g/dL (ref 11.1–15.9)
Immature Grans (Abs): 0 10*3/uL (ref 0.0–0.1)
Immature Granulocytes: 0 %
Lymphocytes Absolute: 3.1 10*3/uL (ref 0.7–3.1)
Lymphs: 29 %
MCH: 28.5 pg (ref 26.6–33.0)
MCHC: 33.5 g/dL (ref 31.5–35.7)
MCV: 85 fL (ref 79–97)
Monocytes Absolute: 0.7 10*3/uL (ref 0.1–0.9)
Monocytes: 6 %
Neutrophils Absolute: 6.5 10*3/uL (ref 1.4–7.0)
Neutrophils: 61 %
Platelets: 391 10*3/uL (ref 150–450)
RBC: 4.67 x10E6/uL (ref 3.77–5.28)
RDW: 13.4 % (ref 11.7–15.4)
WBC: 10.7 10*3/uL (ref 3.4–10.8)

## 2021-07-07 LAB — COMPREHENSIVE METABOLIC PANEL
ALT: 12 IU/L (ref 0–32)
AST: 13 IU/L (ref 0–40)
Albumin/Globulin Ratio: 1.9 (ref 1.2–2.2)
Albumin: 4 g/dL (ref 3.8–4.8)
Alkaline Phosphatase: 92 IU/L (ref 44–121)
BUN/Creatinine Ratio: 16 (ref 9–23)
BUN: 10 mg/dL (ref 6–20)
Bilirubin Total: 0.3 mg/dL (ref 0.0–1.2)
CO2: 19 mmol/L — ABNORMAL LOW (ref 20–29)
Calcium: 9 mg/dL (ref 8.7–10.2)
Chloride: 103 mmol/L (ref 96–106)
Creatinine, Ser: 0.64 mg/dL (ref 0.57–1.00)
Globulin, Total: 2.1 g/dL (ref 1.5–4.5)
Glucose: 90 mg/dL (ref 65–99)
Potassium: 4.5 mmol/L (ref 3.5–5.2)
Sodium: 138 mmol/L (ref 134–144)
Total Protein: 6.1 g/dL (ref 6.0–8.5)
eGFR: 119 mL/min/{1.73_m2} (ref >59)

## 2021-07-07 LAB — T4, FREE: Free T4: 1.21 ng/dL (ref 0.82–1.77)

## 2021-07-07 LAB — HEMOGLOBIN A1C
Est. average glucose Bld gHb Est-mCnc: 111 mg/dL
Hgb A1c MFr Bld: 5.5 % (ref 4.8–5.6)

## 2021-07-07 LAB — CYTOLOGY - PAP
Comment: NEGATIVE
Diagnosis: NEGATIVE
High risk HPV: NEGATIVE

## 2021-07-07 LAB — T3: T3, Total: 117 ng/dL (ref 71–180)

## 2021-07-07 LAB — TSH: TSH: 2.07 u[IU]/mL (ref 0.450–4.500)

## 2021-07-11 ENCOUNTER — Other Ambulatory Visit: Payer: BC Managed Care – PPO

## 2021-07-12 ENCOUNTER — Ambulatory Visit: Payer: BC Managed Care – PPO | Admitting: Nurse Practitioner

## 2021-07-12 ENCOUNTER — Encounter: Payer: Self-pay | Admitting: Nurse Practitioner

## 2021-07-12 VITALS — BP 106/68 | HR 86 | Ht 67.0 in | Wt 274.0 lb

## 2021-07-12 DIAGNOSIS — K603 Anal fistula: Secondary | ICD-10-CM

## 2021-07-12 MED ORDER — LIDOCAINE HCL URETHRAL/MUCOSAL 2 % EX GEL: CUTANEOUS | 1 refills | Status: DC

## 2021-07-12 MED ORDER — AMOXICILLIN-POT CLAVULANATE 875-125 MG PO TABS: 1.0000 | ORAL_TABLET | Freq: Two times a day (BID) | ORAL | 0 refills | Status: AC

## 2021-07-12 NOTE — Progress Notes (Signed)
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 She had a normal colonoscopy with random biopsies Aug 2021. On exam there is a tiny, raised right perianal lesion ( non-draining). On DRE there is tenderness and fullness of right lateral anal canal wall.  --Will send in Rx for Augmentin . However, I explained to her that this is a surgical issue that we cannot fix. She has been told by [facility] that she needs surgery but she under no circumstances can she get time off until the beginning of next year.      HISTORY OF PRESENT ILLNESS    Chief Complaint :rectal pain and discharge  Barbara Orozco is a 35 y.o. female known to Dr. Jonne Ply  with a past medical history significant for IBS, obesity. See PMH below for any additional medical problems.    Hailly established care here in March 2021 for diarrhea / perianal abscesses. She reported a longstanding history of UC but wasn't on treatment and we had no records for review. On exam she had no evidence for fistula  / rectal drainage though she had been recently treated with antibiotics by another provider. Her inflammatory markers were elevated, she was treated with a tapering dose of prednisone. She was scheduled for colonoscopy but had to cancel due to a seizure. She had the colonoscopy  August 2021 which was normal except for a perianal nodule for which we referred her to [facility].  She has been told by colorectal surgery that she needs what sounds like exam under anesthesia for evaluation of a perianal fistula.  She plans to have this done the beginning of next year when she has "sick time" at work.  She says [facility] told her she may need a week for recovery but she has no available time to take away from work.    INTERVAL HISTORY: Patient comes in today with complaints of pus like drainage from her rectum again. The drainage never really resolved but has been much worse lately. Drainage occurs throughout the day,  sometimes saturating her garments. She has chronic,  loose stool but doesn't want to take anything for diarrhea as solid stools cause rectal pain. She hasn't had any fevers. No abdominal pain. She last saw [facility] in June or July, told she still needs surgery.     PREVIOUS ENDOSCOPIC EVALUATIONS / PERTINENT STUDIES:   Induration/SQ tiny nodule posterior found on perianal exam. No active abscesses - The examined portion of the ileum was normal. Biopsied. - The entire examined colon is normal. Biopsied.  Random colon biopsies negative    Past Medical History:  Diagnosis Date   Allergy    Asthma    Crohn's disease ([place])    Depression    GERD (gastroesophageal reflux disease)    History of 2019 novel coronavirus disease (COVID-19) 04/29/2020   + Ag test - can see in Care everywhere   Seizures ([place])    last seizure 1 week ago   Ulcerative colitis ([place])     Current Medications, Allergies, Past Surgical History, Family History and Social History were reviewed in [employer] record.   Current Outpatient Medications  Medication Sig Dispense Refill   acyclovir cream (ZOVIRAX) 5 % Apply 1 application topically every 4 (four) hours. 15 g 0   albuterol (VENTOLIN HFA) 108 (90 Base) MCG/ACT inhaler Inhale 2 puffs into the lungs every 4 (four) hours as needed for shortness of  breath. 6.7 g 0   dicyclomine (BENTYL) 10 MG capsule Take 10 mg by mouth 4 (four) times daily -  before meals and at bedtime.     Multiple Vitamin (MULTIVITAMIN WITH MINERALS) TABS tablet Take 1 tablet by mouth daily.     ondansetron (ZOFRAN-ODT) 4 MG disintegrating tablet Take 4 mg by mouth every 8 (eight) hours as needed for nausea or vomiting.     pantoprazole (PROTONIX) 40 MG tablet Take 1 tablet (40 mg total) by mouth 2 (two) times daily before a meal. 60 tablet 5   No current facility-administered medications for this visit.    Review of Systems: No chest pain. No shortness of breath. No  urinary complaints.   PHYSICAL EXAM :    Wt Readings from Last 3 Encounters:  07/12/21 274 lb (124.3 kg)  07/06/21 275 lb 6.4 oz (124.9 kg)  02/10/21 283 lb (128.4 kg)    BP 106/68 (BP Location: Right Arm, Patient Position: Sitting, Cuff Size: Large)   Pulse 86   Ht 5' 7"  (1.702 m)   Wt 274 lb (124.3 kg)   LMP 06/19/2021   SpO2 99%   BMI 42.91 kg/m  Constitutional:  Pleasant female in no acute distress. Psychiatric: Normal mood and affect. Behavior is normal. EENT: Pupils normal.  Conjunctivae are normal. No scleral icterus. Neck supple.  Cardiovascular: Normal rate, regular rhythm. No edema Pulmonary/chest: Effort normal and breath sounds normal. No wheezing, rales or rhonchi. Abdominal: Soft, nondistended, nontender. Bowel sounds active throughout. There are no masses palpable.  Rectal: tiny red raised lesion just right of posterior midline. On DRE there is tenderness and fullness of right lateral wall Neurological: Alert and oriented to person place and time. Skin: Skin is warm and dry. No rashes noted.  Tye Savoy, NP  07/12/2021, 10:24 AM

## 2021-07-12 NOTE — Patient Instructions (Addendum)
It was my pleasure to provide care to you today. Based on our discussion, I am providing you with my recommendations below:  RECOMMENDATION(S):   PRESCRIPTION MEDICATION(S):   We have sent the following medication(s) to your pharmacy:  Augmentin Xylocaine 2% gel  NOTE: If your medication(s) requires a PRIOR AUTHORIZATION, we will receive notification from your pharmacy. Once received, the process to submit for approval may take up to 7-10 business days. You will be contacted about any denials we have received from your insurance company as well as alternatives recommended by your provider.  FOLLOW UP:  I would like for you to follow up with me as needed. Please call if you develop symptoms of a yeast infection from use of the antibiotic   BMI:  If you are age 35 or younger, your body mass index should be between 19-25. Your Body mass index is 42.91 kg/m. If this is out of the aformentioned range listed, please consider follow up with your Primary Care Provider.   MY CHART:  The [place] [facility] providers would like to encourage you to use [facility] to communicate with providers for non-urgent requests or questions.  Due to long hold times on the telephone, sending your provider a message by [facility] may be a faster and more efficient way to get a response.  Please allow 48 business hours for a response.  Please remember that this is for non-urgent requests.   Thank you for trusting me with your gastrointestinal care!    Tye Savoy, NP

## 2021-07-22 ENCOUNTER — Other Ambulatory Visit: Payer: Self-pay

## 2021-07-22 ENCOUNTER — Ambulatory Visit
Admission: RE | Admit: 2021-07-22 | Discharge: 2021-07-22 | Disposition: A | Discharge status: Home / Self Care | Payer: BC Managed Care – PPO | Source: Ambulatory Visit | Attending: Family Medicine | Admitting: Family Medicine

## 2021-07-22 DIAGNOSIS — R922 Inconclusive mammogram: Secondary | ICD-10-CM | POA: Diagnosis not present

## 2021-07-22 DIAGNOSIS — N6310 Unspecified lump in the right breast, unspecified quadrant: Secondary | ICD-10-CM

## 2021-12-01 DIAGNOSIS — Z3689 Encounter for other specified antenatal screening: Secondary | ICD-10-CM | POA: Diagnosis not present

## 2021-12-01 DIAGNOSIS — Z3200 Encounter for pregnancy test, result unknown: Secondary | ICD-10-CM | POA: Diagnosis not present

## 2021-12-01 DIAGNOSIS — Z8759 Personal history of other complications of pregnancy, childbirth and the puerperium: Secondary | ICD-10-CM | POA: Diagnosis not present

## 2021-12-05 ENCOUNTER — Inpatient Hospital Stay (HOSPITAL_COMMUNITY)
Admission: AD | Admit: 2021-12-05 | Discharge: 2021-12-05 | Disposition: A | Discharge status: Home / Self Care | Payer: 59 | Attending: Obstetrics and Gynecology | Admitting: Obstetrics and Gynecology

## 2021-12-05 ENCOUNTER — Other Ambulatory Visit: Payer: Self-pay | Admitting: Obstetrics and Gynecology

## 2021-12-05 ENCOUNTER — Encounter (HOSPITAL_COMMUNITY): Payer: Self-pay | Admitting: Obstetrics and Gynecology

## 2021-12-05 DIAGNOSIS — O00101 Right tubal pregnancy without intrauterine pregnancy: Secondary | ICD-10-CM | POA: Diagnosis not present

## 2021-12-05 DIAGNOSIS — O009 Unspecified ectopic pregnancy without intrauterine pregnancy: Secondary | ICD-10-CM | POA: Diagnosis not present

## 2021-12-05 DIAGNOSIS — Z8759 Personal history of other complications of pregnancy, childbirth and the puerperium: Secondary | ICD-10-CM | POA: Diagnosis not present

## 2021-12-05 HISTORY — DX: Irritable bowel syndrome, unspecified: K58.9

## 2021-12-05 HISTORY — DX: Unspecified fracture of left lower leg, initial encounter for closed fracture: S82.92XA

## 2021-12-05 HISTORY — DX: Unspecified fracture of shaft of humerus, unspecified arm, initial encounter for closed fracture: S42.309A

## 2021-12-05 HISTORY — DX: Polycystic ovarian syndrome: E28.2

## 2021-12-05 LAB — COMPREHENSIVE METABOLIC PANEL
ALT: 13 U/L (ref 0–44)
AST: 17 U/L (ref 15–41)
Albumin: 3.3 g/dL — ABNORMAL LOW (ref 3.5–5.0)
Alkaline Phosphatase: 76 U/L (ref 38–126)
Anion gap: 8 (ref 5–15)
BUN: 7 mg/dL (ref 6–20)
CO2: 22 mmol/L (ref 22–32)
Calcium: 8.9 mg/dL (ref 8.9–10.3)
Chloride: 107 mmol/L (ref 98–111)
Creatinine, Ser: 0.71 mg/dL (ref 0.44–1.00)
GFR, Estimated: >60 mL/min (ref >60)
Glucose, Bld: 109 mg/dL — ABNORMAL HIGH (ref 70–99)
Potassium: 3.3 mmol/L — ABNORMAL LOW (ref 3.5–5.1)
Sodium: 137 mmol/L (ref 135–145)
Total Bilirubin: 0.4 mg/dL (ref 0.3–1.2)
Total Protein: 6.4 g/dL — ABNORMAL LOW (ref 6.5–8.1)

## 2021-12-05 LAB — CBC WITH DIFFERENTIAL/PLATELET
Abs Immature Granulocytes: 0.03 10*3/uL (ref 0.00–0.07)
Basophils Absolute: 0 10*3/uL (ref 0.0–0.1)
Basophils Relative: 0 %
Eosinophils Absolute: 0.3 10*3/uL (ref 0.0–0.5)
Eosinophils Relative: 3 %
HCT: 38.7 % (ref 36.0–46.0)
Hemoglobin: 12.7 g/dL (ref 12.0–15.0)
Immature Granulocytes: 0 %
Lymphocytes Relative: 32 %
Lymphs Abs: 3.3 10*3/uL (ref 0.7–4.0)
MCH: 28.3 pg (ref 26.0–34.0)
MCHC: 32.8 g/dL (ref 30.0–36.0)
MCV: 86.2 fL (ref 80.0–100.0)
Monocytes Absolute: 0.6 10*3/uL (ref 0.1–1.0)
Monocytes Relative: 5 %
Neutro Abs: 6.1 10*3/uL (ref 1.7–7.7)
Neutrophils Relative %: 60 %
Platelets: 371 10*3/uL (ref 150–400)
RBC: 4.49 MIL/uL (ref 3.87–5.11)
RDW: 14 % (ref 11.5–15.5)
WBC: 10.4 10*3/uL (ref 4.0–10.5)
nRBC: 0 % (ref 0.0–0.2)

## 2021-12-05 LAB — TYPE AND SCREEN
ABO/RH(D): A POS
Antibody Screen: NEGATIVE

## 2021-12-05 LAB — HCG, QUANTITATIVE, PREGNANCY: hCG, Beta Chain, Quant, S: 10950 m[IU]/mL — ABNORMAL HIGH (ref <5)

## 2021-12-05 MED ORDER — METHOTREXATE FOR ECTOPIC PREGNANCY
50.0000 mg/m2 | Freq: Once | INTRAMUSCULAR | Status: AC
  Administered 2021-12-05: 122.5 mg via INTRAMUSCULAR
  Filled 2021-12-05: qty 10

## 2021-12-05 NOTE — MAU Note (Signed)
Sent from office.  Pain has gotten worse after [place] this morning. Was dx at that time with a right sided ectopic preg.  Pain is more on her left side. Sent in for MTX

## 2021-12-05 NOTE — H&P (Signed)
36 y/o G3P0020 sent to MAU from office d/t new finding of right ectopic pregnancy.  BHCG in office 1/27 was about 3000, today u/s was done showing right ectopic with u/s findings of 16 mm EMS, no IUP, R adnexal mass 1.6 x 1.6 x 1.4 cm c/w R ectopic. 2 CLC. no FF.  Patient was counseled on mtx vs surgical management, pt verbalized wanted to avoid surgery d/t h/o ?ectopic and early sab.  She was sent for labs, bhcg and mtx pending bhcg.  She c/o some LLQ tenderness, not severe, noticed some before u/s then after - anxious that u/s did something to her left side.   I have now reviewed with patient and her partner new finding of bhcg today 10,900.   Pertinent Gynecological History: See above H/o gonorrhea as teenager   Menstrual History:  Patient's last menstrual period was 10/26/2021.    Past Medical History:  Diagnosis Date   Allergy    Arm fracture    right and left   Asthma    Depression    GERD (gastroesophageal reflux disease)    History of 2019 novel coronavirus disease (COVID-19) 04/29/2020   + Ag test - can see in Care everywhere   Irritable bowel    Leg fracture, left    PCOS (polycystic ovarian syndrome)    Seizures ([place])    month ago (Dec 2022)   Ulcerative colitis [facility])     Past Surgical History:  Procedure Laterality Date   APPENDECTOMY     BIOPSY  06/06/2020   Procedure: BIOPSY;  Surgeon: Gatha Mayer, MD;  [facility]: [facility];  Service: Endoscopy;;   COLONOSCOPY WITH PROPOFOL N/A 06/06/2020   Procedure: COLONOSCOPY WITH PROPOFOL;  Surgeon: Gatha Mayer, MD;  [facility];  Service: Endoscopy;  Laterality: N/A;   ESOPHAGOGASTRODUODENOSCOPY (EGD) WITH PROPOFOL N/A 06/06/2020   Procedure: ESOPHAGOGASTRODUODENOSCOPY (EGD) WITH PROPOFOL;  Surgeon: Gatha Mayer, MD;  [facility];  Service: Endoscopy;  Laterality: N/A;   HAND SURGERY Left    x3    Family History  Problem Relation Age of Onset   Colitis Mother    Ulcerative colitis  Mother    Heart disease Mother    Other Mother    Colitis Father    Ulcerative colitis Father    Diabetes Father    Stomach cancer Paternal Aunt    Colitis Paternal Grandmother    Ulcerative colitis Paternal Grandmother    Colon cancer Neg Hx    Pancreatic cancer Neg Hx    Esophageal cancer Neg Hx     Social History:  reports that she has quit smoking. Her smoking use included cigarettes. She has never used smokeless tobacco. She reports that she does not currently use alcohol. She reports that she does not currently use drugs after having used the following drugs: Marijuana.  Allergies:  Allergies  Allergen Reactions   Bee Venom Anaphylaxis   Pork Allergy Anaphylaxis   Tramadol Rash and Other (See Comments)    Pt has Crohn's  Disease - Tramadol makes her stomach hurt if she has flare -up     Nsaids     Avoid due to Crohn's disease   Lithium Rash    Other Reaction: Not Assessed    Medications Prior to Admission  Medication Sig Dispense Refill Last Dose   Multiple Vitamin (MULTIVITAMIN WITH MINERALS) TABS tablet Take 1 tablet by mouth daily.   12/05/2021   acyclovir cream (ZOVIRAX) 5 % Apply 1 application  topically every 4 (four) hours. 15 g 0    albuterol (VENTOLIN HFA) 108 (90 Base) MCG/ACT inhaler Inhale 2 puffs into the lungs every 4 (four) hours as needed for shortness of breath. 6.7 g 0 More than a month   dicyclomine (BENTYL) 10 MG capsule Take 10 mg by mouth 4 (four) times daily -  before meals and at bedtime.   More than a month   lidocaine (XYLOCAINE) 2 % jelly Apply perianally and just inside the anus TID prn  for anal pain 30 mL 1    ondansetron (ZOFRAN-ODT) 4 MG disintegrating tablet Take 4 mg by mouth every 8 (eight) hours as needed for nausea or vomiting.   More than a month   pantoprazole (PROTONIX) 40 MG tablet Take 1 tablet (40 mg total) by mouth 2 (two) times daily before a meal. 60 tablet 5 More than a month    ROS SOB/chest pain/ HA/ vision changes/ LE  pain or swelling/ etc.  Blood pressure 129/81, pulse 86, temperature 97.7 F (36.5 C), temperature source Oral, resp. rate 18, height 5' 7"  (1.702 m), weight 126.8 kg, last menstrual period 10/26/2021, SpO2 97 %. Physical Exam A&O x 3 HEENT : grossly wnl Lungs : ctab CV : rrr Abdo : morbidly obese, soft, nt,nd; no rebound/guarding Extr : no edema, nt bilat Pelvic : deferred   Results for orders placed or performed during the hospital encounter of 12/05/21 (from the past 24 hour(s))  hCG, quantitative, pregnancy     Status: Abnormal   Collection Time: 12/05/21  2:14 PM  Result Value Ref Range   hCG, Beta Chain, Quant, S 10,950 (H) <5 mIU/mL  CBC WITH DIFFERENTIAL     Status: None   Collection Time: 12/05/21  2:14 PM  Result Value Ref Range   WBC 10.4 4.0 - 10.5 K/uL   RBC 4.49 3.87 - 5.11 MIL/uL   Hemoglobin 12.7 12.0 - 15.0 g/dL   HCT 38.7 36.0 - 46.0 %   MCV 86.2 80.0 - 100.0 fL   MCH 28.3 26.0 - 34.0 pg   MCHC 32.8 30.0 - 36.0 g/dL   RDW 14.0 11.5 - 15.5 %   Platelets 371 150 - 400 K/uL   nRBC 0.0 0.0 - 0.2 %   Neutrophils Relative % 60 %   Neutro Abs 6.1 1.7 - 7.7 K/uL   Lymphocytes Relative 32 %   Lymphs Abs 3.3 0.7 - 4.0 K/uL   Monocytes Relative 5 %   Monocytes Absolute 0.6 0.1 - 1.0 K/uL   Eosinophils Relative 3 %   Eosinophils Absolute 0.3 0.0 - 0.5 K/uL   Basophils Relative 0 %   Basophils Absolute 0.0 0.0 - 0.1 K/uL   Immature Granulocytes 0 %   Abs Immature Granulocytes 0.03 0.00 - 0.07 K/uL  Comprehensive metabolic panel     Status: Abnormal   Collection Time: 12/05/21  2:14 PM  Result Value Ref Range   Sodium 137 135 - 145 mmol/L   Potassium 3.3 (L) 3.5 - 5.1 mmol/L   Chloride 107 98 - 111 mmol/L   CO2 22 22 - 32 mmol/L   Glucose, Bld 109 (H) 70 - 99 mg/dL   BUN 7 6 - 20 mg/dL   Creatinine, Ser 0.71 0.44 - 1.00 mg/dL   Calcium 8.9 8.9 - 10.3 mg/dL   Total Protein 6.4 (L) 6.5 - 8.1 g/dL   Albumin 3.3 (L) 3.5 - 5.0 g/dL   AST 17 15 - 41 U/L  ALT  13 0 - 44 U/L   Alkaline Phosphatase 76 38 - 126 U/L   Total Bilirubin 0.4 0.3 - 1.2 mg/dL   GFR, Estimated >60 >60 mL/min   Anion gap 8 5 - 15  Type and screen [place]     Status: None (Preliminary result)   Collection Time: 12/05/21  4:37 PM  Result Value Ref Range   ABO/RH(D) PENDING    Antibody Screen PENDING    Sample Expiration      12/08/2021,2359 Performed at [facility], [place] [address], [place], [place] [address]     No results found.  Assessment/Plan: 36 y/o G3P0020 with right ectopic pregnancy. I have discussed r/b/a mtx vs surgery with patient and her partner.  While patient has no contra-indication to MTX, we discussed risk of medical failure failure d/t high bhcg level.  We discussed risk of needing repeat doses and also risk of rupture which is medical emergency.  We discussed that if we proceed with surgical management that we would proceed to OR now for l/s right salpingectomy.  Risk of procedure reivewed including risk of bleeding/infection, risk of injury to bowel, bladder, nerves, blood vessels, risk of anesthesia, risk of blood clot to leg/lung, risk of further surgery.  She understands that if she ruptures than she would have the same surgery but then emergency and life-threatening.  She very much wants to avoid surgery stating that she has a poor history and doesn't want to only have one tube.  We did also discuss that this tube is not likely nml which is why ectopic occurred.  F/u plan discussed if declines surgical management which would be mtx today, then bhcg d4 and bhcg/sono d7.  She does understand that if medical failure, would reassess for surgical vs second dose.  Pt is also feeling overwhelmed about having to make decision.  She understands that she can proceed with MTX but that she can always change her mind for surgical management after prepossessing r/b/a of both treatments.  After reviewing all of this, the patient was given time to  discuss with partner.  She wants MTX.  She and partner were given rupture precautions.  She lives in [place].  All questions answered.  We also discussed MTX, SE and dosing. 25mn in chart review, counseling, exam, documentation   SCharyl Bigger1/31/2023, 5:30 PM

## 2021-12-05 NOTE — MAU Provider Note (Addendum)
None     S Ms. Barbara Orozco is a 36 y.o. [phone] patient who presents to MAU today from [facility] (Dr. Amado Nash) for MTX after diagnosed right tube ectopic pregnancy.   Patient reports some left sided slight abdominal pain since her ultrasound was done. She reports the ultrasonographer had to press hard on her left side to visualize her tubes and ovary and pain started after that. She did inform Dr. Amado Nash about her left sided pain and per patient was told it was likely from the ultrasound  She reports some very slight right sided cramping (which is the side her ectopic pregnancy is on).  O BP 129/81 (BP Location: Right Arm)    Pulse 86    Temp 97.7 F (36.5 C) (Oral)    Resp 18    Ht 5\' 7"  (1.702 m)    Wt 126.8 kg    LMP 10/26/2021    SpO2 97%    BMI 43.79 kg/m  Physical Exam Vitals and nursing note reviewed.  HENT:     Head: Normocephalic.  Cardiovascular:     Rate and Rhythm: Normal rate.  Pulmonary:     Effort: Pulmonary effort is normal.  Abdominal:     Comments: Soft, very mild TTP of right and left side, no rebound, no guarding.   Neurological:     Mental Status: She is alert.    A Medical screening exam complete. Patient is hemodynamically stable and her abdominal pain is at side opposite to her confirmed ectopic diagnosis. Very mild pain that has not changed is also present on side of her ectopic.    P - medical screen complete. Further management per Dr. 10/28/2021, MD, MPH OB Fellow, Faculty [facility]

## 2021-12-06 ENCOUNTER — Other Ambulatory Visit: Payer: Self-pay

## 2021-12-06 MED ORDER — ONDANSETRON 4 MG PO TBDP: 4.0000 mg | ORAL_TABLET | Freq: Three times a day (TID) | ORAL | 0 refills | Status: DC | PRN

## 2021-12-06 MED ORDER — DICYCLOMINE HCL 10 MG PO CAPS: 10.0000 mg | ORAL_CAPSULE | Freq: Three times a day (TID) | ORAL | 0 refills | Status: DC

## 2021-12-08 DIAGNOSIS — O009 Unspecified ectopic pregnancy without intrauterine pregnancy: Secondary | ICD-10-CM | POA: Diagnosis not present

## 2021-12-11 ENCOUNTER — Encounter (HOSPITAL_COMMUNITY): Payer: Self-pay | Admitting: Obstetrics

## 2021-12-11 ENCOUNTER — Ambulatory Visit (HOSPITAL_COMMUNITY)
Admission: RE | Admit: 2021-12-11 | Discharge: 2021-12-11 | Disposition: A | Discharge status: Home / Self Care | Payer: 59 | Attending: Obstetrics | Admitting: Obstetrics

## 2021-12-11 ENCOUNTER — Ambulatory Visit (HOSPITAL_COMMUNITY): Payer: 59 | Admitting: Anesthesiology

## 2021-12-11 ENCOUNTER — Other Ambulatory Visit: Payer: Self-pay

## 2021-12-11 ENCOUNTER — Encounter (HOSPITAL_COMMUNITY)
Admission: RE | Disposition: A | Discharge status: Home / Self Care | Payer: Self-pay | Source: Home / Self Care | Attending: Obstetrics

## 2021-12-11 DIAGNOSIS — O00101 Right tubal pregnancy without intrauterine pregnancy: Secondary | ICD-10-CM | POA: Diagnosis not present

## 2021-12-11 DIAGNOSIS — K660 Peritoneal adhesions (postprocedural) (postinfection): Secondary | ICD-10-CM | POA: Diagnosis not present

## 2021-12-11 DIAGNOSIS — N8000 Endometriosis of the uterus, unspecified: Secondary | ICD-10-CM | POA: Diagnosis not present

## 2021-12-11 DIAGNOSIS — J45909 Unspecified asthma, uncomplicated: Secondary | ICD-10-CM | POA: Insufficient documentation

## 2021-12-11 DIAGNOSIS — K219 Gastro-esophageal reflux disease without esophagitis: Secondary | ICD-10-CM | POA: Diagnosis not present

## 2021-12-11 DIAGNOSIS — K279 Peptic ulcer, site unspecified, unspecified as acute or chronic, without hemorrhage or perforation: Secondary | ICD-10-CM | POA: Diagnosis not present

## 2021-12-11 DIAGNOSIS — N736 Female pelvic peritoneal adhesions (postinfective): Secondary | ICD-10-CM | POA: Diagnosis not present

## 2021-12-11 DIAGNOSIS — N809 Endometriosis, unspecified: Secondary | ICD-10-CM | POA: Diagnosis not present

## 2021-12-11 DIAGNOSIS — Z87891 Personal history of nicotine dependence: Secondary | ICD-10-CM | POA: Diagnosis not present

## 2021-12-11 DIAGNOSIS — O009 Unspecified ectopic pregnancy without intrauterine pregnancy: Secondary | ICD-10-CM | POA: Diagnosis not present

## 2021-12-11 HISTORY — PX: DIAGNOSTIC LAPAROSCOPY WITH REMOVAL OF ECTOPIC PREGNANCY: SHX6449

## 2021-12-11 LAB — TYPE AND SCREEN
ABO/RH(D): A POS
Antibody Screen: NEGATIVE

## 2021-12-11 SURGERY — LAPAROSCOPY, WITH ECTOPIC PREGNANCY SURGICAL TREATMENT
Anesthesia: General | Laterality: Right

## 2021-12-11 MED ORDER — MIDAZOLAM HCL 2 MG/2ML IJ SOLN
INTRAMUSCULAR | Status: DC | PRN
Start: 2021-12-11 — End: 2021-12-11
  Administered 2021-12-11: 2 mg via INTRAVENOUS

## 2021-12-11 MED ORDER — ONDANSETRON HCL 4 MG/2ML IJ SOLN
INTRAMUSCULAR | Status: DC | PRN
  Administered 2021-12-11: 4 mg via INTRAVENOUS

## 2021-12-11 MED ORDER — SUCCINYLCHOLINE CHLORIDE 200 MG/10ML IV SOSY
PREFILLED_SYRINGE | INTRAVENOUS | Status: DC | PRN
Start: 2021-12-11 — End: 2021-12-11
  Administered 2021-12-11: 120 mg via INTRAVENOUS

## 2021-12-11 MED ORDER — LIDOCAINE 2% (20 MG/ML) 5 ML SYRINGE
INTRAMUSCULAR | Status: AC
  Filled 2021-12-11: qty 5

## 2021-12-11 MED ORDER — SODIUM CHLORIDE 0.9 % IR SOLN
Status: DC | PRN
  Administered 2021-12-11: 1000 mL

## 2021-12-11 MED ORDER — BUPIVACAINE HCL (PF) 0.25 % IJ SOLN
INTRAMUSCULAR | Status: AC
  Filled 2021-12-11: qty 30

## 2021-12-11 MED ORDER — OXYCODONE-ACETAMINOPHEN 5-325 MG PO TABS: 1.0000 | ORAL_TABLET | Freq: Four times a day (QID) | ORAL | Status: DC | PRN

## 2021-12-11 MED ORDER — SUCCINYLCHOLINE CHLORIDE 200 MG/10ML IV SOSY
PREFILLED_SYRINGE | INTRAVENOUS | Status: AC
  Filled 2021-12-11: qty 10

## 2021-12-11 MED ORDER — DEXMEDETOMIDINE (PRECEDEX) IN NS 20 MCG/5ML (4 MCG/ML) IV SYRINGE
PREFILLED_SYRINGE | INTRAVENOUS | Status: DC | PRN
  Administered 2021-12-11: 20 ug via INTRAVENOUS
  Administered 2021-12-11: 4 ug via INTRAVENOUS
  Administered 2021-12-11 (×2): 8 ug via INTRAVENOUS

## 2021-12-11 MED ORDER — BUPIVACAINE HCL (PF) 0.25 % IJ SOLN
INTRAMUSCULAR | Status: DC | PRN
Start: 2021-12-11 — End: 2021-12-11
  Administered 2021-12-11: 30 mL

## 2021-12-11 MED ORDER — FENTANYL CITRATE (PF) 250 MCG/5ML IJ SOLN
INTRAMUSCULAR | Status: DC | PRN
  Administered 2021-12-11: 100 ug via INTRAVENOUS
  Administered 2021-12-11 (×3): 50 ug via INTRAVENOUS

## 2021-12-11 MED ORDER — ORAL CARE MOUTH RINSE: 15.0000 mL | Freq: Once | OROMUCOSAL | Status: AC

## 2021-12-11 MED ORDER — FENTANYL CITRATE (PF) 100 MCG/2ML IJ SOLN
INTRAMUSCULAR | Status: AC
  Filled 2021-12-11: qty 2

## 2021-12-11 MED ORDER — SCOPOLAMINE 1 MG/3DAYS TD PT72
1.0000 | MEDICATED_PATCH | TRANSDERMAL | Status: DC
  Administered 2021-12-11: 1.5 mg via TRANSDERMAL
  Filled 2021-12-11: qty 1

## 2021-12-11 MED ORDER — ONDANSETRON HCL 4 MG/2ML IJ SOLN
INTRAMUSCULAR | Status: AC
  Filled 2021-12-11: qty 2

## 2021-12-11 MED ORDER — CHLORHEXIDINE GLUCONATE 0.12 % MT SOLN
15.0000 mL | Freq: Once | OROMUCOSAL | Status: AC
  Administered 2021-12-11: 15 mL via OROMUCOSAL
  Filled 2021-12-11: qty 15

## 2021-12-11 MED ORDER — LACTATED RINGERS IV SOLN: INTRAVENOUS | Status: DC

## 2021-12-11 MED ORDER — DEXAMETHASONE SODIUM PHOSPHATE 10 MG/ML IJ SOLN
INTRAMUSCULAR | Status: AC
  Filled 2021-12-11: qty 1

## 2021-12-11 MED ORDER — AMISULPRIDE (ANTIEMETIC) 5 MG/2ML IV SOLN
10.0000 mg | Freq: Once | INTRAVENOUS | Status: AC | PRN
  Administered 2021-12-11: 10 mg via INTRAVENOUS

## 2021-12-11 MED ORDER — OXYCODONE-ACETAMINOPHEN 5-325 MG PO TABS: 2.0000 | ORAL_TABLET | Freq: Four times a day (QID) | ORAL | 0 refills | Status: AC | PRN

## 2021-12-11 MED ORDER — SUGAMMADEX SODIUM 200 MG/2ML IV SOLN
INTRAVENOUS | Status: DC | PRN
Start: 2021-12-11 — End: 2021-12-11
  Administered 2021-12-11: 500 mg via INTRAVENOUS

## 2021-12-11 MED ORDER — PROPOFOL 10 MG/ML IV BOLUS
INTRAVENOUS | Status: DC | PRN
  Administered 2021-12-11: 10 mg via INTRAVENOUS
  Administered 2021-12-11: 20 mg via INTRAVENOUS
  Administered 2021-12-11: 200 mg via INTRAVENOUS

## 2021-12-11 MED ORDER — ROCURONIUM BROMIDE 10 MG/ML (PF) SYRINGE
PREFILLED_SYRINGE | INTRAVENOUS | Status: DC | PRN
Start: 2021-12-11 — End: 2021-12-11
  Administered 2021-12-11: 30 mg via INTRAVENOUS
  Administered 2021-12-11: 50 mg via INTRAVENOUS

## 2021-12-11 MED ORDER — ROCURONIUM BROMIDE 10 MG/ML (PF) SYRINGE
PREFILLED_SYRINGE | INTRAVENOUS | Status: AC
  Filled 2021-12-11: qty 10

## 2021-12-11 MED ORDER — FENTANYL CITRATE (PF) 250 MCG/5ML IJ SOLN
INTRAMUSCULAR | Status: AC
  Filled 2021-12-11: qty 5

## 2021-12-11 MED ORDER — PROMETHAZINE HCL 25 MG/ML IJ SOLN: 6.2500 mg | INTRAMUSCULAR | Status: DC | PRN

## 2021-12-11 MED ORDER — FENTANYL CITRATE (PF) 100 MCG/2ML IJ SOLN
25.0000 ug | INTRAMUSCULAR | Status: DC | PRN
  Administered 2021-12-11 (×2): 50 ug via INTRAVENOUS

## 2021-12-11 MED ORDER — DEXMEDETOMIDINE (PRECEDEX) IN NS 20 MCG/5ML (4 MCG/ML) IV SYRINGE
PREFILLED_SYRINGE | INTRAVENOUS | Status: AC
  Filled 2021-12-11: qty 5

## 2021-12-11 MED ORDER — DEXAMETHASONE SODIUM PHOSPHATE 10 MG/ML IJ SOLN
INTRAMUSCULAR | Status: DC | PRN
Start: 2021-12-11 — End: 2021-12-11
  Administered 2021-12-11: 10 mg via INTRAVENOUS

## 2021-12-11 MED ORDER — AMISULPRIDE (ANTIEMETIC) 5 MG/2ML IV SOLN
INTRAVENOUS | Status: AC
  Filled 2021-12-11: qty 4

## 2021-12-11 MED ORDER — ACETAMINOPHEN 500 MG PO TABS
1000.0000 mg | ORAL_TABLET | Freq: Once | ORAL | Status: AC
  Administered 2021-12-11: 1000 mg via ORAL
  Filled 2021-12-11: qty 2

## 2021-12-11 MED ORDER — SUGAMMADEX SODIUM 500 MG/5ML IV SOLN
INTRAVENOUS | Status: AC
  Filled 2021-12-11: qty 5

## 2021-12-11 MED ORDER — OXYCODONE-ACETAMINOPHEN 5-325 MG PO TABS
ORAL_TABLET | ORAL | Status: AC
  Administered 2021-12-11: 1
  Filled 2021-12-11: qty 1

## 2021-12-11 MED ORDER — LIDOCAINE 2% (20 MG/ML) 5 ML SYRINGE
INTRAMUSCULAR | Status: DC | PRN
Start: 2021-12-11 — End: 2021-12-11
  Administered 2021-12-11: 100 mg via INTRAVENOUS

## 2021-12-11 MED ORDER — MIDAZOLAM HCL 2 MG/2ML IJ SOLN
INTRAMUSCULAR | Status: AC
  Filled 2021-12-11: qty 2

## 2021-12-11 SURGICAL SUPPLY — 33 items
DECANTER SPIKE VIAL GLASS SM (MISCELLANEOUS) ×2 IMPLANT
DERMABOND ADVANCED (GAUZE/BANDAGES/DRESSINGS) ×1
DERMABOND ADVANCED .7 DNX12 (GAUZE/BANDAGES/DRESSINGS) IMPLANT
DRSG OPSITE POSTOP 3X4 (GAUZE/BANDAGES/DRESSINGS) ×2 IMPLANT
DURAPREP 26ML APPLICATOR (WOUND CARE) ×2 IMPLANT
ELECT REM PT RETURN 9FT ADLT (ELECTROSURGICAL) ×2
ELECTRODE REM PT RTRN 9FT ADLT (ELECTROSURGICAL) ×1 IMPLANT
FORCEPS CUTTING 33CM 5MM (CUTTING FORCEPS) ×1 IMPLANT
GLOVE SURG ENC MOIS LTX SZ6.5 (GLOVE) ×2 IMPLANT
GLOVE SURG UNDER POLY LF SZ7 (GLOVE) ×8 IMPLANT
GOWN STRL REUS W/ TWL LRG LVL3 (GOWN DISPOSABLE) ×2 IMPLANT
GOWN STRL REUS W/TWL LRG LVL3 (GOWN DISPOSABLE) ×2
IRRIG SUCT STRYKERFLOW 2 WTIP (MISCELLANEOUS) ×2
IRRIGATION SUCT STRKRFLW 2 WTP (MISCELLANEOUS) IMPLANT
KIT TURNOVER KIT B (KITS) ×2 IMPLANT
MANIPULATOR UTERINE 4.5 ZUMI (MISCELLANEOUS) ×2 IMPLANT
NEEDLE INSUFFLATION 150MM (ENDOMECHANICALS) ×1 IMPLANT
PACK LAPAROSCOPY BASIN (CUSTOM PROCEDURE TRAY) ×2 IMPLANT
PACK TRENDGUARD 450 HYBRID PRO (MISCELLANEOUS) IMPLANT
POUCH SPECIMEN RETRIEVAL 10MM (ENDOMECHANICALS) ×1 IMPLANT
PROTECTOR NERVE ULNAR (MISCELLANEOUS) ×4 IMPLANT
SET TUBE SMOKE EVAC HIGH FLOW (TUBING) ×2 IMPLANT
SLEEVE ENDOPATH XCEL 5M (ENDOMECHANICALS) ×2 IMPLANT
SUT VICRYL 0 UR6 27IN ABS (SUTURE) ×2 IMPLANT
SUT VICRYL 4-0 PS2 18IN ABS (SUTURE) ×2 IMPLANT
SYR 5ML LL (SYRINGE) ×2 IMPLANT
TOWEL GREEN STERILE FF (TOWEL DISPOSABLE) ×4 IMPLANT
TRAY FOLEY W/BAG SLVR 14FR (SET/KITS/TRAYS/PACK) ×2 IMPLANT
TRENDGUARD 450 HYBRID PRO PACK (MISCELLANEOUS) ×2
TROCAR 12M 150ML BLUNT (TROCAR) ×1 IMPLANT
TROCAR BALLN 12MMX100 BLUNT (TROCAR) ×2 IMPLANT
TROCAR XCEL NON-BLD 5MMX100MML (ENDOMECHANICALS) ×2 IMPLANT
WARMER LAPAROSCOPE (MISCELLANEOUS) ×2 IMPLANT

## 2021-12-11 NOTE — H&P (Signed)
Chief complaint: Ectopic pregnancy   History present illness: 36 year old G1 who presented for new GYN exam about 1 week ago with new finding of pregnancy.  At that time she had mild left lower quadrant pain.  On January 31 she was diagnosed with a right-sided ectopic pregnancy with a beta-hCG of 10,000 and adnexal mass 1.6 x 1.6 x 1.4 cm.  At that time she was counseled on the high risk of failure with methotrexate given beta above 10,000 but given her strong desire to avoid surgery patient opted for methotrexate.  Since that time she has developed mild cramping in the right lower quadrant.  Patient did have episode of light spotting over the weekend.  She had a day for beta-hCG of 13,321 and a day 7 beta-hCG today of 12,859.  Repeat ultrasound today showed no free fluid but to adnexal masses on the right one measuring 1.9 x 1.8 and the other measuring 1.7 x 1.6, both with ring of fire but no definitive yolk sac.  Endometrial stripe measured 16 mm  Past surgical history: Ruptured appendix with laparoscopic removal Past medical history: PCOS, morbid obesity, irritable bowel syndromes, nonepileptic seizures for which she has been referred to cognitive behavioral therapist and released from her neurologist Medications: Prenatal vitamin Allergies: Ibuprofen per patient causes IBS flare   Past Medical History:  Diagnosis Date   Allergy    Arm fracture    right and left   Asthma    Depression    GERD (gastroesophageal reflux disease)    History of 2019 novel coronavirus disease (COVID-19) 04/29/2020   + Ag test - can see in Care everywhere   Irritable bowel    Leg fracture, left    PCOS (polycystic ovarian syndrome)    Seizures ([place])    month ago (Dec 2022)   Ulcerative colitis [facility])     Past Surgical History:  Procedure Laterality Date   APPENDECTOMY     BIOPSY  06/06/2020   Procedure: BIOPSY;  Surgeon: Gatha Mayer, MD;  [facility];  Service: Endoscopy;;   COLONOSCOPY WITH  PROPOFOL N/A 06/06/2020   Procedure: COLONOSCOPY WITH PROPOFOL;  Surgeon: Gatha Mayer, MD;  [facility];  Service: Endoscopy;  Laterality: N/A;   ESOPHAGOGASTRODUODENOSCOPY (EGD) WITH PROPOFOL N/A 06/06/2020   Procedure: ESOPHAGOGASTRODUODENOSCOPY (EGD) WITH PROPOFOL;  Surgeon: Gatha Mayer, MD;  [facility];  Service: Endoscopy;  Laterality: N/A;   HAND SURGERY Left    x3    Physical exam: Vitals:   12/11/21 1234  BP: 127/71  Pulse: 62  Resp: 17  Temp: 98.5 F (36.9 C)  TempSrc: Oral  SpO2: 97%  Weight: 125.6 kg  Height: 5' 7"  (1.702 m)   General: Well-appearing, obese, no distress Cardiovascular: Regular rate and rhythm Pulmonary: Clear to auscultation bilaterally Abdomen: Obese, nontender, nondistended GU: Deferred to the OR Lower extremity: Nontender, no edema  CBC Latest Ref Rng & Units 12/05/2021 07/06/2021 05/09/2020  WBC 4.0 - 10.5 K/uL 10.4 10.7 12.1(H)  Hemoglobin 12.0 - 15.0 g/dL 12.7 13.3 14.4  Hematocrit 36.0 - 46.0 % 38.7 39.7 44.8  Platelets 150 - 400 K/uL 371 391 281   A+, antibody negative Beta-hCG today 12,859  Assessment plan: 36 year old G1 with right ectopic pregnancy without evidence of rupture but failed methotrexate here for definitive treatment with laparoscopic right salpingectomy.  Patient understands risk benefits of procedure.  I discussed with patient increased risk given her prior laparoscopy, morbid obesity, ruptured appendix, ulcerative colitis all which  increased risk of adhesions and damage to surrounding organs, blood loss, infection.  Patient understands risks and agrees to proceed with surgery.  Ala Dach 12/11/2021 1:47 PM

## 2021-12-11 NOTE — Transfer of Care (Signed)
Immediate Anesthesia Transfer of Care Note  Patient: Barbara Orozco  Procedure(s) Performed: LAPAROSCOPIC RIGHT SALPINGECTOMY WITH REMOVAL OF ECTOPIC PREGNANCY, lysis of adhesions (Right)  Patient Location: PACU  Anesthesia Type:General  Level of Consciousness: pateint uncooperative and confused  Airway & Oxygen Therapy: Patient Spontanous Breathing  Post-op Assessment: Report given to RN and Post -op Vital signs reviewed and stable  Post vital signs: Reviewed and stable  Last Vitals:  Vitals Value Taken Time  BP 102/63 12/11/21 1635  Temp    Pulse 70   Resp 16 12/11/21 1637  SpO2 100   Vitals shown include unvalidated device data.  Last Pain:  Vitals:   12/11/21 1244  TempSrc:   PainSc: 0-No pain         Complications: No notable events documented.

## 2021-12-11 NOTE — Op Note (Signed)
12/11/2021  4:27 PM  PATIENT:  Barbara Orozco  36 y.o. female  PRE-OPERATIVE DIAGNOSIS: Right  Ectopic Pregnancy  POST-OPERATIVE DIAGNOSIS:  Right Ectopic Pregnancy, omental adhesions, stage III endometriosis  PROCEDURE:  Procedure(s): LAPAROSCOPIC RIGHT SALPINGECTOMY WITH REMOVAL OF ECTOPIC PREGNANCY, lysis of adhesions (Right)  SURGEON:  Surgeon(s) and Role:    Aloha Gell, MD - Primary  PHYSICIAN ASSISTANT: None  ASSISTANTS surgical tech ANESTHESIA:   local and general  EBL:  20 mL   BLOOD ADMINISTERED:none  DRAINS: Urinary Catheter (Foley)   LOCAL MEDICATIONS USED:  MARCAINE     SPECIMEN: Right fallopian tube with ectopic pregnancy  DISPOSITION OF SPECIMEN:  PATHOLOGY  COUNTS:  YES  TOURNIQUET:  * No tourniquets in log *  DICTATION: .Note written in EPIC  PLAN OF CARE: Discharge to home after PACU  PATIENT DISPOSITION:  PACU - hemodynamically stable.   Delay start of Pharmacological VTE agent (>24hrs) due to surgical blood loss or risk of bleeding: yes   Estimated blood loss: Less than 50 Complications: None Antibiotics: None  Findings: Liver with Fitz-Hugh Curtis adhesions, otherwise normal-appearing liver, normal gallbladder, normal stomach, normal uterus with two 3 mm endometriotic implants on the posterior lower segment just above the cervix, small deep endometriotic implants on the left round ligament, multiple filmy adhesions around the left tube and ovary including adhesions to the small bowel, minimal normal-appearing left fimbria, normal ovaries bilaterally, clean anterior and posterior cul-de-sac without significant adhesions, right fallopian tube distended consistent with ectopic pregnancy.  Indications: This is a 36 year old G3 P0-0-2-0 with history of MAB x2 who presented for new pregnancy visit found to have right ectopic pregnancy.  Patient initially treated with methotrexate despite beta hCG returning at 10,000.  After 1 week patient showed  inadequate response to methotrexate and had stable right-sided adnexal mass consistent with ectopic pregnancy.  Patient then consented to laparoscopic salpingectomy.  Procedure: After discussion of the diagnosis of failed methotrexate treatment for right ectopic pregnancy recommendation was made to proceed with laparoscopic right salpingectomy.     Patient was prepped and draped in the normal sterile fashion in dorsal supine lithotomy position. A bimanual examination was done to assess the size and position of the uterus. A sterile speculum was placed in the vagina and a single-tooth tenaculum used to grasp the anterior lip of the cervix. The cervix was serially dilated to a #19 Hank. With traction on the tenaculum that was placed at 12:00 at the cervix, a ZUMI uterine manipulator was advanced through the cervical os and into the uterus. The speculum was then removed.  Gloves were changed attention was turned to the patient's abdomen. 10 cc of half percent Marcaine were used in the infraumbilical fold and in the left and right lower quadrants. A 10 mm skin incision was made in the infraumbilical fold and a Verees needle was inserted sharply into the peritoneal cavity. A saline filled syringe was used to confirm intraperitoneal placement. The peritoneum was then insufflated with CO2 gas to 15 mm of mercury. An 38m non-bladed trocar was then inserted into the peritoneal cavity.  Initial past did not have proper placement and a second pass was done.  This was placed sufficiently into the peritoneal cavity.  Proper placement was confirmed by the laparoscope. The pelvis was fully evaluated with findings as above. Right and left lower quadrant sites were marked, local anesthetic was infused, and 5 mm skin incisions were made in the right left lower cord. Non-bladed 5 mm trochars  were then inserted under direct visualization.  Grasping instruments were placed through the lower ports to aid in visualization of the  patient's abdomen and pelvis. The decision was then made to proceed with right salpingectomy. The gyrus device was used to serially transect and cauterize the tube, starting at the fimbriated end. Serial cautery and transection were done along the mesosalpinx for several centimeters. With careful attention to the right infundibulopelvic ligament I was sure to stay in the mesosalpinx which was serially cauterized and transected.  Once free and hemostasis was assured, I switched to a 5 mm camera placed a Endo Catch bag through the umbilicus port. The removed tube with ectopic pregnancy was placed in the Endo Catch bag and removed through the umbilical port.   Further evaluation of the pelvis was done.  An endometriotic implant was noted on the left round ligament and 2 endometriotic implants noted on the posterior lower uterus.  Significant filmy adhesions were noted in the left pelvis.  These incorporated the tube, ovary and small bowel.  Given patient's possible desire for pregnancy in the future I felt it was prudent to proceed with lysis of adhesions.  A monopolar scissor was used to sharply dissect most of the adhesions.  Very limited cautery was used.  After lysis of adhesions the tube was free and in good proximity to the ovary.  The bowel was free from the adnexal structures.  Good hemostasis was noted.  Despite the lysis of adhesion there was very limited normal-appearing left fimbriated tissue.    I then changed camera to a right lateral port and used the monopolar scissors through the umbilical port to release anterior wall omental adhesions that were between my umbilical port and my left lower quadrant port.  Good hemostasis was noted.    No significant blood was noted and no irrigation was done.  20 cc of quarter percent Marcaine were placed over the right mesosalpinx at the area of the transected and removed tube.   The trochars were removed under direct visualization. Pneumoperitoneum was  released.  Using S retractors to visualize the umbilical fascia Kocher clamps were placed on the fascia which was then closed with a single figure-of-eight of 0 Vicryl.  The skin incisions were closed with Dermabond after ensuring hemostasis.  The ZUMI uterine manipulator was removed. The vagina was hemostatic.  Sponge lap and needle counts were correct x3. Patient was woken from general anesthesia having tolerated the procedure well.

## 2021-12-11 NOTE — Brief Op Note (Signed)
12/11/2021  4:27 PM  PATIENT:  Barbara Orozco  36 y.o. female  PRE-OPERATIVE DIAGNOSIS: Right  Ectopic Pregnancy  POST-OPERATIVE DIAGNOSIS:  Right Ectopic Pregnancy, omental adhesions, stage III endometriosis  PROCEDURE:  Procedure(s): LAPAROSCOPIC RIGHT SALPINGECTOMY WITH REMOVAL OF ECTOPIC PREGNANCY, lysis of adhesions (Right)  SURGEON:  Surgeon(s) and Role:    Aloha Gell, MD - Primary  PHYSICIAN ASSISTANT: None  ASSISTANTS surgical tech ANESTHESIA:   local and general  EBL:  20 mL   BLOOD ADMINISTERED:none  DRAINS: Urinary Catheter (Foley)   LOCAL MEDICATIONS USED:  MARCAINE     SPECIMEN: Right fallopian tube with ectopic pregnancy  DISPOSITION OF SPECIMEN:  PATHOLOGY  COUNTS:  YES  TOURNIQUET:  * No tourniquets in log *  DICTATION: .Note written in EPIC  PLAN OF CARE: Discharge to home after PACU  PATIENT DISPOSITION:  PACU - hemodynamically stable.   Delay start of Pharmacological VTE agent (>24hrs) due to surgical blood loss or risk of bleeding: yes

## 2021-12-11 NOTE — Anesthesia Preprocedure Evaluation (Addendum)
Anesthesia Evaluation  Patient identified by MRN, date of birth, ID band Patient awake    Reviewed: Allergy & Precautions, NPO status , Patient's Chart, lab work & pertinent test results  History of Anesthesia Complications (+) Emergence Delirium and history of anesthetic complications (patient states she wakes up very aggressive)  Airway Mallampati: II  TM Distance: >3 FB Neck ROM: Full    Dental no notable dental hx.    Pulmonary asthma , former smoker,    Pulmonary exam normal breath sounds clear to auscultation       Cardiovascular negative cardio ROS Normal cardiovascular exam Rhythm:Regular Rate:Normal     Neuro/Psych Seizures - (non-epileptic seizures per neurology, likely psychogenic),  PSYCHIATRIC DISORDERS Depression    GI/Hepatic Neg liver ROS, PUD, GERD  Medicated,  Endo/Other  Morbid obesityPCOS  Renal/GU negative Renal ROS  negative genitourinary   Musculoskeletal negative musculoskeletal ROS (+)   Abdominal   Peds negative pediatric ROS (+)  Hematology negative hematology ROS (+)   Anesthesia Other Findings   Reproductive/Obstetrics negative OB ROS                           Anesthesia Physical Anesthesia Plan  ASA: 3 and emergent  Anesthesia Plan: General   Post-op Pain Management: Tylenol PO (pre-op)   Induction: Intravenous and Rapid sequence  PONV Risk Score and Plan: 3 and Treatment may vary due to age or medical condition, Scopolamine patch - Pre-op, Midazolam, Ondansetron and Dexamethasone  Airway Management Planned: Oral ETT  Additional Equipment: None  Intra-op Plan:   Post-operative Plan: Extubation in OR  Informed Consent: I have reviewed the patients History and Physical, chart, labs and discussed the procedure including the risks, benefits and alternatives for the proposed anesthesia with the patient or authorized representative who has indicated  his/her understanding and acceptance.     Dental advisory given  Plan Discussed with: Anesthesiologist, CRNA and Surgeon  Anesthesia Plan Comments: (GETA. RSI. Patient ate a piece of toast around 7-8pm. Precedex for the wakeup. Tanna Furry, MD  )      Anesthesia Quick Evaluation

## 2021-12-11 NOTE — Anesthesia Procedure Notes (Signed)
Procedure Name: Intubation Date/Time: 12/11/2021 2:54 PM Performed by: Erick Colace, CRNA Pre-anesthesia Checklist: Patient identified, Emergency Drugs available, Suction available and Patient being monitored Patient Re-evaluated:Patient Re-evaluated prior to induction Oxygen Delivery Method: Circle system utilized Preoxygenation: Pre-oxygenation with 100% oxygen Induction Type: IV induction, Cricoid Pressure applied and Rapid sequence Laryngoscope Size: Mac and 4 Grade View: Grade I Tube type: Oral Tube size: 7.0 mm Number of attempts: 1 Airway Equipment and Method: Stylet and Oral airway Placement Confirmation: ETT inserted through vocal cords under direct vision, positive ETCO2 and breath sounds checked- equal and bilateral Secured at: 21 cm Tube secured with: Tape Dental Injury: Teeth and Oropharynx as per pre-operative assessment

## 2021-12-12 ENCOUNTER — Encounter (HOSPITAL_COMMUNITY): Payer: Self-pay | Admitting: Obstetrics

## 2021-12-12 NOTE — Anesthesia Postprocedure Evaluation (Signed)
Anesthesia Post Note  Patient: Barbara Orozco  Procedure(s) Performed: LAPAROSCOPIC RIGHT SALPINGECTOMY WITH REMOVAL OF ECTOPIC PREGNANCY, lysis of adhesions (Right)     Patient location during evaluation: PACU Anesthesia Type: General Level of consciousness: awake and alert Pain management: pain level controlled Vital Signs Assessment: post-procedure vital signs reviewed and stable Respiratory status: spontaneous breathing, nonlabored ventilation, respiratory function stable and patient connected to nasal cannula oxygen Cardiovascular status: blood pressure returned to baseline and stable Postop Assessment: no apparent nausea or vomiting Anesthetic complications: no   No notable events documented.  Last Vitals:  Vitals:   12/11/21 1730 12/11/21 1745  BP: 112/69 114/88  Pulse: (!) 55 (!) 58  Resp: 13 19  Temp:  36.7 C  SpO2: 95% 97%    Last Pain:  Vitals:   12/11/21 1801  TempSrc:   PainSc: 6                  Elandra Powell P Caidance Sybert

## 2021-12-13 LAB — SURGICAL PATHOLOGY

## 2021-12-15 DIAGNOSIS — Z09 Encounter for follow-up examination after completed treatment for conditions other than malignant neoplasm: Secondary | ICD-10-CM | POA: Diagnosis not present

## 2021-12-15 DIAGNOSIS — N946 Dysmenorrhea, unspecified: Secondary | ICD-10-CM | POA: Diagnosis not present

## 2021-12-15 DIAGNOSIS — R109 Unspecified abdominal pain: Secondary | ICD-10-CM | POA: Diagnosis not present

## 2021-12-15 DIAGNOSIS — N803 Endometriosis of pelvic peritoneum, unspecified: Secondary | ICD-10-CM | POA: Diagnosis not present

## 2021-12-28 ENCOUNTER — Telehealth: Payer: Self-pay

## 2021-12-28 NOTE — Telephone Encounter (Signed)
Pt was sent a mychart message and a letter advising appt schedule with Hetty Blend, NP-C has been cancelled. Pt advised to call the office if she would like to reschedule with Burnard Hawthorne, PA-C.

## 2022-01-06 ENCOUNTER — Other Ambulatory Visit: Payer: Self-pay | Admitting: Internal Medicine

## 2022-01-06 DIAGNOSIS — Z8711 Personal history of peptic ulcer disease: Secondary | ICD-10-CM

## 2022-01-11 ENCOUNTER — Telehealth: Payer: Self-pay | Admitting: Internal Medicine

## 2022-01-11 NOTE — Telephone Encounter (Signed)
Pt came to the office to drop FMLA form. She filled out release of information and pt intake forms. She will call back tomorrow to provide fax number where FMLA needs to be faxed over. Forms were given to CMA PJ. ?

## 2022-01-12 NOTE — Telephone Encounter (Signed)
I spoke with Barbara Orozco and she is aware we are working on her paperwork. I have placed it in Dr Celesta Aver office. She is going to call us back with the # to fax it to. ?

## 2022-01-12 NOTE — Telephone Encounter (Signed)
Patient called back providing [place] the fax number. [phone]. Patient also stated she would have someone drop of a cover sheet on Monday.  ?

## 2022-01-15 ENCOUNTER — Other Ambulatory Visit: Payer: Self-pay | Admitting: General Surgery

## 2022-01-15 ENCOUNTER — Telehealth: Payer: Self-pay

## 2022-01-15 DIAGNOSIS — K603 Anal fistula: Secondary | ICD-10-CM | POA: Diagnosis not present

## 2022-01-15 NOTE — H&P (View-Only) (Signed)
?  PROVIDER:  Monico Blitz, MD ?  ?MRN: [id number] ?DOB: Oct 27, 1986 ?DATE OF ENCOUNTER: 01/15/2022 ?Subjective  ?  ?Chief Complaint: Follow-up and Rectal Pain ?  ?  ?  ?History of Present Illness: ?Barbara Orozco is a 36 y.o. female who is seen today for anal fistula. ?Patient last seen May 2022.  She presented to the office with anal pain.  She was undergoing a work-up for possible IBD.  She states over the past 6 years she has had 7-8 perianal abscesses.  1 of these was lanced but the remaining resolved with antibiotics and spontaneous drainage.  She states all abscesses were in the same location except for 1.  She had been taking steroids for possible IBD and colonoscopy in 2021 showed no sign of inflammatory bowel disease and showed she was taken off the steroids at that time.  Since that time her symptoms have worsened.  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  I recommended an exam under anesthesia with possible seton placement.  Patient was unable to do this due to no further vacation time from work.  She elected to wait until this year.  She returns to the office for reevaluation.  She states her symptoms are very similar and she continues to have trouble with diarrhea. ?  ?  ?  ?Objective:  ?  ?  ?   ?Vitals:  ?  01/15/22 1117  ?Pulse: 95  ?Temp: 36.7 ?C (98 ?F)  ?SpO2: 97%  ?Weight: (!) 128.3 kg (282 lb 12.8 oz)  ?Height: 170.2 cm (5' 7" )  ?  ?  ?  ?General appearance - alert, well appearing, and in no distress ?Chest - CTA ?Heart - normal rate and regular rhythm ?Abdomen - soft ?Rectal - Posterior midline external opening consistent with probable intersphincteric abscess ?  ?  ?Labs, Imaging and Diagnostic Testing: ?  ?  ?Assessment and Plan:  ?Diagnoses and all orders for this visit: ?  ?Anal fistula ?  ?  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  She has chronic diarrhea and some difficulty with  incontinence at baseline.  Therefore I recommended exam under anesthesia and seton placement.  Given that her symptoms have worsened after coming off of the steroids I think it is reasonable to follow-up with GI for reevaluation. ?  ?No follow-ups on file. ?

## 2022-01-15 NOTE — Telephone Encounter (Signed)
-----   Message from Gatha Mayer, MD sent at 01/15/2022  2:47 PM EDT ----- ?Regarding: Appointment ?I will have my nurse Remo Lipps arrange for her to see me in next available spot ?----- Message ----- ?From: Leighton Ruff, MD ?Sent: 0/30/0923  11:39 AM EDT ?To: Gatha Mayer, MD ? ?Glendell Docker, This is a lady who has chronic diarrhea.  It appears that she was on steroids for approximately a year and her symptoms improved quite a bit.  It then looks like you did a colonoscopy after being on steroids for this tine, and this showed no signs of IBD and therefore the steroids were discontinued.  She states that since that time she has had a steady decline in her symptoms and continues to have significant diarrhea but denies any rectal bleeding or weight loss.  She appears to also have a posterior midline anal fistula.  There are no signs of Crohn's disease on her perianal exam, but I believe we need to get better control of her diarrhea before I can definitively repair her fistula.  Would you mind seeing her and evaluating her now that she is off steroids to see if there is anything else that comes to mind for you?  In the meantime, I will place a seton to control her perirectal symptoms.  ? ?Thank you ? ?Elmo Putt  ? ? ?

## 2022-01-15 NOTE — H&P (Signed)
?  PROVIDER:  Monico Blitz, MD ?  ?MRN: [id number] ?DOB: 12/23/1985 ?DATE OF ENCOUNTER: 01/15/2022 ?Subjective  ?  ?Chief Complaint: Follow-up and Rectal Pain ?  ?  ?  ?History of Present Illness: ?Barbara Orozco is a 36 y.o. female who is seen today for anal fistula. ?Patient last seen May 2022.  She presented to the office with anal pain.  She was undergoing a work-up for possible IBD.  She states over the past 6 years she has had 7-8 perianal abscesses.  1 of these was lanced but the remaining resolved with antibiotics and spontaneous drainage.  She states all abscesses were in the same location except for 1.  She had been taking steroids for possible IBD and colonoscopy in 2021 showed no sign of inflammatory bowel disease and showed she was taken off the steroids at that time.  Since that time her symptoms have worsened.  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  I recommended an exam under anesthesia with possible seton placement.  Patient was unable to do this due to no further vacation time from work.  She elected to wait until this year.  She returns to the office for reevaluation.  She states her symptoms are very similar and she continues to have trouble with diarrhea. ?  ?  ?  ?Objective:  ?  ?  ?   ?Vitals:  ?  01/15/22 1117  ?Pulse: 95  ?Temp: 36.7 ?C (98 ?F)  ?SpO2: 97%  ?Weight: (!) 128.3 kg (282 lb 12.8 oz)  ?Height: 170.2 cm (5' 7" )  ?  ?  ?  ?General appearance - alert, well appearing, and in no distress ?Chest - CTA ?Heart - normal rate and regular rhythm ?Abdomen - soft ?Rectal - Posterior midline external opening consistent with probable intersphincteric abscess ?  ?  ?Labs, Imaging and Diagnostic Testing: ?  ?  ?Assessment and Plan:  ?Diagnoses and all orders for this visit: ?  ?Anal fistula ?  ?  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  She has chronic diarrhea and some difficulty with  incontinence at baseline.  Therefore I recommended exam under anesthesia and seton placement.  Given that her symptoms have worsened after coming off of the steroids I think it is reasonable to follow-up with GI for reevaluation. ?  ?No follow-ups on file. ?

## 2022-01-15 NOTE — Telephone Encounter (Signed)
Pt scheduled for an office visit on 02/07/2022 at 9:50 with Dr. Carlean Purl: Pt made aware;Pt verbalized understanding with all questions answered.  ? ?

## 2022-01-16 NOTE — Telephone Encounter (Signed)
She didn't get a chance to come by yesterday to drop off the cover sheet so she is going to email it to me.  ?

## 2022-01-16 NOTE — Telephone Encounter (Signed)
Her FMLA form has been faxed to fax # [phone] and got confirmation it went thru. I called and told her this has been done and confirmed address to mail her a copy for her records. ?

## 2022-01-19 NOTE — Telephone Encounter (Signed)
Patient called states the FMLA letter that was sent did not include her being able to go for bathroom breaks at work and she's asking if it's possible for the paperwork to be resent with that request. Patient can be contacted at [phone]. ?

## 2022-01-19 NOTE — Telephone Encounter (Signed)
I have re-faxed the FMLA paperwork with the bathroom breaks requested and I also included her up coming surgery date:01/30/2022 with [facility]. Barbara Orozco aware this has been done. Got confirmation it went thru. ?

## 2022-01-25 ENCOUNTER — Other Ambulatory Visit: Payer: Self-pay

## 2022-01-25 ENCOUNTER — Encounter (HOSPITAL_BASED_OUTPATIENT_CLINIC_OR_DEPARTMENT_OTHER): Payer: Self-pay | Admitting: General Surgery

## 2022-01-25 NOTE — Progress Notes (Signed)
Spoke with dr Annie Main houser mda and pt to be moved to [facility] main or  ?Due to last follow up with dr Ellouise Newer neurology was 02-10-2021 and pt last seizure was 12-28-2021 per pt, called alicia @ [facility] triage and made aware pt to be moved to [facility] main or due to last follow up neurology dr Ellouise Newer was 02-10-2021 and follow up was due in 4 months and pt last seizure was [id number], alicia to let brenda garrett know.  ? ?Pt received medications to take am of surgery instructions:  ?Albuterol inhaler prn/bring inhaler, pantoprazole, zofran prn, imodium AD prn, aygestin. ?

## 2022-01-26 ENCOUNTER — Encounter (HOSPITAL_COMMUNITY): Payer: Self-pay | Admitting: *Deleted

## 2022-01-26 NOTE — Progress Notes (Addendum)
COVID swab appointment: N/A ? ?COVID Vaccine Completed:  No ?Date COVID Vaccine completed: ?Has received booster: ?COVID vaccine manufacturer: [place]  ? ?Date of COVID positive in last 90 days:  No ? ?PCP - Retired ?Cardiologist - N/A ?Neurologist - Barbara Newer, MD (last appt 02-10-21) ? ?Chest x-ray - N/A ?EKG - N/A ?Stress Test - N/A ?ECHO - N/A ?Cardiac Cath - N/A ?Pacemaker/ICD device last checked: ?Spinal Cord Stimulator: ? ?Bowel Prep - N/A ? ?Sleep Study - N/A ?CPAP -  ? ?Fasting Blood Sugar - N/A ?Checks Blood Sugar _____ times a day ? ?Blood Thinner Instructions:  N/A ?Aspirin Instructions: ?Last Dose: ? ?Activity level:  Can go up a flight of stairs and perform activities of daily living without stopping and without symptoms of chest pain or shortness of breath. ?   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 ?Patient denies shortness of breath, fever, cough and chest pain at PAT appointment ? ? ?Patient verbalized understanding of instructions that were given to them at the PAT appointment. Patient was also instructed that they will need to review over the PAT instructions again at home before surgery.  ?

## 2022-01-30 ENCOUNTER — Ambulatory Visit (HOSPITAL_COMMUNITY): Payer: 59 | Admitting: Anesthesiology

## 2022-01-30 ENCOUNTER — Ambulatory Visit (HOSPITAL_BASED_OUTPATIENT_CLINIC_OR_DEPARTMENT_OTHER): Payer: 59 | Admitting: Anesthesiology

## 2022-01-30 ENCOUNTER — Other Ambulatory Visit: Payer: Self-pay

## 2022-01-30 ENCOUNTER — Encounter (HOSPITAL_COMMUNITY): Payer: Self-pay | Admitting: General Surgery

## 2022-01-30 ENCOUNTER — Ambulatory Visit (HOSPITAL_COMMUNITY)
Admission: RE | Admit: 2022-01-30 | Discharge: 2022-01-30 | Disposition: A | Discharge status: Home / Self Care | Payer: 59 | Attending: General Surgery | Admitting: General Surgery

## 2022-01-30 ENCOUNTER — Encounter (HOSPITAL_COMMUNITY)
Admission: RE | Disposition: A | Discharge status: Home / Self Care | Payer: Self-pay | Source: Home / Self Care | Attending: General Surgery

## 2022-01-30 DIAGNOSIS — E282 Polycystic ovarian syndrome: Secondary | ICD-10-CM

## 2022-01-30 DIAGNOSIS — R159 Full incontinence of feces: Secondary | ICD-10-CM | POA: Diagnosis not present

## 2022-01-30 DIAGNOSIS — Z01818 Encounter for other preprocedural examination: Secondary | ICD-10-CM

## 2022-01-30 DIAGNOSIS — K279 Peptic ulcer, site unspecified, unspecified as acute or chronic, without hemorrhage or perforation: Secondary | ICD-10-CM | POA: Diagnosis not present

## 2022-01-30 DIAGNOSIS — Z8711 Personal history of peptic ulcer disease: Secondary | ICD-10-CM | POA: Diagnosis not present

## 2022-01-30 DIAGNOSIS — Z6841 Body Mass Index (BMI) 40.0 and over, adult: Secondary | ICD-10-CM

## 2022-01-30 DIAGNOSIS — K603 Anal fistula: Secondary | ICD-10-CM | POA: Diagnosis not present

## 2022-01-30 DIAGNOSIS — K529 Noninfective gastroenteritis and colitis, unspecified: Secondary | ICD-10-CM | POA: Insufficient documentation

## 2022-01-30 HISTORY — DX: Endometriosis, unspecified: N80.9

## 2022-01-30 HISTORY — PX: RECTAL EXAM UNDER ANESTHESIA: SHX6399

## 2022-01-30 HISTORY — DX: Presence of spectacles and contact lenses: Z97.3

## 2022-01-30 HISTORY — DX: Personal history of other infectious and parasitic diseases: Z86.19

## 2022-01-30 HISTORY — DX: Conversion disorder with seizures or convulsions: F44.5

## 2022-01-30 HISTORY — DX: Unspecified convulsions: R56.9

## 2022-01-30 HISTORY — PX: PLACEMENT OF SETON: SHX6029

## 2022-01-30 HISTORY — DX: Other complications of anesthesia, initial encounter: T88.59XA

## 2022-01-30 HISTORY — DX: Zoster without complications: B02.9

## 2022-01-30 HISTORY — DX: Anal fistula: K60.3

## 2022-01-30 HISTORY — DX: Anal fistula, unspecified: K60.30

## 2022-01-30 HISTORY — DX: Pain in unspecified foot: M79.673

## 2022-01-30 LAB — PREGNANCY, URINE: Preg Test, Ur: NEGATIVE

## 2022-01-30 SURGERY — PLACEMENT, SETON
Anesthesia: General

## 2022-01-30 MED ORDER — MIDAZOLAM HCL 2 MG/2ML IJ SOLN
INTRAMUSCULAR | Status: AC
  Filled 2022-01-30: qty 2

## 2022-01-30 MED ORDER — BUPIVACAINE-EPINEPHRINE 0.5% -1:200000 IJ SOLN
INTRAMUSCULAR | Status: DC | PRN
  Administered 2022-01-30: 30 mL

## 2022-01-30 MED ORDER — ONDANSETRON HCL 4 MG/2ML IJ SOLN
INTRAMUSCULAR | Status: AC
  Filled 2022-01-30: qty 2

## 2022-01-30 MED ORDER — OXYCODONE HCL 5 MG PO TABS: 5.0000 mg | ORAL_TABLET | Freq: Once | ORAL | Status: DC | PRN

## 2022-01-30 MED ORDER — OXYCODONE-ACETAMINOPHEN 5-325 MG PO TABS: 1.0000 | ORAL_TABLET | Freq: Four times a day (QID) | ORAL | 0 refills | Status: DC | PRN

## 2022-01-30 MED ORDER — ONDANSETRON HCL 4 MG/2ML IJ SOLN
INTRAMUSCULAR | Status: DC | PRN
  Administered 2022-01-30: 4 mg via INTRAVENOUS

## 2022-01-30 MED ORDER — BUPIVACAINE LIPOSOME 1.3 % IJ SUSP
INTRAMUSCULAR | Status: AC
  Filled 2022-01-30: qty 20

## 2022-01-30 MED ORDER — SODIUM CHLORIDE 0.9% FLUSH: 3.0000 mL | Freq: Two times a day (BID) | INTRAVENOUS | Status: DC

## 2022-01-30 MED ORDER — ACETAMINOPHEN 650 MG RE SUPP
650.0000 mg | RECTAL | Status: DC | PRN
  Filled 2022-01-30: qty 1

## 2022-01-30 MED ORDER — OXYCODONE HCL 5 MG/5ML PO SOLN: 5.0000 mg | Freq: Once | ORAL | Status: DC | PRN

## 2022-01-30 MED ORDER — PROPOFOL 10 MG/ML IV BOLUS
INTRAVENOUS | Status: AC
  Filled 2022-01-30: qty 20

## 2022-01-30 MED ORDER — PROPOFOL 10 MG/ML IV BOLUS
INTRAVENOUS | Status: DC | PRN
  Administered 2022-01-30: 250 mg via INTRAVENOUS

## 2022-01-30 MED ORDER — OXYCODONE HCL 5 MG PO TABS: 5.0000 mg | ORAL_TABLET | ORAL | Status: DC | PRN

## 2022-01-30 MED ORDER — 0.9 % SODIUM CHLORIDE (POUR BTL) OPTIME
TOPICAL | Status: DC | PRN
  Administered 2022-01-30: 1000 mL

## 2022-01-30 MED ORDER — BUPIVACAINE-EPINEPHRINE (PF) 0.25% -1:200000 IJ SOLN
INTRAMUSCULAR | Status: AC
  Filled 2022-01-30: qty 30

## 2022-01-30 MED ORDER — LIDOCAINE 2% (20 MG/ML) 5 ML SYRINGE
INTRAMUSCULAR | Status: DC | PRN
  Administered 2022-01-30: 40 mg via INTRAVENOUS

## 2022-01-30 MED ORDER — FENTANYL CITRATE PF 50 MCG/ML IJ SOSY: 25.0000 ug | PREFILLED_SYRINGE | INTRAMUSCULAR | Status: DC | PRN

## 2022-01-30 MED ORDER — ACETAMINOPHEN 325 MG PO TABS: 650.0000 mg | ORAL_TABLET | ORAL | Status: DC | PRN

## 2022-01-30 MED ORDER — MIDAZOLAM HCL 2 MG/2ML IJ SOLN
INTRAMUSCULAR | Status: DC | PRN
Start: 2022-01-30 — End: 2022-01-30
  Administered 2022-01-30 (×2): 2 mg via INTRAVENOUS

## 2022-01-30 MED ORDER — CHLORHEXIDINE GLUCONATE 0.12 % MT SOLN
15.0000 mL | Freq: Once | OROMUCOSAL | Status: AC
  Administered 2022-01-30: 15 mL via OROMUCOSAL

## 2022-01-30 MED ORDER — DEXAMETHASONE SODIUM PHOSPHATE 10 MG/ML IJ SOLN
INTRAMUSCULAR | Status: AC
  Filled 2022-01-30: qty 1

## 2022-01-30 MED ORDER — ROCURONIUM BROMIDE 10 MG/ML (PF) SYRINGE
PREFILLED_SYRINGE | INTRAVENOUS | Status: AC
  Filled 2022-01-30: qty 10

## 2022-01-30 MED ORDER — DEXAMETHASONE SODIUM PHOSPHATE 10 MG/ML IJ SOLN
INTRAMUSCULAR | Status: DC | PRN
  Administered 2022-01-30: 4 mg via INTRAVENOUS

## 2022-01-30 MED ORDER — FENTANYL CITRATE (PF) 250 MCG/5ML IJ SOLN
INTRAMUSCULAR | Status: DC | PRN
  Administered 2022-01-30 (×2): 50 ug via INTRAVENOUS

## 2022-01-30 MED ORDER — LACTATED RINGERS IV SOLN: INTRAVENOUS | Status: DC

## 2022-01-30 MED ORDER — DEXMEDETOMIDINE (PRECEDEX) IN NS 20 MCG/5ML (4 MCG/ML) IV SYRINGE
PREFILLED_SYRINGE | INTRAVENOUS | Status: AC
  Filled 2022-01-30: qty 10

## 2022-01-30 MED ORDER — SODIUM CHLORIDE 0.9 % IV SOLN: 250.0000 mL | INTRAVENOUS | Status: DC | PRN

## 2022-01-30 MED ORDER — ROCURONIUM BROMIDE 10 MG/ML (PF) SYRINGE
PREFILLED_SYRINGE | INTRAVENOUS | Status: DC | PRN
  Administered 2022-01-30: 60 mg via INTRAVENOUS

## 2022-01-30 MED ORDER — FENTANYL CITRATE (PF) 100 MCG/2ML IJ SOLN
INTRAMUSCULAR | Status: AC
  Filled 2022-01-30: qty 2

## 2022-01-30 MED ORDER — SODIUM CHLORIDE 0.9% FLUSH: 3.0000 mL | INTRAVENOUS | Status: DC | PRN

## 2022-01-30 MED ORDER — ORAL CARE MOUTH RINSE: 15.0000 mL | Freq: Once | OROMUCOSAL | Status: AC

## 2022-01-30 MED ORDER — BUPIVACAINE-EPINEPHRINE (PF) 0.5% -1:200000 IJ SOLN
INTRAMUSCULAR | Status: AC
  Filled 2022-01-30: qty 30

## 2022-01-30 MED ORDER — ACETAMINOPHEN 500 MG PO TABS
1000.0000 mg | ORAL_TABLET | ORAL | Status: AC
  Administered 2022-01-30: 1000 mg via ORAL
  Filled 2022-01-30: qty 2

## 2022-01-30 MED ORDER — SUGAMMADEX SODIUM 200 MG/2ML IV SOLN
INTRAVENOUS | Status: DC | PRN
  Administered 2022-01-30: 260 mg via INTRAVENOUS

## 2022-01-30 MED ORDER — DEXMEDETOMIDINE (PRECEDEX) IN NS 20 MCG/5ML (4 MCG/ML) IV SYRINGE
PREFILLED_SYRINGE | INTRAVENOUS | Status: DC | PRN
  Administered 2022-01-30: 8 ug via INTRAVENOUS
  Administered 2022-01-30 (×2): 4 ug via INTRAVENOUS
  Administered 2022-01-30 (×3): 8 ug via INTRAVENOUS

## 2022-01-30 MED ORDER — ONDANSETRON HCL 4 MG/2ML IJ SOLN: 4.0000 mg | Freq: Once | INTRAMUSCULAR | Status: DC | PRN

## 2022-01-30 SURGICAL SUPPLY — 45 items
BENZOIN TINCTURE PRP APPL 2/3 (GAUZE/BANDAGES/DRESSINGS) ×3 IMPLANT
BLADE EXTENDED COATED 6.5IN (ELECTRODE) IMPLANT
BLADE SURG SZ10 CARB STEEL (BLADE) ×2 IMPLANT
COVER BACK TABLE 60X90IN (DRAPES) ×2 IMPLANT
COVER MAYO STAND STRL (DRAPES) ×2 IMPLANT
DRAPE LAPAROTOMY T 98X78 PEDS (DRAPES) ×2 IMPLANT
DRAPE UTILITY XL STRL (DRAPES) ×2 IMPLANT
DRSG PAD ABDOMINAL 8X10 ST (GAUZE/BANDAGES/DRESSINGS) ×1 IMPLANT
ELECT REM PT RETURN 15FT ADLT (MISCELLANEOUS) ×2 IMPLANT
GAUZE 4X4 16PLY ~~LOC~~+RFID DBL (SPONGE) ×2 IMPLANT
GAUZE SPONGE 4X4 12PLY STRL (GAUZE/BANDAGES/DRESSINGS) ×2 IMPLANT
GLOVE SURG ENC MOIS LTX SZ6.5 (GLOVE) ×2 IMPLANT
GLOVE SURG UNDER LTX SZ6.5 (GLOVE) ×2 IMPLANT
GOWN STRL REUS W/TWL XL LVL3 (GOWN DISPOSABLE) ×2 IMPLANT
HYDROGEN PEROXIDE 16OZ (MISCELLANEOUS) ×1 IMPLANT
IV CATH 14GX2 1/4 (CATHETERS) ×2 IMPLANT
KIT BASIN OR (CUSTOM PROCEDURE TRAY) ×2 IMPLANT
KIT SIGMOIDOSCOPE (SET/KITS/TRAYS/PACK) IMPLANT
KIT TURNOVER KIT A (KITS) ×2 IMPLANT
LOOP VESSEL MAXI BLUE (MISCELLANEOUS) ×2 IMPLANT
NDL SAFETY ECLIPSE 18X1.5 (NEEDLE) ×1 IMPLANT
NEEDLE HYPO 18GX1.5 SHARP (NEEDLE)
NEEDLE HYPO 22GX1.5 SAFETY (NEEDLE) ×1 IMPLANT
PAD ARMBOARD 7.5X6 YLW CONV (MISCELLANEOUS) ×2 IMPLANT
PANTS MESH DISP LRG (UNDERPADS AND DIAPERS) ×1 IMPLANT
PANTS MESH DISPOSABLE L (UNDERPADS AND DIAPERS) ×1
PENCIL SMOKE EVACUATOR (MISCELLANEOUS) ×2 IMPLANT
SPIKE FLUID TRANSFER (MISCELLANEOUS) ×2 IMPLANT
SPONGE HEMORRHOID 8X3CM (HEMOSTASIS) IMPLANT
SPONGE SURGIFOAM ABS GEL 12-7 (HEMOSTASIS) IMPLANT
SUCTION FRAZIER HANDLE 10FR (MISCELLANEOUS)
SUCTION TUBE FRAZIER 10FR DISP (MISCELLANEOUS) IMPLANT
SUT CHROMIC 2 0 SH (SUTURE) IMPLANT
SUT CHROMIC 3 0 SH 27 (SUTURE) IMPLANT
SUT ETHIBOND 0 (SUTURE) ×2 IMPLANT
SUT VIC AB 2-0 SH 27 (SUTURE)
SUT VIC AB 2-0 SH 27XBRD (SUTURE) IMPLANT
SUT VIC AB 3-0 SH 18 (SUTURE) IMPLANT
SUT VIC AB 3-0 SH 27 (SUTURE)
SUT VIC AB 3-0 SH 27XBRD (SUTURE) IMPLANT
SYR CONTROL 10ML LL (SYRINGE) ×2 IMPLANT
TOWEL OR 17X26 10 PK STRL BLUE (TOWEL DISPOSABLE) ×2 IMPLANT
TUBING CONNECTING 10 (TUBING) ×1 IMPLANT
WATER STERILE IRR 500ML POUR (IV SOLUTION) ×2 IMPLANT
YANKAUER SUCT BULB TIP NO VENT (SUCTIONS) ×2 IMPLANT

## 2022-01-30 NOTE — Transfer of Care (Signed)
Immediate Anesthesia Transfer of Care Note ? ?Patient: Barbara Orozco ? ?Procedure(s) Performed: [place] ?ANAL EXAM UNDER ANESTHESIA ? ?Patient Location: PACU ? ?Anesthesia Type:General ? ?Level of Consciousness: awake, crying, does not indicate she is having any pain ? ?Airway & Oxygen Therapy: Patient Spontanous Breathing and Patient connected to face mask oxygen ? ?Post-op Assessment: Report given to RN and Post -op Vital signs reviewed and stable ? ?Post vital signs: Reviewed and stable ? ?Last Vitals:  ?Vitals Value Taken Time  ?BP 115/75 01/30/22 1036  ?Temp    ?Pulse 84 01/30/22 1039  ?Resp 18 01/30/22 1039  ?SpO2 100 % 01/30/22 1039  ?Vitals shown include unvalidated device data. ? ?Last Pain:  ?Vitals:  ? 01/30/22 0842  ?TempSrc: Oral  ?PainSc:   ?   ? ?  ? ?Complications: No notable events documented. ?

## 2022-01-30 NOTE — Op Note (Addendum)
01/30/2022 ? ?10:16 AM ? ?PATIENT:  Barbara Orozco  36 y.o. female ? ?Patient Care Team: ?Pcp, No as PCP - General ?Cameron Sprang, MD as Consulting Physician (Neurology) ? ?PRE-OPERATIVE DIAGNOSIS:  ANAL FISTULA ? ?POST-OPERATIVE DIAGNOSIS:  ANAL FISTULA ? ?PROCEDURE:   ?PLACEMENT OF SETON ?ANAL EXAM UNDER ANESTHESIA ? ?  Surgeon(s): ?Leighton Ruff, MD ? ?ASSISTANT: none  ? ?ANESTHESIA:   local and general ? ?SPECIMEN:  No Specimen ? ?DISPOSITION OF SPECIMEN:  N/A ? ?COUNTS:  YES ? ?PLAN OF CARE: Discharge to home after PACU ? ?PATIENT DISPOSITION:  PACU - hemodynamically stable. ? ?INDICATION: 36 y.o. F with chronic diarrhea and a posterior midline fistula.  She already has some trouble with urgency and therefore we decided to place a seton.  She is currently undergoing work-up for her chronic diarrhea to rule out inflammatory bowel disease or other infectious causes. ? ? ?OR FINDINGS: Posterior midline fistula with branching distally. ? ?DESCRIPTION: the patient was identified in the preoperative holding area and taken to the OR where they were laid on the operating room table.  General anesthesia was induced without difficulty. The patient was then positioned in prone jackknife position with buttocks gently taped apart.  The patient was then prepped and draped in usual sterile fashion.  SCDs were noted to be in place prior to the initiation of anesthesia. A surgical timeout was performed indicating the correct patient, procedure, positioning and need for preoperative antibiotics.  A rectal block was performed using Marcaine with epinephrine.   ? ?I began with a digital rectal exam.  Scar was noted at posterior midline.  I then placed a Hill-Ferguson anoscope into the anal canal and evaluated this completely.  Internally there were no signs of pathology.  I used an S shaped fistula probe to insert into both fistula tracts at posterior midline.  They both traversed towards a posterior midline internal opening.  I  placed a probe through the more proximal tract and brought an Ethibond suture through this.  I then brought a vessel loop back through to create a seton.  The vessel loop was tied with Ethibond suture x3.  Additional Marcaine was placed around the incision site.  Patient was then awakened from anesthesia and sent to the postanesthesia care unit in stable condition.  All counts were correct per operating room staff. ? ?Rosario Adie, MD ? ?Colorectal and General Surgery ?[facility]  ? ?

## 2022-01-30 NOTE — Discharge Instructions (Addendum)

## 2022-01-30 NOTE — Anesthesia Preprocedure Evaluation (Addendum)
Anesthesia Evaluation  ?Patient identified by MRN, date of birth, ID band ?Patient awake ? ? ? ?Reviewed: ?Allergy & Precautions, NPO status , Patient's Chart, lab work & pertinent test results ? ?History of Anesthesia Complications ?(+) Emergence Delirium and history of anesthetic complications (patient states she wakes up very aggressive) ? ?Airway ?Mallampati: II ? ?TM Distance: >3 FB ?Neck ROM: Full ? ? ? Dental ?no notable dental hx. ?(+) Teeth Intact, Dental Advisory Given ?  ?Pulmonary ?asthma , former smoker,  ?  ?Pulmonary exam normal ?breath sounds clear to auscultation ? ? ? ? ? ? Cardiovascular ?negative cardio ROS ?Normal cardiovascular exam ?Rhythm:Regular Rate:Normal ? ? ?  ?Neuro/Psych ?Seizures - (non-epileptic seizures per neurology, likely psychogenic), Well Controlled,  PSYCHIATRIC DISORDERS Depression Last Sz 12/28/21 ?  ? GI/Hepatic ?Neg liver ROS, PUD, GERD  Medicated,IBS ?Hx/o UC  ?Hx/o Crohn's disease ? ?Anal fistula ?  ?Endo/Other  ?Morbid obesityPCOS ? Renal/GU ?negative Renal ROS  ?negative genitourinary ?  ?Musculoskeletal ?negative musculoskeletal ROS ?(+)  ? Abdominal ?(+) + obese,   ?Peds ?negative pediatric ROS ?(+)  Hematology ?negative hematology ROS ?(+)   ?Anesthesia Other Findings ? ? Reproductive/Obstetrics ?negative OB ROS ? ?  ? ? ? ? ? ? ? ? ? ? ? ? ? ?  ?  ? ? ? ? ? ? ? ? ?Anesthesia Physical ? ?Anesthesia Plan ? ?ASA: 3 ? ?Anesthesia Plan: General  ? ?Post-op Pain Management: Tylenol PO (pre-op), Precedex and Minimal or no pain anticipated  ? ?Induction: Intravenous and Cricoid pressure planned ? ?PONV Risk Score and Plan: 3 and Treatment may vary due to age or medical condition, Scopolamine patch - Pre-op, Midazolam, Ondansetron and Dexamethasone ? ?Airway Management Planned: Oral ETT ? ?Additional Equipment: None ? ?Intra-op Plan:  ? ?Post-operative Plan: Extubation in OR ? ?Informed Consent: I have reviewed the patients History and  Physical, chart, labs and discussed the procedure including the risks, benefits and alternatives for the proposed anesthesia with the patient or authorized representative who has indicated his/her understanding and acceptance.  ? ? ? ?Dental advisory given ? ?Plan Discussed with: Anesthesiologist and CRNA ? ?Anesthesia Plan Comments: (  ?)  ? ? ? ? ? ?Anesthesia Quick Evaluation ? ?

## 2022-01-30 NOTE — Interval H&P Note (Signed)
History and Physical Interval Note: ? ?01/30/2022 ?9:33 AM ? ?Barbara Orozco  has presented today for surgery, with the diagnosis of ANAL FISTULA.  The various methods of treatment have been discussed with the patient and family. After consideration of risks, benefits and other options for treatment, the patient has consented to  Procedure(s): ?PLACEMENT OF SETON (N/A) ?ANAL EXAM UNDER ANESTHESIA (N/A) as a surgical intervention.  The patient's history has been reviewed, patient examined, no change in status, stable for surgery.  I have reviewed the patient's chart and labs.  Questions were answered to the patient's satisfaction.   ? ? ?Rosario Adie, MD ? ?Colorectal and General Surgery ?[facility]  ? ? ?

## 2022-01-30 NOTE — Anesthesia Postprocedure Evaluation (Signed)
Anesthesia Post Note ? ?Patient: Barbara Orozco ? ?Procedure(s) Performed: [place] ?ANAL EXAM UNDER ANESTHESIA ? ?  ? ?Patient location during evaluation: PACU ?Anesthesia Type: General ?Level of consciousness: awake and alert and oriented ?Pain management: pain level controlled ?Vital Signs Assessment: post-procedure vital signs reviewed and stable ?Respiratory status: spontaneous breathing, nonlabored ventilation and respiratory function stable ?Cardiovascular status: blood pressure returned to baseline and stable ?Postop Assessment: no apparent nausea or vomiting ?Anesthetic complications: no ? ? ?No notable events documented. ? ?Last Vitals:  ?Vitals:  ? 01/30/22 1045 01/30/22 1100  ?BP: 134/62 133/75  ?Pulse: 73 63  ?Resp: 15 19  ?Temp:  (!) 36.4 ?C  ?SpO2: 100% 100%  ?  ?Last Pain:  ?Vitals:  ? 01/30/22 1100  ?TempSrc:   ?PainSc: 0-No pain  ? ? ?  ?  ?  ?  ?  ?  ? ?Georgetta Crafton A. ? ? ? ? ?

## 2022-01-30 NOTE — Anesthesia Procedure Notes (Signed)
Procedure Name: Intubation ?Date/Time: 01/30/2022 9:54 AM ?Performed by: Eben Burow, CRNA ?Pre-anesthesia Checklist: Patient identified, Emergency Drugs available, Suction available, Patient being monitored and Timeout performed ?Patient Re-evaluated:Patient Re-evaluated prior to induction ?Oxygen Delivery Method: Circle system utilized ?Preoxygenation: Pre-oxygenation with 100% oxygen ?Induction Type: IV induction ?Ventilation: Mask ventilation without difficulty ?Laryngoscope Size: Mac and 4 ?Grade View: Grade I ?Tube type: Oral ?Number of attempts: 1 ?Airway Equipment and Method: Stylet ?Placement Confirmation: ETT inserted through vocal cords under direct vision, positive ETCO2 and breath sounds checked- equal and bilateral ?Secured at: 22 cm ?Tube secured with: Tape ?Dental Injury: Teeth and Oropharynx as per pre-operative assessment  ? ? ? ? ?

## 2022-01-31 ENCOUNTER — Encounter (HOSPITAL_COMMUNITY): Payer: Self-pay | Admitting: General Surgery

## 2022-02-01 ENCOUNTER — Other Ambulatory Visit: Payer: Self-pay | Admitting: Internal Medicine

## 2022-02-01 DIAGNOSIS — Z8711 Personal history of peptic ulcer disease: Secondary | ICD-10-CM

## 2022-02-07 ENCOUNTER — Ambulatory Visit: Payer: 59 | Admitting: Internal Medicine

## 2022-03-07 ENCOUNTER — Ambulatory Visit (INDEPENDENT_AMBULATORY_CARE_PROVIDER_SITE_OTHER): Payer: 59 | Admitting: Internal Medicine

## 2022-03-07 ENCOUNTER — Encounter: Payer: Self-pay | Admitting: Internal Medicine

## 2022-03-07 ENCOUNTER — Other Ambulatory Visit (INDEPENDENT_AMBULATORY_CARE_PROVIDER_SITE_OTHER): Payer: 59

## 2022-03-07 VITALS — BP 128/70 | HR 88 | Ht 67.0 in | Wt 278.0 lb

## 2022-03-07 DIAGNOSIS — K529 Noninfective gastroenteritis and colitis, unspecified: Secondary | ICD-10-CM

## 2022-03-07 DIAGNOSIS — K603 Anal fistula: Secondary | ICD-10-CM | POA: Diagnosis not present

## 2022-03-07 LAB — C-REACTIVE PROTEIN: CRP: 1.5 mg/dL (ref 0.5–20.0)

## 2022-03-07 LAB — SEDIMENTATION RATE: Sed Rate: 34 mm/hr — ABNORMAL HIGH (ref 0–20)

## 2022-03-07 NOTE — Patient Instructions (Signed)
Your provider has requested that you go to the basement level for lab work before leaving today. Press "B" on the elevator. The lab is located at the first door on the left as you exit the elevator. ? ?Due to recent changes in healthcare laws, you may see the results of your imaging and laboratory studies on MyChart before your provider has had a chance to review them.  We understand that in some cases there may be results that are confusing or concerning to you. Not all laboratory results come back in the same time frame and the provider may be waiting for multiple results in order to interpret others.  Please give [place] 48 hours in order for your provider to thoroughly review all the results before contacting the office for clarification of your results.  ? ?You have been scheduled for a colonoscopy. Please follow written instructions given to you at your visit today.  ?Please pick up your prep supplies at the pharmacy within the next 1-3 days. ?If you use inhalers (even only as needed), please bring them with you on the day of your procedure. ? ?I appreciate the opportunity to care for you. ?Silvano Rusk, MD, [facility] ?

## 2022-03-07 NOTE — Progress Notes (Signed)
? ?Barbara Orozco 36 y.o. Sep 09, 1986 [id number] ? ?Assessment & Plan:  ? ?Encounter Diagnoses  ?Name Primary?  ? 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 Anal fistula   ? ? ?Laboratory work-up as below to try to sort this out.  I do agree would be important to understand if she does have inflammatory bowel disease.  The fact that she had been on steroids at the time of the last work-up could have been a confounder.  In addition to the labs a colonoscopy will be undertaken in the [facility].The risks and benefits as well as alternatives of endoscopic procedure(s) have been discussed and reviewed. All questions answered. The patient agrees to proceed. ?She knows to contact us if she has a seizure in the interim as she needs to be seizure-free for 3 months prior to having her procedures in our ambulatory endoscopy unit. ? ? ?Orders Placed This Encounter  ?Procedures  ? Calprotectin, Fecal  ? Sedimentation rate  ? C-reactive protein  ? Ambulatory referral to Gastroenterology  ? ? ?CC: Leighton Ruff, MD ? ? ? ?Subjective:  ? ?Chief Complaint: Diarrhea, question IBD ? ?HPI ?The patient is a 36 year old woman with a history of ulcerative colitis who was evaluated by me with EGD and colonoscopy in April 2021 due to ongoing symptoms.  At that time she was on steroids.  She is also had recurrent perianal abscesses and has a seton in place right now.  01/30/2022 had a posterior midline fistula branching distally, seton was placed at that time.  Dr. Marcello Moores of colorectal surgery has requested she return to me to consider additional evaluation as there is still some suspicion of inflammatory bowel disease and that knowledge would be helpful prior to any surgery.  The patient continues to have diarrhea at times, she had tried Lomotil in the past and that may have caused some constipation, Imodium does help at times as well.  She continues to have intermittent abdominal cramping as well.  The seton is bothersome and she would like that  removed.  She denies any rectal bleeding.  There is some fatigue.  Previously she had to have her procedures performed at the hospital due to recent seizure activity, she has not had any seizures in the last 6 months. ? ?She had an ectopic pregnancy treated with laparoscopic right salpingectomy with removal of ectopic pregnancy 12/11/2021 by Dr. Valentino Saxon.  Pregnancy test was - March 28.  No menses yet. ?Allergies  ?Allergen Reactions  ? Bee Venom Anaphylaxis  ? Pork Allergy Anaphylaxis  ? Tramadol Rash and Other (See Comments)  ?  Pt has Crohn's  Disease - Tramadol makes her stomach hurt/cramps  ? Nsaids Other (See Comments)  ?  Avoid due to Crohn's disease  ? Lithium Rash  ? ?Current Meds  ?Medication Sig  ? acetaminophen (TYLENOL) 500 MG tablet Take 1,000 mg by mouth daily as needed for headache (pain).  ? albuterol (VENTOLIN HFA) 108 (90 Base) MCG/ACT inhaler Inhale 2 puffs into the lungs every 4 (four) hours as needed for shortness of breath. (Patient taking differently: Inhale 2 puffs into the lungs every 4 (four) hours as needed for shortness of breath (seasonal allergies/summer).)  ? dicyclomine (BENTYL) 10 MG capsule Take 1 capsule (10 mg total) by mouth 4 (four) times daily -  before meals and at bedtime. (Patient taking differently: Take 10 mg by mouth at bedtime as needed (stomach cramps).)  ? lidocaine (XYLOCAINE) 2 % jelly Apply perianally and just inside the  anus TID prn  for anal pain  ? loperamide (IMODIUM) 2 MG capsule Take 2 mg by mouth daily as needed (diarrhea/loose stools).  ? MELATONIN PO Take 1 tablet by mouth at bedtime as needed (sleep). gummie  ? Multiple Vitamin (MULTIVITAMIN WITH MINERALS) TABS tablet Take 1 tablet by mouth daily.  ? norethindrone (AYGESTIN) 5 MG tablet Take 10 mg by mouth every morning.  ? ondansetron (ZOFRAN-ODT) 4 MG disintegrating tablet DISSOLVE 1 TABLET IN MOUTH EVERY 8 HOURS AS NEEDED FOR NAUSEA OR VOMITING  ? pantoprazole (PROTONIX) 40 MG tablet TAKE 1 TABLET BY  MOUTH TWICE DAILY BEFORE MEAL(S)  ? ?Past Medical History:  ?Diagnosis Date  ? Allergy   ? Anal fistula   ? Arm fracture   ? right and left  ? Asthma   ? Complication of anesthesia   ? "wakes up angry for about 5 minutes"  ? Depression   ? Endometriosis   ? Endometriosis   ? Foot pain   ? right foot hurts since last night no reported injury per pt on 01-25-2022  ? GERD (gastroesophageal reflux disease)   ? History of 2019 novel coronavirus disease (COVID-19) 04/29/2020  ? + Ag test - can see in Care everywhere  ? History of chicken pox   ? had 4 or 5 times as child  ? History of COVID-19 04/29/2020  ? mild all symptoms resolved  ? Irritable bowel   ? Leg fracture, left   ? Psychogenic nonepileptic seizure   ? last seizure 1 month ago per pt on 01-25-2022 saw dr Leta Jungling neurology  ? Seizure ([place])   ? last seizure 12-28-2021 per pt on 01-25-2022  ? Shingles   ? intermittent outbreaks  ? Ulcerative colitis ([place])   ? Wears glasses   ? ?Past Surgical History:  ?Procedure Laterality Date  ? APPENDECTOMY    ? BIOPSY  06/06/2020  ? Procedure: BIOPSY;  Surgeon: Gatha Mayer, MD;  Location: [facility];  Service: Endoscopy;;  ? COLONOSCOPY WITH PROPOFOL N/A 06/06/2020  ? Procedure: COLONOSCOPY WITH PROPOFOL;  Surgeon: Gatha Mayer, MD;  Location: [facility];  Service: Endoscopy;  Laterality: N/A;  ? DIAGNOSTIC LAPAROSCOPY WITH REMOVAL OF ECTOPIC PREGNANCY Right 12/11/2021  ? Procedure: LAPAROSCOPIC RIGHT SALPINGECTOMY WITH REMOVAL OF ECTOPIC PREGNANCY, lysis of adhesions;  Surgeon: Aloha Gell, MD;  Location: [place];  Service: Gynecology;  Laterality: Right;  ? ESOPHAGOGASTRODUODENOSCOPY (EGD) WITH PROPOFOL N/A 06/06/2020  ? Procedure: ESOPHAGOGASTRODUODENOSCOPY (EGD) WITH PROPOFOL;  Surgeon: Gatha Mayer, MD;  Location: [facility];  Service: Endoscopy;  Laterality: N/A;  ? HAND SURGERY Left   ? x3  ? PLACEMENT OF SETON N/A 01/30/2022  ? Procedure: PLACEMENT OF SETON;  Surgeon: Leighton Ruff, MD;  Location: [facility];   Service: General;  Laterality: N/A;  ? RECTAL EXAM UNDER ANESTHESIA N/A 01/30/2022  ? Procedure: ANAL EXAM UNDER ANESTHESIA;  Surgeon: Leighton Ruff, MD;  Location: [facility];  Service: General;  Laterality: N/A;  ? ?Social History  ? ?Social History Narrative  ? Engaged  ? occ EtOH, + Marijuana, no tobacco now - former  ? Right Handed  ? Drinks Caffeine   ? One Story Home   ? ?family history includes Colitis in her father, mother, and paternal grandmother; Diabetes in her father; Heart disease in her mother; Other in her mother; Stomach cancer in her paternal aunt; Ulcerative colitis in her father, mother, and paternal grandmother. ? ? ?Review of Systems ?As above ? ?Objective:  ?  Physical Exam ?BP 128/70 (Patient Position: Standing)   Pulse 88   Ht 5' 7"  (1.702 m)   Wt 278 lb (126.1 kg)   LMP 10/26/2021 Comment: R ectopic s/p salpingectomy 12-11-21  BMI 43.54 kg/m?  ?Well-developed obese white woman in no acute distress ?Lungs clear ?Heart normal S1-S2 no rubs or gallops ?Abdomen is obese soft mildly tender in the lower quadrants ?Rectal is deferred until colonoscopy ?

## 2022-03-08 ENCOUNTER — Encounter: Payer: Self-pay | Admitting: Internal Medicine

## 2022-03-12 LAB — CALPROTECTIN, FECAL: Calprotectin, Fecal: 18 ug/g (ref 0–120)

## 2022-03-15 ENCOUNTER — Other Ambulatory Visit: Payer: Self-pay | Admitting: Internal Medicine

## 2022-04-01 ENCOUNTER — Emergency Department (HOSPITAL_COMMUNITY): Payer: 59

## 2022-04-01 ENCOUNTER — Emergency Department (HOSPITAL_COMMUNITY)
Admission: EM | Admit: 2022-04-01 | Discharge: 2022-04-01 | Disposition: A | Discharge status: Home / Self Care | Payer: 59 | Attending: Emergency Medicine | Admitting: Emergency Medicine

## 2022-04-01 ENCOUNTER — Other Ambulatory Visit: Payer: Self-pay

## 2022-04-01 ENCOUNTER — Encounter (HOSPITAL_COMMUNITY): Payer: Self-pay | Admitting: Emergency Medicine

## 2022-04-01 DIAGNOSIS — N9489 Other specified conditions associated with female genital organs and menstrual cycle: Secondary | ICD-10-CM | POA: Diagnosis not present

## 2022-04-01 DIAGNOSIS — N858 Other specified noninflammatory disorders of uterus: Secondary | ICD-10-CM | POA: Diagnosis not present

## 2022-04-01 DIAGNOSIS — R102 Pelvic and perineal pain: Secondary | ICD-10-CM | POA: Insufficient documentation

## 2022-04-01 DIAGNOSIS — J45909 Unspecified asthma, uncomplicated: Secondary | ICD-10-CM | POA: Insufficient documentation

## 2022-04-01 DIAGNOSIS — N939 Abnormal uterine and vaginal bleeding, unspecified: Secondary | ICD-10-CM | POA: Insufficient documentation

## 2022-04-01 LAB — CBC WITH DIFFERENTIAL/PLATELET
Abs Immature Granulocytes: 0.02 10*3/uL (ref 0.00–0.07)
Basophils Absolute: 0.1 10*3/uL (ref 0.0–0.1)
Basophils Relative: 1 %
Eosinophils Absolute: 0.3 10*3/uL (ref 0.0–0.5)
Eosinophils Relative: 3 %
HCT: 43.5 % (ref 36.0–46.0)
Hemoglobin: 14 g/dL (ref 12.0–15.0)
Immature Granulocytes: 0 %
Lymphocytes Relative: 33 %
Lymphs Abs: 3 10*3/uL (ref 0.7–4.0)
MCH: 28.2 pg (ref 26.0–34.0)
MCHC: 32.2 g/dL (ref 30.0–36.0)
MCV: 87.5 fL (ref 80.0–100.0)
Monocytes Absolute: 0.5 10*3/uL (ref 0.1–1.0)
Monocytes Relative: 5 %
Neutro Abs: 5.2 10*3/uL (ref 1.7–7.7)
Neutrophils Relative %: 58 %
Platelets: 408 10*3/uL — ABNORMAL HIGH (ref 150–400)
RBC: 4.97 MIL/uL (ref 3.87–5.11)
RDW: 13.8 % (ref 11.5–15.5)
WBC: 9 10*3/uL (ref 4.0–10.5)
nRBC: 0 % (ref 0.0–0.2)

## 2022-04-01 LAB — WET PREP, GENITAL
Clue Cells Wet Prep HPF POC: NONE SEEN
Sperm: NONE SEEN
Trich, Wet Prep: NONE SEEN
WBC, Wet Prep HPF POC: <10 (ref <10)
Yeast Wet Prep HPF POC: NONE SEEN

## 2022-04-01 LAB — BASIC METABOLIC PANEL
Anion gap: 6 (ref 5–15)
BUN: 6 mg/dL (ref 6–20)
CO2: 22 mmol/L (ref 22–32)
Calcium: 9.1 mg/dL (ref 8.9–10.3)
Chloride: 110 mmol/L (ref 98–111)
Creatinine, Ser: 0.77 mg/dL (ref 0.44–1.00)
GFR, Estimated: >60 mL/min (ref >60)
Glucose, Bld: 114 mg/dL — ABNORMAL HIGH (ref 70–99)
Potassium: 4.1 mmol/L (ref 3.5–5.1)
Sodium: 138 mmol/L (ref 135–145)

## 2022-04-01 LAB — PREGNANCY, URINE: Preg Test, Ur: NEGATIVE

## 2022-04-01 LAB — URINALYSIS, ROUTINE W REFLEX MICROSCOPIC
Bacteria, UA: NONE SEEN
Bilirubin Urine: NEGATIVE
Glucose, UA: NEGATIVE mg/dL
Ketones, ur: NEGATIVE mg/dL
Leukocytes,Ua: NEGATIVE
Nitrite: NEGATIVE
Protein, ur: NEGATIVE mg/dL
Specific Gravity, Urine: 1.02 (ref 1.005–1.030)
pH: 5 (ref 5.0–8.0)

## 2022-04-01 LAB — I-STAT BETA HCG BLOOD, ED (MC, WL, AP ONLY): I-stat hCG, quantitative: <5 m[IU]/mL (ref <5)

## 2022-04-01 MED ORDER — MORPHINE SULFATE (PF) 4 MG/ML IV SOLN
4.0000 mg | Freq: Once | INTRAVENOUS | Status: AC
  Administered 2022-04-01: 4 mg via INTRAVENOUS
  Filled 2022-04-01: qty 1

## 2022-04-01 MED ORDER — ONDANSETRON HCL 4 MG/2ML IJ SOLN
4.0000 mg | Freq: Once | INTRAMUSCULAR | Status: AC
  Administered 2022-04-01: 4 mg via INTRAVENOUS
  Filled 2022-04-01: qty 2

## 2022-04-01 MED ORDER — HYDROCODONE-ACETAMINOPHEN 5-325 MG PO TABS: 1.0000 | ORAL_TABLET | Freq: Four times a day (QID) | ORAL | 0 refills | Status: DC | PRN

## 2022-04-01 MED ORDER — DIPHENHYDRAMINE HCL 50 MG/ML IJ SOLN
25.0000 mg | Freq: Once | INTRAMUSCULAR | Status: AC
  Administered 2022-04-01: 25 mg via INTRAVENOUS
  Filled 2022-04-01: qty 1

## 2022-04-01 NOTE — ED Triage Notes (Signed)
Pt arrives c/o vaginal bleeding x2 weeks straight. Pt states bleeding occurred after intercourse. Hx ectopic pregnancy in February, R fallopian tube removed, endometriosis discovered at this time with extensive scar tissue. Pt taking Aygestin, not supposed to get period during interim. Pt endorses moderate, steady flow, passing clots today with LLQ pain that radiates down leg and hurts to bend leg.

## 2022-04-01 NOTE — ED Notes (Signed)
Patient transported to Ultrasound 

## 2022-04-01 NOTE — ED Provider Notes (Signed)
[facility] EMERGENCY DEPARTMENT Provider Note   CSN: [id number] Arrival date & time: 04/01/22  1127     History  Chief Complaint  Patient presents with   Vaginal Bleeding    Barbara Orozco is a 36 y.o. female.  Malin Peyser is a 36 y.o. female history ectopic pregnancy asthma, GERD, patient is currently being worked up for possible ulcerative colitis given history of multiple perirectal abscesses, who presents to the emergency department today for evaluation of vaginal bleeding and lower abdominal pain.  Patient reports that she has been having vaginal bleeding for about 2 weeks now.  Bleeding initially started after intercourse, but was on schedule for regular menstrual cycle after her cycle last month.  Multiple symptoms she reports that today the bleeding became heavier and she was passing several clots and started to have worsening pain in her lower abdomen in particular of the left side so she came in for evaluation.  She is followed by Dr. Vivia Budge OB/GYN.  When she had the ectopic pregnancy in February and they remove the right fallopian to she was found to have endometriosis and extensive scar tissue at that time.  She is placed on Aygestin to help control heavier bleeding and has had this dose increased in response to his bleeding over the past 2 weeks but has not seen much improvement.  She has not been able to follow-up with her OB/GYN yet and pain worsening she presented.  No nausea or vomiting.  Patient reports that she is currently being worked up by GI for possible ulcerative colitis, cannot take NSAIDs for pain due to this, but has been taking Tylenol without much relief.  No lightheadedness or syncope.  No other aggravating or alleviating factors.  The history is provided by the patient.      Home Medications Prior to Admission medications   Medication Sig Start Date End Date Taking? Authorizing Provider  acetaminophen (TYLENOL) 500 MG tablet Take  1,000 mg by mouth daily as needed for headache (pain).    [provider]  albuterol (VENTOLIN HFA) 108 (90 Base) MCG/ACT inhaler Inhale 2 puffs into the lungs every 4 (four) hours as needed for shortness of breath. Patient taking differently: Inhale 2 puffs into the lungs every 4 (four) hours as needed for shortness of breath (seasonal allergies/summer). 06/26/21   Denita Lung, MD  dicyclomine (BENTYL) 10 MG capsule Take 1 capsule (10 mg total) by mouth 4 (four) times daily -  before meals and at bedtime. Patient taking differently: Take 10 mg by mouth at bedtime as needed (stomach cramps). 12/06/21   Gatha Mayer, MD  HYDROcodone-acetaminophen (NORCO) 5-325 MG tablet Take 1 tablet by mouth every 6 (six) hours as needed. 04/01/22   Jacqlyn Larsen, PA-C  lidocaine (XYLOCAINE) 2 % jelly Apply perianally and just inside the anus TID prn  for anal pain 07/12/21   Willia Craze, NP  loperamide (IMODIUM) 2 MG capsule Take 2 mg by mouth daily as needed (diarrhea/loose stools).    [provider]  MELATONIN PO Take 1 tablet by mouth at bedtime as needed (sleep). gummie    [provider]  Multiple Vitamin (MULTIVITAMIN WITH MINERALS) TABS tablet Take 1 tablet by mouth daily.    [provider]  norethindrone (AYGESTIN) 5 MG tablet Take 10 mg by mouth every morning.    [provider]  ondansetron (ZOFRAN-ODT) 4 MG disintegrating tablet DISSOLVE 1 TABLET IN MOUTH EVERY 8 HOURS AS  NEEDED FOR NAUSEA FOR VOMITING 03/15/22   Gatha Mayer, MD  pantoprazole (PROTONIX) 40 MG tablet TAKE 1 TABLET BY MOUTH TWICE DAILY BEFORE MEAL(S) 02/01/22   Gatha Mayer, MD      Allergies    Bee venom, Pork allergy, Tramadol, Nsaids, and Lithium    Review of Systems   Review of Systems  Constitutional:  Negative for chills and fever.  HENT: Negative.    Respiratory:  Negative for cough and shortness of breath.   Cardiovascular:  Negative for chest pain and leg swelling.   Gastrointestinal:  Positive for abdominal pain. Negative for nausea and vomiting.  Genitourinary:  Positive for pelvic pain and vaginal bleeding. Negative for dysuria, frequency and vaginal discharge.  Musculoskeletal:  Negative for arthralgias and myalgias.  Neurological:  Negative for syncope.  All other systems reviewed and are negative.  Physical Exam Updated Vital Signs BP 114/75 (BP Location: Right Arm)   Pulse 74   Temp 98.2 F (36.8 C) (Oral)   Resp 18   Ht 5' 7"  (1.702 m)   Wt 124.7 kg   LMP 10/26/2021 Comment: R ectopic s/p salpingectomy 12-11-21  SpO2 100%   BMI 43.07 kg/m  Physical Exam Vitals and nursing note reviewed.  Constitutional:      General: She is not in acute distress.    Appearance: Normal appearance. She is well-developed. She is not ill-appearing or diaphoretic.  HENT:     Head: Normocephalic and atraumatic.  Eyes:     General:        Right eye: No discharge.        Left eye: No discharge.  Cardiovascular:     Rate and Rhythm: Normal rate and regular rhythm.     Pulses: Normal pulses.     Heart sounds: Normal heart sounds.  Pulmonary:     Effort: Pulmonary effort is normal. No respiratory distress.     Breath sounds: Normal breath sounds. No wheezing or rales.     Comments: Respirations equal and unlabored, patient able to speak in full sentences, lungs clear to auscultation bilaterally  Abdominal:     General: Bowel sounds are normal. There is no distension.     Palpations: Abdomen is soft. There is no mass.     Tenderness: There is abdominal tenderness. There is no guarding.     Comments: Abdomen soft, nondistended, bowel sounds present, there is tenderness across, most notably in the left pelvic region, no guarding or peritoneal signs  Genitourinary:    Comments: Chaperone present during speculum exam. No external genital lesions noted. On speculum exam there were a few blood clots present but no active or brisk bleeding, cervix appears  normal. On bimanual exam there is no cervical motion tenderness, there is some left adnexal tenderness without palpable masses. Musculoskeletal:        General: No deformity.     Cervical back: Neck supple.  Skin:    General: Skin is warm and dry.     Capillary Refill: Capillary refill takes less than 2 seconds.  Neurological:     Mental Status: She is alert and oriented to person, place, and time.     Coordination: Coordination normal.     Comments: Speech is clear, able to follow commands Moves extremities without ataxia, coordination intact  Psychiatric:        Mood and Affect: Mood normal.        Behavior: Behavior normal.    ED Results / Procedures / Treatments  Labs (all labs ordered are listed, but only abnormal results are displayed) Labs Reviewed  BASIC METABOLIC PANEL - Abnormal; Notable for the following components:      Result Value   Glucose, Bld 114 (*)    All other components within normal limits  CBC WITH DIFFERENTIAL/PLATELET - Abnormal; Notable for the following components:   Platelets 408 (*)    All other components within normal limits  URINALYSIS, ROUTINE W REFLEX MICROSCOPIC - Abnormal; Notable for the following components:   Hgb urine dipstick LARGE (*)    All other components within normal limits  WET PREP, GENITAL  PREGNANCY, URINE  I-STAT BETA HCG BLOOD, ED ([facility], [facility], [facility] ONLY)  GC/CHLAMYDIA PROBE AMP ([facility]) NOT AT [facility]    EKG None  Radiology US PELVIC COMPLETE W TRANSVAGINAL AND TORSION R/O  Result Date: 04/01/2022 CLINICAL DATA:  Pelvic pain. Previous right salpingectomy. Menstrual bleeding for the past 2 weeks. EXAM: TRANSABDOMINAL AND TRANSVAGINAL ULTRASOUND OF PELVIS TECHNIQUE: Both transabdominal and transvaginal ultrasound examinations of the pelvis were performed. Transabdominal technique was performed for global imaging of the pelvis including uterus, ovaries, adnexal regions, and pelvic cul-de-sac. It was necessary to proceed with  endovaginal exam following the transabdominal exam to visualize the endometrium and better visualize the uterus and ovaries. COMPARISON:  Obstetrical ultrasound dated 04/25/2020. FINDINGS: Uterus Measurements: 8.8 x 4.8 x 3.4 cm = volume: 76 mL. Small fundal subendometrial cyst measuring 3 mm in maximum diameter. Endometrium Thickness: 1 mm.  No focal abnormality visualized. Right ovary Measurements: 2.6 x 1.7 x 1.6 cm = volume: 3.8 mL. Normal appearance/no adnexal mass. Left ovary Measurements: 2.2 x 1.7 x 1.4 cm = volume: 2.7 mL. Normal appearance/no adnexal mass. Other findings No abnormal free fluid. IMPRESSION: 3 mm subendometrial cyst.  Otherwise, normal examination. Electronically Signed   By: Claudie Revering M.D.   On: 04/01/2022 14:29    Procedures Procedures    Medications Ordered in ED Medications  ondansetron (ZOFRAN) injection 4 mg (4 mg Intravenous Given 04/01/22 1241)  morphine (PF) 4 MG/ML injection 4 mg (4 mg Intravenous Given 04/01/22 1246)  diphenhydrAMINE (BENADRYL) injection 25 mg (25 mg Intravenous Given 04/01/22 1259)  morphine (PF) 4 MG/ML injection 4 mg (4 mg Intravenous Given 04/01/22 1555)    ED Course/ Medical Decision Making/ A&P                           Medical Decision Making Amount and/or Complexity of Data Reviewed Labs: ordered. Radiology: ordered.  Risk Prescription drug management.   36 y.o. female presents to the ED with complaints of vaginal bleeding and lower abdominal and pelvic pain, this involves an extensive number of treatment options, and is a complaint that carries with it a high risk of complications and morbidity.  The differential diagnosis includes endometriosis, ovarian cyst, ovarian torsion, uterine fibroid, ectopic pregnancy, PID, diverticulitis, colitis  On arrival pt is nontoxic, vitals WNL. Exam significant for tenderness across the lower abdomen and pelvic region particular on the left but without guarding or peritoneal  signs  Additional history obtained from chart review. Previous records obtained and reviewed including previous labs, and notes from hospitalization for ectopic pregnancy and surgery with Dr. Valentino Saxon  I ordered medications including morphine and Zofran for pain, patient unable to tolerate NSAIDs due to working diagnosis of ulcerative colitis  Lab Tests:  I Ordered, reviewed, and interpreted labs, which included: Despite reported bleeding for 2 weeks fortunately patient's  hemoglobin is excellent at 14, no leukocytosis, no significant electrolyte derangements and normal renal function, UA with blood as expected but otherwise no signs of infection, wet prep is normal and pregnancy is negative.  Imaging Studies ordered:  I ordered imaging studies which included pelvic ultrasound, I independently visualized and interpreted imaging which showed small subendometrial cyst noted but otherwise unremarkable pelvic ultrasound, no evidence of ovarian cyst and normal Doppler flow to both ovaries.  ED Course:   Discussed reassuring results with patient.  I am reassured that despite 2 weeks of heavy bleeding hemoglobin is 14 and she had no active bleeding on exam.  I suspect pain is in the setting of her endometriosis.  Lower suspicion for intra-abdominal cause especially given pain is directly associated with vaginal bleeding.  I called and discussed case with Dr. Lanny Cramp, on-call for [facility], discussed pertinent labs and imaging, she does not recommend changing Aygestin dosing at this time, recommends the patient follow-up in the office with Dr. Valentino Saxon as soon as possible.  Discussed these recommendations with patient.  Given increased pain will prescribe a short course of stronger pain medication since she is unable to tolerate NSAIDs.  Discussed the importance of close outpatient follow-up with her gynecologist.  Patient expresses understanding and agreement.  Discharged home in good condition.  I did  consider admission for the patient given ongoing bleeding but fortunately her hemoglobin is stable and she will not require admission at this time.    Portions of this note were generated with [employer]. Dictation errors may occur despite best attempts at proofreading.         Final Clinical Impression(s) / ED Diagnoses Final diagnoses:  Vaginal bleeding    Rx / DC Orders ED Discharge Orders          Ordered     04/01/22 1550    HYDROcodone-acetaminophen (NORCO) 5-325 MG tablet  Every 6 hours PRN        04/01/22 1551              Jacqlyn Larsen, PA-C 04/02/22 0920    Noemi Chapel, MD 04/03/22 [phone]

## 2022-04-01 NOTE — Discharge Instructions (Addendum)
Your blood work was very reassuring, despite bleeding your hemoglobin looks good.  Your ultrasound shows an endometrial cyst but otherwise looks good.  I suspect your bleeding and pain is more so related to your endometriosis.  Please continue taking Aygestin at 15 mg dose for now and call to schedule close follow-up appointment with Dr. Valentino Saxon.  You can use prescribed pain medication as needed for severe pain in addition to this you can take 500 or 650 mg of Tylenol every 6 hours.

## 2022-04-01 NOTE — ED Notes (Signed)
Pt verbalizes understanding of discharge instructions. Opportunity for questions and answers were provided. Pt discharged from the ED.   ?

## 2022-04-01 NOTE — ED Notes (Signed)
Pt informed RN of redness to R arm proximal to IV site. Site assessed by RN, mild redness noted, no rash or swelling at IV site or on arm. Merleen Nicely, [place] notified, med orders placed at this time.

## 2022-04-03 LAB — GC/CHLAMYDIA PROBE AMP (~~LOC~~) NOT AT ARMC
Chlamydia: NEGATIVE
Comment: NEGATIVE
Comment: NORMAL
Neisseria Gonorrhea: NEGATIVE

## 2022-04-15 ENCOUNTER — Encounter (HOSPITAL_COMMUNITY): Payer: Self-pay

## 2022-04-15 ENCOUNTER — Ambulatory Visit (HOSPITAL_COMMUNITY)
Admission: EM | Admit: 2022-04-15 | Discharge: 2022-04-15 | Disposition: A | Discharge status: Home / Self Care | Payer: 59 | Attending: Internal Medicine | Admitting: Internal Medicine

## 2022-04-15 DIAGNOSIS — R21 Rash and other nonspecific skin eruption: Secondary | ICD-10-CM

## 2022-04-15 DIAGNOSIS — L299 Pruritus, unspecified: Secondary | ICD-10-CM

## 2022-04-15 MED ORDER — DOXYCYCLINE HYCLATE 100 MG PO CAPS: 100.0000 mg | ORAL_CAPSULE | Freq: Two times a day (BID) | ORAL | 0 refills | Status: AC

## 2022-04-15 MED ORDER — METHYLPREDNISOLONE SODIUM SUCC 125 MG IJ SOLR
80.0000 mg | Freq: Once | INTRAMUSCULAR | Status: AC
  Administered 2022-04-15: 80 mg via INTRAMUSCULAR

## 2022-04-15 MED ORDER — VALACYCLOVIR HCL 1 G PO TABS: 1000.0000 mg | ORAL_TABLET | Freq: Three times a day (TID) | ORAL | 0 refills | Status: AC

## 2022-04-15 MED ORDER — PREDNISONE 10 MG PO TABS: 40.0000 mg | ORAL_TABLET | Freq: Every day | ORAL | 0 refills | Status: AC

## 2022-04-15 MED ORDER — METHYLPREDNISOLONE SODIUM SUCC 125 MG IJ SOLR
INTRAMUSCULAR | Status: AC
  Filled 2022-04-15: qty 2

## 2022-04-15 MED ORDER — DOXYCYCLINE HYCLATE 100 MG PO CAPS: 100.0000 mg | ORAL_CAPSULE | Freq: Two times a day (BID) | ORAL | 0 refills | Status: DC

## 2022-04-15 NOTE — Discharge Instructions (Addendum)
You were seen in urgent care for your rash.  Your rash may be caused by information, allergy, or virus.  We plan to treat you to cover all potential causes in hopes to resolve your rash due to unknown cause.  Take valacyclovir 3 times daily for the next 7 days.  Take doxycycline twice a day for the next 7 days. Take 40 mg of prednisone once daily starting tomorrow for the next 5 days.  Do not take any prednisone today since I gave you an injection in the clinic.  Be sure to take prednisone with food to avoid GI upset.  If you develop any new or worsening symptoms or do not improve in the next 2 to 3 days, please return.  If your symptoms are severe, please go to the emergency room.  Follow-up with your primary care provider for further evaluation and management of your symptoms as well as ongoing wellness visits.  I hope you feel better!

## 2022-04-15 NOTE — ED Provider Notes (Signed)
[place]    CSN: [id number] Arrival date & time: 04/15/22  1651      History   Chief Complaint Chief Complaint  Patient presents with   Rash    HPI Barbara Orozco is a 36 y.o. female.   To urgent care for evaluation of rash that started to her ankles and behind her knees and spread to the rest of her body.  She states that rash is very itchy and painful.  She describes the pain as a "burning pain".  Patient has frequent shingles outbreaks for which she uses a antiviral cream.  She is applied this to her rash with minimal relief.  Denies recent exposure to herpes virus.  Also denies recent exposure to new irritants or allergens.  She took 1 dose of 10 mg of prednisone to try to help with the rash today with minimal relief.  She has been taking Tylenol for her pain with no relief and is not able to take ibuprofen due to stomach side effects.      Rash   Past Medical History:  Diagnosis Date   Allergy    Anal fistula    Arm fracture    right and left   Asthma    Complication of anesthesia    "wakes up angry for about 5 minutes"   Depression    Endometriosis    Endometriosis    Foot pain    right foot hurts since last night no reported injury per pt on 01-25-2022   GERD (gastroesophageal reflux disease)    History of 2019 novel coronavirus disease (COVID-19) 04/29/2020   + Ag test - can see in Care everywhere   History of chicken pox    had 4 or 5 times as child   History of COVID-19 04/29/2020   mild all symptoms resolved   Irritable bowel    Leg fracture, left    Psychogenic nonepileptic seizure    last seizure 1 month ago per pt on 01-25-2022 saw dr Leta Jungling neurology   Seizure [facility])    last seizure 12-28-2021 per pt on 01-25-2022   Shingles    intermittent outbreaks   Ulcerative colitis ([place])    Wears glasses     Patient Active Problem List   Diagnosis Date Noted   Crohn's disease ([place]) 07/06/2021   LUQ pain    Diarrhea    Family history of  diabetes mellitus 01/22/2020   History of rectal abscess 01/22/2020   History of stomach ulcers 01/22/2020   Ulcerative colitis with rectal bleeding ([place]) 01/22/2020   Morbid obesity ([place]) 01/22/2020   Mild intermittent asthma without complication 19/37/9024    Past Surgical History:  Procedure Laterality Date   APPENDECTOMY     BIOPSY  06/06/2020   Procedure: BIOPSY;  Surgeon: Gatha Mayer, MD;  [facility]: [facility];  Service: Endoscopy;;   COLONOSCOPY WITH PROPOFOL N/A 06/06/2020   Procedure: COLONOSCOPY WITH PROPOFOL;  Surgeon: Gatha Mayer, MD;  [facility];  Service: Endoscopy;  Laterality: N/A;   DIAGNOSTIC LAPAROSCOPY WITH REMOVAL OF ECTOPIC PREGNANCY Right 12/11/2021   Procedure: LAPAROSCOPIC RIGHT SALPINGECTOMY WITH REMOVAL OF ECTOPIC PREGNANCY, lysis of adhesions;  Surgeon: Aloha Gell, MD;  Location: [place];  Service: Gynecology;  Laterality: Right;   ESOPHAGOGASTRODUODENOSCOPY (EGD) WITH PROPOFOL N/A 06/06/2020   Procedure: ESOPHAGOGASTRODUODENOSCOPY (EGD) WITH PROPOFOL;  Surgeon: Gatha Mayer, MD;  [facility];  Service: Endoscopy;  Laterality: N/A;   HAND SURGERY Left  x3   PLACEMENT OF SETON N/A 01/30/2022   Procedure: PLACEMENT OF SETON;  Surgeon: Leighton Ruff, MD;  [facility];  Service: General;  Laterality: N/A;   RECTAL EXAM UNDER ANESTHESIA N/A 01/30/2022   Procedure: ANAL EXAM UNDER ANESTHESIA;  Surgeon: Leighton Ruff, MD;  [facility];  Service: General;  Laterality: N/A;    OB History     Gravida  4   Para  0   Term  0   Preterm  0   AB  2   Living  0      SAB  2   IAB      Ectopic      Multiple      Live Births  0            Home Medications    Prior to Admission medications   Medication Sig Start Date End Date Taking? Authorizing Provider  acetaminophen (TYLENOL) 500 MG tablet Take 1,000 mg by mouth daily as needed for headache (pain).    [provider]  albuterol (VENTOLIN  HFA) 108 (90 Base) MCG/ACT inhaler Inhale 2 puffs into the lungs every 4 (four) hours as needed for shortness of breath. Patient taking differently: Inhale 2 puffs into the lungs every 4 (four) hours as needed for shortness of breath (seasonal allergies/summer). 06/26/21   Denita Lung, MD  dicyclomine (BENTYL) 10 MG capsule Take 1 capsule (10 mg total) by mouth 4 (four) times daily -  before meals and at bedtime. Patient taking differently: Take 10 mg by mouth at bedtime as needed (stomach cramps). 12/06/21   Gatha Mayer, MD  HYDROcodone-acetaminophen (NORCO) 5-325 MG tablet Take 1 tablet by mouth every 6 (six) hours as needed. 04/01/22   Jacqlyn Larsen, PA-C  lidocaine (XYLOCAINE) 2 % jelly Apply perianally and just inside the anus TID prn  for anal pain 07/12/21   Willia Craze, NP  loperamide (IMODIUM) 2 MG capsule Take 2 mg by mouth daily as needed (diarrhea/loose stools).    [provider]  MELATONIN PO Take 1 tablet by mouth at bedtime as needed (sleep). gummie    [provider]  Multiple Vitamin (MULTIVITAMIN WITH MINERALS) TABS tablet Take 1 tablet by mouth daily.    [provider]  norethindrone (AYGESTIN) 5 MG tablet Take 10 mg by mouth every morning.    [provider]  ondansetron (ZOFRAN-ODT) 4 MG disintegrating tablet DISSOLVE 1 TABLET IN MOUTH EVERY 8 HOURS AS NEEDED FOR NAUSEA FOR VOMITING 03/15/22   Gatha Mayer, MD  pantoprazole (PROTONIX) 40 MG tablet TAKE 1 TABLET BY MOUTH TWICE DAILY BEFORE MEAL(S) 02/01/22   Gatha Mayer, MD    Family History Family History  Problem Relation Age of Onset   Colitis Mother    Ulcerative colitis Mother    Heart disease Mother    Other Mother    Colitis Father    Ulcerative colitis Father    Diabetes Father    Stomach cancer Paternal Aunt    Colitis Paternal Grandmother    Ulcerative colitis Paternal Grandmother    Colon cancer Neg Hx    Pancreatic cancer Neg Hx    Esophageal cancer Neg  Hx     Social History Social History   Tobacco Use   Smoking status: Former    Types: Cigarettes   Smokeless tobacco: Never   Tobacco comments:    Social smoker quit 6-8 yrs ago per pt on 01-25-2022  Vaping Use   Vaping Use: Never used  Substance Use Topics   Alcohol use: Not Currently    Comment: Ocaasionally   Drug use: Not Currently     Allergies   Bee venom, Pork allergy, Tramadol, Nsaids, and Lithium   Review of Systems Review of Systems  Skin:  Positive for rash.    Pink macules with areas of ulceration.  No drainage from rash. Area of erythema and plaque looking lesion to the top of the gluteal fold at the sacrum. No warmth, erythema, or drainage.    Physical Exam Triage Vital Signs ED Triage Vitals  Enc Vitals Group     BP 04/15/22 1732 126/90     Pulse Rate 04/15/22 1732 87     Resp 04/15/22 1732 18     Temp 04/15/22 1732 98.4 F (36.9 C)     Temp Source 04/15/22 1732 Oral     SpO2 04/15/22 1732 98 %     Weight --      Height --      Head Circumference --      Peak Flow --      Pain Score 04/15/22 1731 6     Pain Loc --      Pain Edu? --      Excl. in [place]? --    No data found.  Updated Vital Signs BP 126/90   Pulse 87   Temp 98.4 F (36.9 C) (Oral)   Resp 18   LMP 10/26/2021 Comment: R ectopic s/p salpingectomy 12-11-21  SpO2 98%   Visual Acuity Right Eye Distance:   Left Eye Distance:   Bilateral Distance:    Right Eye Near:   Left Eye Near:    Bilateral Near:     Physical Exam   UC Treatments / Results  Labs (all labs ordered are listed, but only abnormal results are displayed) Labs Reviewed - No data to display  EKG   Radiology No results found.  Procedures Procedures (including critical care time)  Medications Ordered in UC Medications - No data to display  Initial Impression / Assessment and Plan / UC Course  I have reviewed the triage vital signs and the nursing notes.  Pertinent labs & imaging results that  were available during my care of the patient were reviewed by me and considered in my medical decision making (see chart for details).     *** Final Clinical Impressions(s) / UC Diagnoses   Final diagnoses:  None   Discharge Instructions   None    ED Prescriptions   None    PDMP not reviewed this encounter.

## 2022-04-15 NOTE — ED Triage Notes (Signed)
Patient presents to Urgent Care with complaints of rash on legs, and arms, abdomin, back, chest since last week. Patient reports rash is burning in nature. Pt reports taking benadryl and her grandmothers prednisone. Pt reports antiviral cream as well.

## 2022-04-17 ENCOUNTER — Telehealth: Payer: Self-pay | Admitting: Internal Medicine

## 2022-04-17 NOTE — Telephone Encounter (Signed)
Patient returning call please give her a call back to advise.  Thank You

## 2022-04-17 NOTE — Telephone Encounter (Signed)
Patient called.  She has colon scheduled for 04/26/22 with Dr. Carlean Purl.  She recently broke out into a rash all over her body - it even went into her eyes - and subsequently has been taking high doses of steroids.  She wanted to know if that would defeat the purpose of the colonoscopy.  She would like to discuss this with you.  Please call and advise.  Thank you.

## 2022-04-17 NOTE — Telephone Encounter (Unsigned)
Pt stated she recently broke out into a rash all over her body - it even went into her eyes - and subsequently has been taking high doses of steroids.  She wanted to know if that would defeat the purpose of the colonoscopy: Pt stated that she had a steroid injection on the  June 12th; started taking Prednisone 40 mg on the June 13th. Pt stated that this is a 7 day supply: Pt is scheduled for a colonoscopy on 04/26/2022 and wonders if Dr. Leone Payor wants her to reschedule this: Please Advise

## 2022-04-18 ENCOUNTER — Other Ambulatory Visit: Payer: Self-pay

## 2022-04-18 DIAGNOSIS — K529 Noninfective gastroenteritis and colitis, unspecified: Secondary | ICD-10-CM

## 2022-04-18 DIAGNOSIS — Z8711 Personal history of peptic ulcer disease: Secondary | ICD-10-CM

## 2022-04-18 NOTE — Telephone Encounter (Signed)
We are trying to do the colonoscopy off steroids.  We need to move it to July, I think

## 2022-04-18 NOTE — Telephone Encounter (Signed)
Pt was made aware of Dr. Carlean Purl recommendations: Pt colonoscopy rescheduled to 05/23/2022 at 4:00 PM: Pt made aware: Ambulatory referral to GI placed: Prep instructions were created for pt and sent to pt via my chart: Pt made aware: Pt verbalized understanding with all questions answered.

## 2022-04-26 ENCOUNTER — Encounter: Payer: 59 | Admitting: Internal Medicine

## 2022-04-26 ENCOUNTER — Ambulatory Visit (HOSPITAL_COMMUNITY)
Admission: EM | Admit: 2022-04-26 | Discharge: 2022-04-26 | Disposition: A | Discharge status: Home / Self Care | Payer: 59 | Attending: Physician Assistant | Admitting: Physician Assistant

## 2022-04-26 DIAGNOSIS — R21 Rash and other nonspecific skin eruption: Secondary | ICD-10-CM

## 2022-04-26 MED ORDER — METHYLPREDNISOLONE SODIUM SUCC 125 MG IJ SOLR
INTRAMUSCULAR | Status: AC
  Filled 2022-04-26: qty 2

## 2022-04-26 MED ORDER — HYDROXYZINE HCL 25 MG PO TABS: 25.0000 mg | ORAL_TABLET | Freq: Three times a day (TID) | ORAL | 0 refills | Status: DC | PRN

## 2022-04-26 MED ORDER — METHYLPREDNISOLONE SODIUM SUCC 125 MG IJ SOLR
125.0000 mg | Freq: Once | INTRAMUSCULAR | Status: AC
  Administered 2022-04-26: 125 mg via INTRAMUSCULAR

## 2022-04-26 MED ORDER — PREDNISONE 50 MG PO TABS: ORAL_TABLET | ORAL | 0 refills | Status: DC

## 2022-04-26 NOTE — ED Triage Notes (Signed)
Patient presents for a follow-up on rash all over her body. Pt reports rash is not getting better,she completed all her medication with no improvement.

## 2022-04-26 NOTE — Discharge Instructions (Addendum)
Follow up with dermatology for recheck

## 2022-04-26 NOTE — ED Provider Notes (Signed)
[place]    CSN: [id number] Arrival date & time: 04/26/22  1755      History   Chief Complaint Chief Complaint  Patient presents with   Rash    HPI Barbara Orozco is a 36 y.o. female.   Pt complains of a rash  full body.  Pt reports rash is itchy.  Pt was treated with doxycycline valtrex and prednsione   The history is provided by the patient. No language interpreter was used.  Rash Location:  Full body Severity:  Moderate Onset quality:  Gradual    Past Medical History:  Diagnosis Date   Allergy    Anal fistula    Arm fracture    right and left   Asthma    Complication of anesthesia    "wakes up angry for about 5 minutes"   Depression    Endometriosis    Endometriosis    Foot pain    right foot hurts since last night no reported injury per pt on 01-25-2022   GERD (gastroesophageal reflux disease)    History of 2019 novel coronavirus disease (COVID-19) 04/29/2020   + Ag test - can see in Care everywhere   History of chicken pox    had 4 or 5 times as child   History of COVID-19 04/29/2020   mild all symptoms resolved   Irritable bowel    Leg fracture, left    Psychogenic nonepileptic seizure    last seizure 1 month ago per pt on 01-25-2022 saw dr Leta Jungling neurology   Seizure [facility])    last seizure 12-28-2021 per pt on 01-25-2022   Shingles    intermittent outbreaks   Ulcerative colitis ([place])    Wears glasses     Patient Active Problem List   Diagnosis Date Noted   Crohn's disease ([place]) 07/06/2021   LUQ pain    Diarrhea    Family history of diabetes mellitus 01/22/2020   History of rectal abscess 01/22/2020   History of stomach ulcers 01/22/2020   Ulcerative colitis with rectal bleeding ([place]) 01/22/2020   Morbid obesity ([place]) 01/22/2020   Mild intermittent asthma without complication 41/32/4401    Past Surgical History:  Procedure Laterality Date   APPENDECTOMY     BIOPSY  06/06/2020   Procedure: BIOPSY;  Surgeon: Gatha Mayer, MD;   [facility];  Service: Endoscopy;;   COLONOSCOPY WITH PROPOFOL N/A 06/06/2020   Procedure: COLONOSCOPY WITH PROPOFOL;  Surgeon: Gatha Mayer, MD;  [facility];  Service: Endoscopy;  Laterality: N/A;   DIAGNOSTIC LAPAROSCOPY WITH REMOVAL OF ECTOPIC PREGNANCY Right 12/11/2021   Procedure: LAPAROSCOPIC RIGHT SALPINGECTOMY WITH REMOVAL OF ECTOPIC PREGNANCY, lysis of adhesions;  Surgeon: Aloha Gell, MD;  Location: [place];  Service: Gynecology;  Laterality: Right;   ESOPHAGOGASTRODUODENOSCOPY (EGD) WITH PROPOFOL N/A 06/06/2020   Procedure: ESOPHAGOGASTRODUODENOSCOPY (EGD) WITH PROPOFOL;  Surgeon: Gatha Mayer, MD;  [facility];  Service: Endoscopy;  Laterality: N/A;   HAND SURGERY Left    x3   PLACEMENT OF SETON N/A 01/30/2022   Procedure: PLACEMENT OF SETON;  Surgeon: Leighton Ruff, MD;  [facility];  Service: General;  Laterality: N/A;   RECTAL EXAM UNDER ANESTHESIA N/A 01/30/2022   Procedure: ANAL EXAM UNDER ANESTHESIA;  Surgeon: Leighton Ruff, MD;  [facility];  Service: General;  Laterality: N/A;    OB History     Gravida  4   Para  0   Term  0   Preterm  0   AB  2   Living  0      SAB  2   IAB      Ectopic      Multiple      Live Births  0            Home Medications    Prior to Admission medications   Medication Sig Start Date End Date Taking? Authorizing Provider  hydrOXYzine (ATARAX) 25 MG tablet Take 1 tablet (25 mg total) by mouth 3 (three) times daily as needed. 04/26/22  Yes Caryl Ada K, PA-C  predniSONE (DELTASONE) 50 MG tablet One tablet a day for 7 days 04/26/22  Yes Fransico Meadow, PA-C  acetaminophen (TYLENOL) 500 MG tablet Take 1,000 mg by mouth daily as needed for headache (pain).    [provider]  albuterol (VENTOLIN HFA) 108 (90 Base) MCG/ACT inhaler Inhale 2 puffs into the lungs every 4 (four) hours as needed for shortness of breath. Patient taking differently: Inhale 2 puffs into the  lungs every 4 (four) hours as needed for shortness of breath (seasonal allergies/summer). 06/26/21   Denita Lung, MD  dicyclomine (BENTYL) 10 MG capsule Take 1 capsule (10 mg total) by mouth 4 (four) times daily -  before meals and at bedtime. Patient taking differently: Take 10 mg by mouth at bedtime as needed (stomach cramps). 12/06/21   Gatha Mayer, MD  HYDROcodone-acetaminophen (NORCO) 5-325 MG tablet Take 1 tablet by mouth every 6 (six) hours as needed. 04/01/22   Jacqlyn Larsen, PA-C  lidocaine (XYLOCAINE) 2 % jelly Apply perianally and just inside the anus TID prn  for anal pain 07/12/21   Willia Craze, NP  loperamide (IMODIUM) 2 MG capsule Take 2 mg by mouth daily as needed (diarrhea/loose stools).    [provider]  MELATONIN PO Take 1 tablet by mouth at bedtime as needed (sleep). gummie    [provider]  Multiple Vitamin (MULTIVITAMIN WITH MINERALS) TABS tablet Take 1 tablet by mouth daily.    [provider]  norethindrone (AYGESTIN) 5 MG tablet Take 10 mg by mouth every morning.    [provider]  ondansetron (ZOFRAN-ODT) 4 MG disintegrating tablet DISSOLVE 1 TABLET IN MOUTH EVERY 8 HOURS AS NEEDED FOR NAUSEA FOR VOMITING 03/15/22   Gatha Mayer, MD  pantoprazole (PROTONIX) 40 MG tablet TAKE 1 TABLET BY MOUTH TWICE DAILY BEFORE MEAL(S) 02/01/22   Gatha Mayer, MD    Family History Family History  Problem Relation Age of Onset   Colitis Mother    Ulcerative colitis Mother    Heart disease Mother    Other Mother    Colitis Father    Ulcerative colitis Father    Diabetes Father    Stomach cancer Paternal Aunt    Colitis Paternal Grandmother    Ulcerative colitis Paternal Grandmother    Colon cancer Neg Hx    Pancreatic cancer Neg Hx    Esophageal cancer Neg Hx     Social History Social History   Tobacco Use   Smoking status: Former    Types: Cigarettes   Smokeless tobacco: Never   Tobacco comments:    Social smoker  quit 6-8 yrs ago per pt on 01-25-2022  Vaping Use   Vaping Use: Never used  Substance Use Topics   Alcohol use: Not Currently    Comment: Ocaasionally   Drug use: Not Currently     Allergies   Bee venom,  Pork allergy, Tramadol, Nsaids, and Lithium   Review of Systems Review of Systems  Skin:  Positive for rash.  All other systems reviewed and are negative.    Physical Exam Triage Vital Signs ED Triage Vitals [04/26/22 1811]  Enc Vitals Group     BP 130/77     Pulse Rate 68     Resp 16     Temp 98.4 F (36.9 C)     Temp Source Oral     SpO2 100 %     Weight      Height      Head Circumference      Peak Flow      Pain Score      Pain Loc      Pain Edu?      Excl. in [place]?    No data found.  Updated Vital Signs BP 130/77 (BP Location: Left Arm)   Pulse 68   Temp 98.4 F (36.9 C) (Oral)   Resp 16   LMP 10/26/2021 Comment: R ectopic s/p salpingectomy 12-11-21  SpO2 100%   Visual Acuity Right Eye Distance:   Left Eye Distance:   Bilateral Distance:    Right Eye Near:   Left Eye Near:    Bilateral Near:     Physical Exam Vitals and nursing note reviewed.  Constitutional:      Appearance: She is well-developed.  HENT:     Head: Normocephalic.  Pulmonary:     Effort: Pulmonary effort is normal.  Abdominal:     General: There is no distension.  Musculoskeletal:        General: Normal range of motion.     Cervical back: Normal range of motion.  Skin:    Findings: Erythema present.     Comments: Multiple red raised areas,    Neurological:     Mental Status: She is alert and oriented to person, place, and time.      UC Treatments / Results  Labs (all labs ordered are listed, but only abnormal results are displayed) Labs Reviewed - No data to display  EKG   Radiology No results found.  Procedures Procedures (including critical care time)  Medications Ordered in UC Medications  methylPREDNISolone sodium succinate (SOLU-MEDROL) 125 mg/2 mL  injection 125 mg (125 mg Intramuscular Given 04/26/22 1904)    Initial Impression / Assessment and Plan / UC Course  I have reviewed the triage vital signs and the nursing notes.  Pertinent labs & imaging results that were available during my care of the patient were reviewed by me and considered in my medical decision making (see chart for details).     MDM: I suspect some type of dermatitis.  Pt given rx for prednisone and atarax.  Pt advised to schedule to see dermatology Final Clinical Impressions(s) / UC Diagnoses   Final diagnoses:  Rash     Discharge Instructions      Follow up with dermatology for recheck    ED Prescriptions     Medication Sig Dispense Auth. Provider   predniSONE (DELTASONE) 50 MG tablet One tablet a day for 7 days 7 tablet Caryl Ada K, PA-C   hydrOXYzine (ATARAX) 25 MG tablet Take 1 tablet (25 mg total) by mouth 3 (three) times daily as needed. 30 tablet Fransico Meadow, [place]      PDMP not reviewed this encounter.   Fransico Meadow, [place] 04/26/22 1929

## 2022-05-10 DIAGNOSIS — R69 Illness, unspecified: Secondary | ICD-10-CM | POA: Diagnosis not present

## 2022-05-10 DIAGNOSIS — N809 Endometriosis, unspecified: Secondary | ICD-10-CM | POA: Diagnosis not present

## 2022-05-16 ENCOUNTER — Encounter: Payer: Self-pay | Admitting: Internal Medicine

## 2022-05-20 ENCOUNTER — Encounter: Payer: Self-pay | Admitting: Certified Registered Nurse Anesthetist

## 2022-05-22 ENCOUNTER — Telehealth: Payer: Self-pay | Admitting: Internal Medicine

## 2022-05-22 NOTE — Telephone Encounter (Signed)
OK No charge
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
## 2022-05-23 ENCOUNTER — Ambulatory Visit (HOSPITAL_COMMUNITY)
Admission: EM | Admit: 2022-05-23 | Discharge: 2022-05-24 | Disposition: A | Discharge status: Home / Self Care | Payer: 59 | Attending: Student | Admitting: Student

## 2022-05-23 ENCOUNTER — Encounter (HOSPITAL_COMMUNITY): Payer: Self-pay | Admitting: Student

## 2022-05-23 ENCOUNTER — Encounter: Payer: 59 | Admitting: Internal Medicine

## 2022-05-23 DIAGNOSIS — Z20822 Contact with and (suspected) exposure to covid-19: Secondary | ICD-10-CM | POA: Insufficient documentation

## 2022-05-23 DIAGNOSIS — R45851 Suicidal ideations: Secondary | ICD-10-CM | POA: Insufficient documentation

## 2022-05-23 DIAGNOSIS — Z8616 Personal history of COVID-19: Secondary | ICD-10-CM | POA: Insufficient documentation

## 2022-05-23 DIAGNOSIS — F1421 Cocaine dependence, in remission: Secondary | ICD-10-CM | POA: Diagnosis not present

## 2022-05-23 DIAGNOSIS — K589 Irritable bowel syndrome without diarrhea: Secondary | ICD-10-CM | POA: Diagnosis not present

## 2022-05-23 DIAGNOSIS — Z9151 Personal history of suicidal behavior: Secondary | ICD-10-CM | POA: Diagnosis not present

## 2022-05-23 DIAGNOSIS — F1290 Cannabis use, unspecified, uncomplicated: Secondary | ICD-10-CM

## 2022-05-23 DIAGNOSIS — T1491XA Suicide attempt, initial encounter: Secondary | ICD-10-CM

## 2022-05-23 DIAGNOSIS — F430 Acute stress reaction: Secondary | ICD-10-CM | POA: Diagnosis not present

## 2022-05-23 DIAGNOSIS — K219 Gastro-esophageal reflux disease without esophagitis: Secondary | ICD-10-CM

## 2022-05-23 DIAGNOSIS — F603 Borderline personality disorder: Secondary | ICD-10-CM

## 2022-05-23 DIAGNOSIS — F4321 Adjustment disorder with depressed mood: Secondary | ICD-10-CM

## 2022-05-23 DIAGNOSIS — Z79899 Other long term (current) drug therapy: Secondary | ICD-10-CM | POA: Insufficient documentation

## 2022-05-23 DIAGNOSIS — R4586 Emotional lability: Secondary | ICD-10-CM | POA: Diagnosis not present

## 2022-05-23 DIAGNOSIS — F445 Conversion disorder with seizures or convulsions: Secondary | ICD-10-CM | POA: Diagnosis present

## 2022-05-23 DIAGNOSIS — N809 Endometriosis, unspecified: Secondary | ICD-10-CM

## 2022-05-23 DIAGNOSIS — Z630 Problems in relationship with spouse or partner: Secondary | ICD-10-CM | POA: Diagnosis not present

## 2022-05-23 DIAGNOSIS — F4311 Post-traumatic stress disorder, acute: Secondary | ICD-10-CM | POA: Diagnosis not present

## 2022-05-23 DIAGNOSIS — Z7289 Other problems related to lifestyle: Secondary | ICD-10-CM

## 2022-05-23 DIAGNOSIS — R69 Illness, unspecified: Secondary | ICD-10-CM | POA: Diagnosis not present

## 2022-05-23 HISTORY — DX: Cannabis use, unspecified, uncomplicated: F12.90

## 2022-05-23 HISTORY — DX: Cocaine dependence, in remission: F14.21

## 2022-05-23 HISTORY — DX: Post-traumatic stress disorder, acute: F43.11

## 2022-05-23 HISTORY — DX: Other problems related to lifestyle: Z72.89

## 2022-05-23 HISTORY — DX: Adjustment disorder with depressed mood: F43.21

## 2022-05-23 LAB — CBC WITH DIFFERENTIAL/PLATELET
Abs Immature Granulocytes: 0.04 10*3/uL (ref 0.00–0.07)
Basophils Absolute: 0.1 10*3/uL (ref 0.0–0.1)
Basophils Relative: 1 %
Eosinophils Absolute: 0.2 10*3/uL (ref 0.0–0.5)
Eosinophils Relative: 2 %
HCT: 43.1 % (ref 36.0–46.0)
Hemoglobin: 14.6 g/dL (ref 12.0–15.0)
Immature Granulocytes: 0 %
Lymphocytes Relative: 23 %
Lymphs Abs: 2.3 10*3/uL (ref 0.7–4.0)
MCH: 28.7 pg (ref 26.0–34.0)
MCHC: 33.9 g/dL (ref 30.0–36.0)
MCV: 84.7 fL (ref 80.0–100.0)
Monocytes Absolute: 0.6 10*3/uL (ref 0.1–1.0)
Monocytes Relative: 6 %
Neutro Abs: 6.8 10*3/uL (ref 1.7–7.7)
Neutrophils Relative %: 68 %
Platelets: 448 10*3/uL — ABNORMAL HIGH (ref 150–400)
RBC: 5.09 MIL/uL (ref 3.87–5.11)
RDW: 14.5 % (ref 11.5–15.5)
WBC: 10 10*3/uL (ref 4.0–10.5)
nRBC: 0 % (ref 0.0–0.2)

## 2022-05-23 LAB — LIPID PANEL
Cholesterol: 279 mg/dL — ABNORMAL HIGH (ref 0–200)
HDL: 28 mg/dL — ABNORMAL LOW (ref >40)
LDL Cholesterol: 220 mg/dL — ABNORMAL HIGH (ref 0–99)
Total CHOL/HDL Ratio: 10 RATIO
Triglycerides: 156 mg/dL — ABNORMAL HIGH (ref <150)
VLDL: 31 mg/dL (ref 0–40)

## 2022-05-23 LAB — COMPREHENSIVE METABOLIC PANEL
ALT: 21 U/L (ref 0–44)
AST: 20 U/L (ref 15–41)
Albumin: 3.7 g/dL (ref 3.5–5.0)
Alkaline Phosphatase: 94 U/L (ref 38–126)
Anion gap: 9 (ref 5–15)
BUN: 10 mg/dL (ref 6–20)
CO2: 20 mmol/L — ABNORMAL LOW (ref 22–32)
Calcium: 9.1 mg/dL (ref 8.9–10.3)
Chloride: 110 mmol/L (ref 98–111)
Creatinine, Ser: 0.82 mg/dL (ref 0.44–1.00)
GFR, Estimated: >60 mL/min (ref >60)
Glucose, Bld: 95 mg/dL (ref 70–99)
Potassium: 4.1 mmol/L (ref 3.5–5.1)
Sodium: 139 mmol/L (ref 135–145)
Total Bilirubin: 0.9 mg/dL (ref 0.3–1.2)
Total Protein: 7.2 g/dL (ref 6.5–8.1)

## 2022-05-23 LAB — RESP PANEL BY RT-PCR (FLU A&B, COVID) ARPGX2
Influenza A by PCR: NEGATIVE
Influenza B by PCR: NEGATIVE
SARS Coronavirus 2 by RT PCR: NEGATIVE

## 2022-05-23 LAB — TSH: TSH: 1.61 u[IU]/mL (ref 0.350–4.500)

## 2022-05-23 LAB — HEMOGLOBIN A1C
Hgb A1c MFr Bld: 5.2 % (ref 4.8–5.6)
Mean Plasma Glucose: 102.54 mg/dL

## 2022-05-23 MED ORDER — ALUM & MAG HYDROXIDE-SIMETH 200-200-20 MG/5ML PO SUSP: 30.0000 mL | ORAL | Status: DC | PRN

## 2022-05-23 MED ORDER — NORETHINDRONE ACETATE 5 MG PO TABS
10.0000 mg | ORAL_TABLET | Freq: Every morning | ORAL | Status: DC
  Administered 2022-05-24: 10 mg via ORAL
  Filled 2022-05-23 (×3): qty 2

## 2022-05-23 MED ORDER — ACETAMINOPHEN 325 MG PO TABS
650.0000 mg | ORAL_TABLET | Freq: Four times a day (QID) | ORAL | Status: DC | PRN
  Administered 2022-05-23 – 2022-05-24 (×3): 650 mg via ORAL
  Filled 2022-05-23 (×3): qty 2

## 2022-05-23 MED ORDER — MAGNESIUM HYDROXIDE 400 MG/5ML PO SUSP: 30.0000 mL | Freq: Every day | ORAL | Status: DC | PRN

## 2022-05-23 MED ORDER — QUETIAPINE FUMARATE 50 MG PO TABS: 50.0000 mg | ORAL_TABLET | Freq: Three times a day (TID) | ORAL | Status: DC | PRN

## 2022-05-23 MED ORDER — HYDROXYZINE HCL 25 MG PO TABS
50.0000 mg | ORAL_TABLET | Freq: Three times a day (TID) | ORAL | Status: DC | PRN
  Administered 2022-05-23 (×2): 50 mg via ORAL
  Filled 2022-05-23 (×2): qty 2

## 2022-05-23 MED ORDER — PANTOPRAZOLE SODIUM 40 MG PO TBEC
40.0000 mg | DELAYED_RELEASE_TABLET | Freq: Two times a day (BID) | ORAL | Status: DC | PRN
  Administered 2022-05-24: 40 mg via ORAL
  Filled 2022-05-23: qty 1

## 2022-05-23 MED ORDER — TRAZODONE HCL 50 MG PO TABS
50.0000 mg | ORAL_TABLET | Freq: Every evening | ORAL | Status: DC | PRN
  Administered 2022-05-23: 50 mg via ORAL
  Filled 2022-05-23: qty 1

## 2022-05-23 MED ORDER — ONDANSETRON 4 MG PO TBDP
4.0000 mg | ORAL_TABLET | Freq: Three times a day (TID) | ORAL | Status: DC | PRN
Start: 2022-05-23 — End: 2022-05-24

## 2022-05-23 NOTE — ED Notes (Signed)
Pt A&O x 4, very distraught, crying tearful.  Monitoring for safety

## 2022-05-23 NOTE — BH Assessment (Signed)
Clinician spoke with pt re: her feelings of frustration and anger re: thinking she was misled when she came to the [facility] for assistance. Pt states she was assured that her medication would be provided for her and that she later found out that the pharmacy does not have it and, thus, cannot provide it for her. She states she has endometriosis and that she has already begun experiencing pain and bleeding due to not having her medication.  Pt also expressed upset thoughts b/c she was told she had the option to stay or be d/c and was later told she would be IVCed if she elected to leave. Pt stated she was misled in this manner. Pt states she was told she would only have to stay for 24 hours and that the night shift then informed her that she was recommended for inpatient hospitalization. Pt expressed feeling that she was lied to.  Clinician sympathized with pt and her upset feelings in regards to her experience.

## 2022-05-23 NOTE — ED Notes (Signed)
Pt was given chicken, rice, veggies, and juice for dinner. Pt is crying and very tearful. I encouraged pt to eat her dinner but pt is too emotional to do so. Will continue to monitor for safety.
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
## 2022-05-23 NOTE — ED Notes (Signed)
Pt resting at present, no distress noted. Calm at present.  Monitoring for safety.

## 2022-05-23 NOTE — ED Provider Notes (Addendum)
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 04/01/2022 138  135 - 145 mmol/L Final   Potassium 04/01/2022 4.1  3.5 - 5.1 mmol/L Final   Chloride 04/01/2022 110  98 - 111 mmol/L Final   CO2 04/01/2022 22  22 - 32 mmol/L Final   Glucose, Bld 04/01/2022 114 (H)  70 - 99 mg/dL Final   Glucose reference range applies only to samples taken after fasting for at least 8 hours.   BUN 04/01/2022 6  6 - 20 mg/dL Final   Creatinine, Ser 04/01/2022 0.77  0.44 - 1.00 mg/dL Final   Calcium 04/01/2022 9.1  8.9 - 10.3 mg/dL Final   GFR, Estimated 04/01/2022 >60  >60 mL/min Final   Comment: (NOTE) Calculated using the CKD-EPI  Creatinine Equation (2021)    Anion gap 04/01/2022 6  5 - 15 Final   Performed at [facility], [place] [address], [place], [place] [address]   WBC 04/01/2022 9.0  4.0 - 10.5 K/uL Final   RBC 04/01/2022 4.97  3.87 - 5.11 MIL/uL Final   Hemoglobin 04/01/2022 14.0  12.0 - 15.0 g/dL Final   HCT 04/01/2022 43.5  36.0 - 46.0 % Final   MCV 04/01/2022 87.5  80.0 - 100.0 fL Final   MCH 04/01/2022 28.2  26.0 - 34.0 pg Final   MCHC 04/01/2022 32.2  30.0 - 36.0 g/dL Final   RDW 04/01/2022 13.8  11.5 - 15.5 % Final   Platelets 04/01/2022 408 (H)  150 - 400 K/uL Final   nRBC 04/01/2022 0.0  0.0 - 0.2 % Final   Neutrophils Relative % 04/01/2022 58  % Final   Neutro Abs 04/01/2022 5.2  1.7 - 7.7 K/uL Final   Lymphocytes Relative 04/01/2022 33  % Final   Lymphs Abs 04/01/2022 3.0  0.7 - 4.0 K/uL Final   Monocytes Relative 04/01/2022 5  % Final   Monocytes Absolute 04/01/2022 0.5  0.1 - 1.0 K/uL Final   Eosinophils Relative 04/01/2022 3  % Final   Eosinophils Absolute 04/01/2022 0.3  0.0 - 0.5 K/uL Final   Basophils Relative 04/01/2022 1  % Final   Basophils Absolute 04/01/2022 0.1  0.0 - 0.1 K/uL Final   Immature Granulocytes 04/01/2022 0  % Final   Abs Immature Granulocytes 04/01/2022 0.02  0.00 - 0.07 K/uL Final   Performed at [facility], [place] [address], [place], [place] [address]   I-stat hCG, quantitative 04/01/2022 <5.0  <5 mIU/mL Final  Comment 3 04/01/2022          Final   Comment:   GEST. AGE      CONC.  (mIU/mL)   <=1 WEEK        5 - 50     2 WEEKS       50 - 500     3 WEEKS       100 - 10,000     4 WEEKS     1,000 - 30,000        FEMALE AND NON-PREGNANT FEMALE:     LESS THAN 5 mIU/mL    Preg Test, Ur 04/01/2022 NEGATIVE  NEGATIVE Final   Comment:        THE SENSITIVITY OF THIS METHODOLOGY IS >20 mIU/mL. Performed at [facility], [place] [address], [place], [place] [address]    Yeast Wet Prep HPF POC 04/01/2022 NONE SEEN  NONE SEEN Final   Trich, Wet Prep  04/01/2022 NONE SEEN  NONE SEEN Corrected   Comment: CORRECTED RESULTS CALLED TO: CORRECTED ON 05/28 AT 1327: PREVIOUSLY REPORTED AS PRESENT    Clue Cells Wet Prep HPF POC 04/01/2022 NONE SEEN  NONE SEEN Corrected   Comment: CORRECTED RESULTS CALLED TO: CORRECTED AND CALLED TO CAMRYN COGIN RN BY [place] AT 7782 PM [id number] CORRECTED ON 05/28 AT 1327: PREVIOUSLY REPORTED AS PRESENT    WBC, Wet Prep HPF POC 04/01/2022 <10  <10 Corrected   Comment: CORRECTED RESULTS CALLED TO: CORRECTED ON 05/28 AT 1327: PREVIOUSLY REPORTED AS >=10    Sperm 04/01/2022 NONE SEEN   Final   Comment: CALLED CORRECTION TO CARMYN COGIN RN BY [place] 1320PM Performed at [facility], [place] [address], [place], [place] [address]    Neisseria Gonorrhea 04/01/2022 Negative   Final   Chlamydia 04/01/2022 Negative   Final   Comment 04/01/2022 Normal Reference Ranger Chlamydia - Negative   Final   Comment 04/01/2022 Normal Reference Range Neisseria Gonorrhea - Negative   Final   Color, Urine 04/01/2022 YELLOW  YELLOW Final   APPearance 04/01/2022 CLEAR  CLEAR Final   Specific Gravity, Urine 04/01/2022 1.020  1.005 - 1.030 Final   pH 04/01/2022 5.0  5.0 - 8.0 Final   Glucose, UA 04/01/2022 NEGATIVE  NEGATIVE mg/dL Final   Hgb urine dipstick 04/01/2022 LARGE (A)  NEGATIVE Final   Bilirubin Urine 04/01/2022 NEGATIVE  NEGATIVE Final   Ketones, ur 04/01/2022 NEGATIVE  NEGATIVE mg/dL Final   Protein, ur 04/01/2022 NEGATIVE  NEGATIVE mg/dL Final   Nitrite 04/01/2022 NEGATIVE  NEGATIVE Final   Leukocytes,Ua 04/01/2022 NEGATIVE  NEGATIVE Final   RBC / HPF 04/01/2022 0-5  0 - 5 RBC/hpf Final   WBC, UA 04/01/2022 0-5  0 - 5 WBC/hpf Final   Bacteria, UA 04/01/2022 NONE SEEN  NONE SEEN Final   Squamous Epithelial / LPF 04/01/2022 0-5  0 - 5 Final   Mucus 04/01/2022 PRESENT   Final   Performed at [facility], [place] [address] [place], [place] [address]  Appointment on 03/07/2022  Component Date Value Ref Range  Status   CRP 03/07/2022 1.5  0.5 - 20.0 mg/dL Final   Sed Rate 03/07/2022 34 (H)  0 - 20 mm/hr Final   Calprotectin, Fecal 03/07/2022 18  0 - 120 ug/g Final   Comment: Concentration     Interpretation   Follow-Up <16 - 50 ug/g     Normal  None >50 -120 ug/g     Borderline       Re-evaluate in 4-6 weeks     >120 ug/g     Abnormal         Repeat as clinically                                    indicated   Admission on 01/30/2022, Discharged on 01/30/2022  Component Date Value Ref Range Status   Preg Test, Ur 01/30/2022 NEGATIVE  NEGATIVE Final   Comment:        THE SENSITIVITY OF THIS METHODOLOGY IS >20 mIU/mL. Performed at [facility], [place] [address], [place], [place] [address]   Admission on 12/11/2021, Discharged on 12/11/2021  Component Date Value Ref Range Status   ABO/RH(D) 12/11/2021 A POS   Final   Antibody Screen 12/11/2021 NEG   Final   Sample Expiration 12/11/2021    Final                   Value:12/14/2021,2359 Performed at [facility], [place] [address] [place], [place] [address]    SURGICAL PATHOLOGY 12/11/2021    Final-Edited                   Value:SURGICAL PATHOLOGY CASE: [id number] PATIENT: [place] [facility] Report     Clinical History: ectopic pregnancy (cm)     FINAL MICROSCOPIC DIAGNOSIS:  A. FALLOPIAN TUBE, RIGHT WITH ECTOPIC, SALPINGECTOMY: -  Intraluminal chorionic villi and extravillous trophoblastic tissue (consistent with ectopic pregnancy)   GROSS DESCRIPTION:  The specimen is received fresh and consists of a disrupted fallopian tube, measuring 5.5 cm in length and ranging from 0.8 to 1.8 cm in diameter.  The serosal surface is pink-purple, hyperemic, with a possible defect noted at the distal end.  Sectioning reveals a moderately dilated lumen filled with tan-red softened material. Representative sections are submitted in 4 cassettes.  Craig Staggers 12/12/2021)    Final Diagnosis performed by  Thressa Sheller, MD.   Electronically signed 12/13/2021 Technical and / or Professional components performed at [facility]. [facility], [place] [address], [place], [place] [address].  Immuno                         histochemistry Technical component (if applicable) was performed at [facility]. [address], [place], [place], [place] [address].   IMMUNOHISTOCHEMISTRY DISCLAIMER (if applicable): Some of these immunohistochemical stains may have been developed and the performance characteristics determine by [facility]. Some may not have been cleared or approved by the U.S. Food and Drug Administration. The FDA has determined that such clearance or approval is not necessary. This test is used for clinical purposes. It should not be regarded as investigational or for research. This laboratory is certified under the [place] (CLIA-88) as qualified to perform high complexity clinical laboratory testing.  The controls stained appropriately.   Admission on 12/05/2021, Discharged on 12/05/2021  Component Date Value Ref Range Status   hCG, Beta Chain, Quant, S 12/05/2021 10,950 (H)  <5 mIU/mL Final   Comment:          GEST. AGE      CONC.  (mIU/mL)   <=1 WEEK        5 - 50     2 WEEKS  50 - 500     3 WEEKS       100 - 10,000     4 WEEKS     1,000 - 30,000     5 WEEKS     3,500 - 115,000   6-8 WEEKS     12,000 - 270,000    12 WEEKS     15,000 - 220,000        FEMALE AND NON-PREGNANT FEMALE:     LESS THAN 5 mIU/mL Performed at [facility], [place] [address] [place], [place] [address]    WBC 12/05/2021 10.4  4.0 - 10.5 K/uL Final   RBC 12/05/2021 4.49  3.87 - 5.11 MIL/uL Final   Hemoglobin 12/05/2021 12.7  12.0 - 15.0 g/dL Final   HCT 12/05/2021 38.7  36.0 - 46.0 % Final   MCV 12/05/2021 86.2  80.0 - 100.0 fL Final   MCH 12/05/2021 28.3  26.0 - 34.0 pg Final   MCHC 12/05/2021 32.8  30.0 - 36.0 g/dL Final    RDW 12/05/2021 14.0  11.5 - 15.5 % Final   Platelets 12/05/2021 371  150 - 400 K/uL Final   nRBC 12/05/2021 0.0  0.0 - 0.2 % Final   Neutrophils Relative % 12/05/2021 60  % Final   Neutro Abs 12/05/2021 6.1  1.7 - 7.7 K/uL Final   Lymphocytes Relative 12/05/2021 32  % Final   Lymphs Abs 12/05/2021 3.3  0.7 - 4.0 K/uL Final   Monocytes Relative 12/05/2021 5  % Final   Monocytes Absolute 12/05/2021 0.6  0.1 - 1.0 K/uL Final   Eosinophils Relative 12/05/2021 3  % Final   Eosinophils Absolute 12/05/2021 0.3  0.0 - 0.5 K/uL Final   Basophils Relative 12/05/2021 0  % Final   Basophils Absolute 12/05/2021 0.0  0.0 - 0.1 K/uL Final   Immature Granulocytes 12/05/2021 0  % Final   Abs Immature Granulocytes 12/05/2021 0.03  0.00 - 0.07 K/uL Final   Performed at [facility], [place] [address] [place], [place] [address]   Sodium 12/05/2021 137  135 - 145 mmol/L Final   Potassium 12/05/2021 3.3 (L)  3.5 - 5.1 mmol/L Final   Chloride 12/05/2021 107  98 - 111 mmol/L Final   CO2 12/05/2021 22  22 - 32 mmol/L Final   Glucose, Bld 12/05/2021 109 (H)  70 - 99 mg/dL Final   Glucose reference range applies only to samples taken after fasting for at least 8 hours.   BUN 12/05/2021 7  6 - 20 mg/dL Final   Creatinine, Ser 12/05/2021 0.71  0.44 - 1.00 mg/dL Final   Calcium 12/05/2021 8.9  8.9 - 10.3 mg/dL Final   Total Protein 12/05/2021 6.4 (L)  6.5 - 8.1 g/dL Final   Albumin 12/05/2021 3.3 (L)  3.5 - 5.0 g/dL Final   AST 12/05/2021 17  15 - 41 U/L Final   ALT 12/05/2021 13  0 - 44 U/L Final   Alkaline Phosphatase 12/05/2021 76  38 - 126 U/L Final   Total Bilirubin 12/05/2021 0.4  0.3 - 1.2 mg/dL Final   GFR, Estimated 12/05/2021 >60  >60 mL/min Final   Comment: (NOTE) Calculated using the CKD-EPI Creatinine Equation (2021)    Anion gap 12/05/2021 8  5 - 15 Final   Performed at [facility] [address] [place], [place] [address]   ABO/RH(D) 12/05/2021 A POS   Final   Antibody Screen  12/05/2021 NEG  Final   Sample Expiration 12/05/2021    Final                   Value:12/08/2021,2359 Performed at [facility] [address] [place], [place] [address]     Allergies: Bee venom, Pork allergy, Tramadol, Cinnamon, Nsaids, and Lithium  PTA Medications: (Not in a hospital admission)    Medical Decision Making  Patient made requirement for inpatient psychiatric hospitalization.    Recommendations  Based on my evaluation the patient does not appear to have an emergency medical condition.  Merrily Brittle, DO 05/23/22  6:54 PM

## 2022-05-23 NOTE — ED Notes (Signed)
Pt. Has been crying since we got here an hour ago she just stopped. She came to the nurses station saying sorry her Aunt died while she was in here and she needed to have a moment.
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
## 2022-05-23 NOTE — ED Notes (Signed)
Patient reminded that we need to obtain a urine sample. Patient voices understanding.

## 2022-05-23 NOTE — ED Notes (Signed)
Pt requesting to be discharged home stating she is Voluntary.  NP Evette Georges notified, states pt required to stay overnight and be reassessed in am or be IVCed.  Pt requested to speak with a pt advocate.  TTS Windell Hummingbird at bedside to speak with pt.

## 2022-05-23 NOTE — Discharge Instructions (Addendum)
Dear Barbara Orozco,   It has been a pleasure meeting you, please see instructions & resources below: Please make regular appointments with an outpatient psychiatrist and other doctors once you leave the hospital (if any, otherwise, please see below for resources).  Please make an appointment for an outpatient psychiatrist within 1-2 weeks. Please see your PCP or other outpatient providers for familial hypercholesterolemia workup.  LDL 220. Please refrain from any substances to prevent mood outburst or confusion.  Diagnosis: Acute stress disorder, borderline personality Borderline personality was mentioned, to let you know that you are not "bipolar".  Rather have symptoms that are due to childhood trauma  For therapy outside the hospital, please ask for these specific types of therapy: Trauma based CBT  To see which pharmacy near you is the CHEAPEST for certain medications, please use GoodRx. It is free website and has a free phone app.    Also consider looking at [facility] $4.00 or [facility]'s $7.00 prescription list. Both are free to view if googled "[facility] $4 prescription" and "public's $7 prescription". These are set prices, no insurance required. [facility]'s low cost medications: $4-$15 for 30days prescriptions or $10-$38 for 90days prescriptions  For issues with sleep, please use this free app for insomnia called CBT-I. Let your doctors and therapists know so they can help with extra tips and tricks or for guidance and accountability. NO ADDS on the app.      [place], [place] [address] [phone] OUTPATIENT Walk-in information: Please note, all walk-ins are first come & first serve, with limited number of availability.  Please note that to be eligible for services you must bring an ID or a piece of mail with your name and a [facility] address.  Therapist for therapy:  Monday & Wednesdays: Please ARRIVE at 7:15 AM for  registration Will START at 8:00 AM Every 1st & 2nd Friday of the month: Please ARRIVE at 10:15 AM for registration Will START at 1 PM - 5 PM  Psychiatrist for medication management: Monday - Friday:  Please ARRIVE at 7:15 AM for registration Will START at 8:00 AM  Regretfully, due to limited availability, please be aware that you may not been seen on the same day as walk-in. Please consider making an appoint or try again. Thank you for your patience and understanding.  EMERGENCY Suicide hotline: 988 Emergency: [place]:  [name], [name] [address]. [phone]     It is imperative that you follow through with treatment recommendations within 5-7 days from the day of discharge to mitigate further risk to your safety and overall mental well-being.  A list of outpatient therapy and psychiatric providers for medication management has been provided below to get you started in finding the right provider for you.  In case of an urgent emergency, you have the option of contacting the Mobile Crisis Unit with [facility] at [phone].           [name] [name] [place], [place], [address] [phone] phone (Medicare, Private insurance except Tricare, [facility], and [facility])  [facility] [address] [place], [place], [address] [phone] phone (Call to confirm insurance coverage) Consultation & Support Services     o Drop-In Hours: 1:00 PM to 5:00 PM     o Days: Monday - Thursday  Crisis Services (24/7)  [facility] [address] [place], [place], [address] [phone] phone [email or web address]  (to complete the intake form and upload ID and insurance cards)  [address] [place], [place], [address] [phone] phone ([place], [place], [facility], [place], [place], [place], UHC, [place], and certain [employer])  [employer] [facility] [place] [address] [place], [place], [address] [phone] phone (Sliding scale, Medicaid, call about other insurance coverage)  [facility] [address], [place], [place], [address] [phone] phone [phone] fax (Medicaid, Medicare, Self-pay, call about other insurance coverage)  [facility] (age 73+) [address] [place], [place], [address] [phone] hone [phone] fax ([place], [place], [place], [place], [place], [place], [place], [place], [place], [place], certain Medicare providers, [facility], [place])  [place], [place], [address] [phone] phone (Medicare, Medicaid, [place]ah Din[place], call about other insurance coverage)  [place] [address] [place], [place], [address] [phone] phone [phone] fax (Call [phone] to see what insurance is accepted) Norma Fredrickson, MD specializes in geropsych)  [facility] (medication management only) [address] [place], [place], [address] [phone] phone [phone] fax ([address], Medicare, [place], [place], [place])  [facility] [address] [place], [place], [address] [phone] phone [phone] fax ([address], [place], [place], [place], [place] [facility], [facility], [place])  [facility] [place],  [address] [phone] phone ([address], [place], [place], [place], [place], [place], [place], [facility]) Does genetic testing for medications; does transcranial magnetic stimulation along with basic services)  [facility] [address] [place], [place], [address] [phone]  phone (Call about insurance coverage)  [facility] [address] [place], [place], [address] [phone] phone [phone] fax (Call about insurance coverage)  [address] [place], [place], [address] [phone] phone [phone] fax (Call about insurance coverage)  [employer] [phone] [place], [address] [phone] phone (Medicaid, [place], [place], [place], [place])  [employer] [address] [place], [place], [address] [phone] phone (Medicaid, Medicare, call about other insurance coverage)  [facility] [address] [place], [place], [address] [phone] phone [phone] fax (Medicaid, Medicare, Tricare, call about other insurance coverage)  [facility] [place] [place], [place], [address] [phone] phone ([address] [place], [place], [place], [place], [place] types - Alliance, Stage manager, Partners, [place], [place] Choice, Healthy [place], [place], [place], and Complete)  [facility] [address], [place], [place], [address] [phone] phone Completely online treatment platform Contact: [place] Specialist [phone] phone [phone] fax ([address], [place], [place], [employer], Humana, [place], [place], [place], [place], [place])   Primary Care Providers  There are a wide array of primary care providers to choose from.  Here are a few to get you started.  You will, of course, need to verify insurance and if they are taking in new patients.  It is best to shop around for a provider that you feel comfortable.  You can also go to  [email or web address].nl that also includes an online scheduling tool.  [place], [place], [address] [phone] phone   Primary Care Resources   [facility] Locations           1. Family Medicine at [place], [place]  [address], [place], [place], [address]                     M-F 8:30am - 5:30pm            [phone] phone; [phone] fax             2. Family Medicine at [place], [place]. [address] [place], [place], [address]                     Tues, Weds, and Thurs 8am - [place]                 [phone] phone; [phone] fax             3. Family Medicine at [place], [place], [place], [address]                     M-F [place] phone; [phone] fax             4. Family Medicine at [facility], [place]. [address], [place], [place], [address]                     M-F 8:30am - 5:30pm            [phone] phone; [phone] fax                     Saturday's  9am-1pm (October through March)             5. Family Medicine at [facility], [address], [place], [place], [place], [address]
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
## 2022-05-23 NOTE — ED Notes (Signed)
Patient reports previous SI on May 21, 2022. Patient now reports intermittent SI with no plan. Patient is tearful and states " I don't need to be here". Patient is upset about her admission to Obs unit. Encouragement and support provided by Probation officer. Patient continues to have crying spells. Food given to patient. Patient is currently deny HI, AVH at this time. Will continue to monitor patient. Safety protocol in process.

## 2022-05-24 ENCOUNTER — Other Ambulatory Visit: Payer: Self-pay

## 2022-05-24 DIAGNOSIS — F445 Conversion disorder with seizures or convulsions: Secondary | ICD-10-CM

## 2022-05-24 DIAGNOSIS — F4321 Adjustment disorder with depressed mood: Secondary | ICD-10-CM

## 2022-05-24 DIAGNOSIS — K589 Irritable bowel syndrome without diarrhea: Secondary | ICD-10-CM

## 2022-05-24 DIAGNOSIS — F603 Borderline personality disorder: Secondary | ICD-10-CM

## 2022-05-24 DIAGNOSIS — N809 Endometriosis, unspecified: Secondary | ICD-10-CM

## 2022-05-24 DIAGNOSIS — K219 Gastro-esophageal reflux disease without esophagitis: Secondary | ICD-10-CM | POA: Diagnosis not present

## 2022-05-24 DIAGNOSIS — F1290 Cannabis use, unspecified, uncomplicated: Secondary | ICD-10-CM

## 2022-05-24 DIAGNOSIS — F4311 Post-traumatic stress disorder, acute: Secondary | ICD-10-CM | POA: Diagnosis not present

## 2022-05-24 DIAGNOSIS — R69 Illness, unspecified: Secondary | ICD-10-CM | POA: Diagnosis not present

## 2022-05-24 DIAGNOSIS — T1491XA Suicide attempt, initial encounter: Secondary | ICD-10-CM

## 2022-05-24 DIAGNOSIS — F1421 Cocaine dependence, in remission: Secondary | ICD-10-CM

## 2022-05-24 LAB — POC SARS CORONAVIRUS 2 AG: SARSCOV2ONAVIRUS 2 AG: NEGATIVE

## 2022-05-24 LAB — POCT PREGNANCY, URINE: Preg Test, Ur: NEGATIVE

## 2022-05-24 LAB — POCT URINE DRUG SCREEN - MANUAL ENTRY (I-SCREEN)
POC Amphetamine UR: NOT DETECTED
POC Buprenorphine (BUP): NOT DETECTED
POC Cocaine UR: NOT DETECTED
POC Marijuana UR: POSITIVE — AB
POC Methadone UR: NOT DETECTED
POC Methamphetamine UR: NOT DETECTED
POC Morphine: NOT DETECTED
POC Oxazepam (BZO): POSITIVE — AB
POC Oxycodone UR: POSITIVE — AB
POC Secobarbital (BAR): NOT DETECTED

## 2022-05-24 LAB — PREGNANCY, URINE: Preg Test, Ur: NEGATIVE

## 2022-05-24 MED ORDER — TOPIRAMATE 50 MG PO TABS: 50.0000 mg | ORAL_TABLET | Freq: Every day | ORAL | 0 refills | Status: DC

## 2022-05-24 MED ORDER — CHLORDIAZEPOXIDE HCL 25 MG PO CAPS: 25.0000 mg | ORAL_CAPSULE | Freq: Four times a day (QID) | ORAL | Status: DC | PRN

## 2022-05-24 MED ORDER — LOPERAMIDE HCL 2 MG PO CAPS: 2.0000 mg | ORAL_CAPSULE | ORAL | Status: DC | PRN

## 2022-05-24 MED ORDER — TOPIRAMATE 25 MG PO TABS
50.0000 mg | ORAL_TABLET | Freq: Every day | ORAL | Status: DC
  Administered 2022-05-24: 50 mg via ORAL
  Filled 2022-05-24: qty 2

## 2022-05-24 NOTE — BH Assessment (Addendum)
LCSW Progress Note   Per Merrily Brittle, DO, this pt does not require psychiatric hospitalization at this time.  Pt is psychiatrically cleared.  Discharge instructions include several resources for outpatient therapy and psychiatric services, the mobile crisis line, and a primary care providers.  EDP Merrily Brittle, DO, has been notified.  LCSW will also be speaking with pt and natural supports regarding PHP/IOP.  Omelia Blackwater, MSW, [place] [phone] or [phone]

## 2022-05-24 NOTE — ED Notes (Signed)
Patient resting quietly in bed with eyes closed. Respirations equal and unlabored, skin warm and dry, NAD. No change in assessment or acuity. Routine safety checks conducted according to facility protocol. Will continue to monitor for safety.

## 2022-05-24 NOTE — ED Notes (Signed)
Patient A&O x 4, ambulatory. Patient discharged in no acute distress. Patient denied SI/HI, A/VH upon discharge. Patient verbalized understanding of all discharge instructions explained by staff, to include follow up appointments, RX's and safety plan. Patient reported mood 10/10.  Pt belongings returned to patient from locker # 29 intact. Patient escorted to lobby via staff for transport to destination. Safety maintained.

## 2022-05-24 NOTE — ED Notes (Signed)
Pt sleeping in bed in adult obs. Respirations even and unlabored with no signs of acute distress noted. Will continue to monitor for safety.

## 2022-05-24 NOTE — ED Notes (Signed)
Pt refused vitals 

## 2022-05-24 NOTE — ED Notes (Signed)
Pt sleeping at present, no distress noted. Respirations even & unlabored.  Monitoring for safety.

## 2022-05-24 NOTE — ED Provider Notes (Signed)
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 instruction" and "AVS" for further details.   Disposition:  Home with family  Signed: Merrily Brittle, DO Psychiatry Resident, PGY-2 [facility] 05/24/2022, 11:12 AM

## 2022-05-24 NOTE — ED Notes (Signed)
Pt was given juice and a muffin .

## 2022-05-28 ENCOUNTER — Other Ambulatory Visit: Payer: Self-pay | Admitting: Internal Medicine

## 2022-05-28 DIAGNOSIS — Z8711 Personal history of peptic ulcer disease: Secondary | ICD-10-CM

## 2022-05-31 ENCOUNTER — Telehealth (HOSPITAL_COMMUNITY): Payer: Self-pay | Admitting: Licensed Clinical Social Worker

## 2022-06-07 ENCOUNTER — Encounter: Payer: Self-pay | Admitting: Internal Medicine

## 2022-06-07 ENCOUNTER — Ambulatory Visit (AMBULATORY_SURGERY_CENTER): Payer: 59 | Admitting: Internal Medicine

## 2022-06-07 VITALS — BP 112/73 | HR 77 | Temp 97.3°F | Resp 14 | Ht 67.0 in | Wt 278.0 lb

## 2022-06-07 DIAGNOSIS — Z8711 Personal history of peptic ulcer disease: Secondary | ICD-10-CM | POA: Diagnosis not present

## 2022-06-07 DIAGNOSIS — J45909 Unspecified asthma, uncomplicated: Secondary | ICD-10-CM | POA: Diagnosis not present

## 2022-06-07 DIAGNOSIS — K529 Noninfective gastroenteritis and colitis, unspecified: Secondary | ICD-10-CM | POA: Diagnosis not present

## 2022-06-07 DIAGNOSIS — R69 Illness, unspecified: Secondary | ICD-10-CM | POA: Diagnosis not present

## 2022-06-07 LAB — POCT URINE PREGNANCY: Preg Test, Ur: NEGATIVE

## 2022-06-07 MED ORDER — PANTOPRAZOLE SODIUM 40 MG PO TBEC: DELAYED_RELEASE_TABLET | ORAL | 3 refills | Status: DC

## 2022-06-07 MED ORDER — ONDANSETRON 4 MG PO TBDP
4.0000 mg | ORAL_TABLET | Freq: Three times a day (TID) | ORAL | 5 refills | Status: DC | PRN
Start: 2022-06-07 — End: 2023-01-15

## 2022-06-07 MED ORDER — SODIUM CHLORIDE 0.9 % IV SOLN: 500.0000 mL | Freq: Once | INTRAVENOUS | Status: DC

## 2022-06-07 NOTE — Progress Notes (Signed)
Called to room to assist during endoscopic procedure.  Patient ID and intended procedure confirmed with present staff. Received instructions for my participation in the procedure from the performing physician.   After 20 minutes or so, the pt calmed down a lot.  She allowed me to take out her IV.  She was appropriate during discharge instructions.

## 2022-06-07 NOTE — Progress Notes (Signed)
Patient arrived in recovery hysterically cyring and screaming.  Will not answer me if she has pain.  States for me not to come close due to the fact that she swings and hits the nurses.  Dr notified.

## 2022-06-07 NOTE — Patient Instructions (Addendum)
I did not see any signs of inflammatory bowel disease. I took biopsies as we discussed. Once I see the results will let you know - and communicate with Dr. Marcello Moores so hopefully will get seton removed.  I appreciate the opportunity to care for you. Gatha Mayer, MD, [facility]  Read all of the handouts given to you by your recovery room nurse.  YOU HAD AN ENDOSCOPIC PROCEDURE TODAY AT [facility]:   Refer to the procedure report that was given to you for any specific questions about what was found during the examination.  If the procedure report does not answer your questions, please call your gastroenterologist to clarify.  If you requested that your care partner not be given the details of your procedure findings, then the procedure report has been included in a sealed envelope for you to review at your convenience later.  YOU SHOULD EXPECT: Some feelings of bloating in the abdomen. Passage of more gas than usual.  Walking can help get rid of the air that was put into your GI tract during the procedure and reduce the bloating. If you had a lower endoscopy (such as a colonoscopy or flexible sigmoidoscopy) you may notice spotting of blood in your stool or on the toilet paper. If you underwent a bowel prep for your procedure, you may not have a normal bowel movement for a few days.  Please Note:  You might notice some irritation and congestion in your nose or some drainage.  This is from the oxygen used during your procedure.  There is no need for concern and it should clear up in a day or so.  SYMPTOMS TO REPORT IMMEDIATELY:  Following lower endoscopy (colonoscopy or flexible sigmoidoscopy):  Excessive amounts of blood in the stool  Significant tenderness or worsening of abdominal pains  Swelling of the abdomen that is new, acute  Fever of 100F or higher   For urgent or emergent issues, a gastroenterologist can be reached at any hour by calling [phone]. Do not use MyChart  messaging for urgent concerns.    DIET:  We do recommend a small meal at first, but then you may proceed to your regular diet.  Drink plenty of fluids but you should avoid alcoholic beverages for 24 hours.  ACTIVITY:  You should plan to take it easy for the rest of today and you should NOT DRIVE or use heavy machinery until tomorrow (because of the sedation medicines used during the test).    FOLLOW UP: Our staff will call the number listed on your records the next business day following your procedure.  We will call around 7:15- 8:00 am to check on you and address any questions or concerns that you may have regarding the information given to you following your procedure. If we do not reach you, we will leave a message.  If you develop any symptoms (ie: fever, flu-like symptoms, shortness of breath, cough etc.) before then, please call [phone].  If you test positive for Covid 19 in the 2 weeks post procedure, please call and report this information to [place].    If any biopsies were taken you will be contacted by phone or by letter within the next 1-3 weeks.  Please call us at [phone] if you have not heard about the biopsies in 3 weeks.    SIGNATURES/CONFIDENTIALITY: You and/or your care partner have signed paperwork which will be entered into your electronic medical record.  These signatures attest to the  fact that that the information above on your After Visit Summary has been reviewed and is understood.  Full responsibility of the confidentiality of this discharge information lies with you and/or your care-partner.

## 2022-06-07 NOTE — Progress Notes (Signed)
[facility] History and Physical   Primary Care Physician:  Pcp, No   Reason for Procedure:   diarrhea  Plan:    colonoscopy     HPI: Barbara Orozco is a 36 y.o. female w/ chronic diarrhea of unclear cause - here for colonoscopy to evaluate   In ED 7/19 w/ suicide attempt and these diagnoses   Acute posttraumatic stress disorder  Borderline personality disorder ([place])  Cannabis use disorder  Cocaine use disorder, moderate, in early remission ([place])  Endometriosis  Gastroesophageal reflux disease, unspecified whether esophagitis present  Grief associated with loss of fetus  Irritable bowel syndrome, unspecified type  Psychogenic nonepileptic seizure  Suicide attempt [facility])   Past Medical History:  Diagnosis Date   Acute posttraumatic stress disorder    Allergy    Anal fistula    Arm fracture    right and left   Asthma    Borderline personality disorder ([place]) 05/23/2022   Cannabis use disorder    Cocaine use disorder, moderate, in early remission ([place])    Complication of anesthesia    "wakes up angry for about 5 minutes"   Depression    Endometriosis    Endometriosis    Foot pain    right foot hurts since last night no reported injury per pt on 01-25-2022   GERD (gastroesophageal reflux disease)    Grief associated with loss of fetus    History of 2019 novel coronavirus disease (COVID-19) 04/29/2020   + Ag test - can see in Care everywhere   History of chicken pox    had 4 or 5 times as child   History of COVID-19 04/29/2020   mild all symptoms resolved   Irritable bowel    Leg fracture, left    Psychogenic nonepileptic seizure    last seizure 1 month ago per pt on 01-25-2022 saw dr Leta Jungling neurology   Seizure [facility])    last seizure 12-28-2021 per pt on 01-25-2022   Self-injurious behavior    Shingles    intermittent outbreaks   Ulcerative colitis ([place])    Wears glasses     Past Surgical History:  Procedure Laterality Date   APPENDECTOMY      BIOPSY  06/06/2020   Procedure: BIOPSY;  Surgeon: Gatha Mayer, MD;  Location: [facility];  Service: Endoscopy;;   COLONOSCOPY WITH PROPOFOL N/A 06/06/2020   Procedure: COLONOSCOPY WITH PROPOFOL;  Surgeon: Gatha Mayer, MD;  Location: [facility];  Service: Endoscopy;  Laterality: N/A;   DIAGNOSTIC LAPAROSCOPY WITH REMOVAL OF ECTOPIC PREGNANCY Right 12/11/2021   Procedure: LAPAROSCOPIC RIGHT SALPINGECTOMY WITH REMOVAL OF ECTOPIC PREGNANCY, lysis of adhesions;  Surgeon: Aloha Gell, MD;  Location: [place];  Service: Gynecology;  Laterality: Right;   ESOPHAGOGASTRODUODENOSCOPY (EGD) WITH PROPOFOL N/A 06/06/2020   Procedure: ESOPHAGOGASTRODUODENOSCOPY (EGD) WITH PROPOFOL;  Surgeon: Gatha Mayer, MD;  Location: [facility];  Service: Endoscopy;  Laterality: N/A;   HAND SURGERY Left    x3   PLACEMENT OF SETON N/A 01/30/2022   Procedure: PLACEMENT OF SETON;  Surgeon: Leighton Ruff, MD;  Location: [facility];  Service: General;  Laterality: N/A;   RECTAL EXAM UNDER ANESTHESIA N/A 01/30/2022   Procedure: ANAL EXAM UNDER ANESTHESIA;  Surgeon: Leighton Ruff, MD;  Location: [facility];  Service: General;  Laterality: N/A;    Prior to Admission medications   Medication Sig Start Date End Date Taking? Authorizing Provider  Multiple Vitamin (MULTIVITAMIN WITH MINERALS) TABS tablet Take 1 tablet by mouth daily.  [provider]  norethindrone (AYGESTIN) 5 MG tablet Take 10 mg by mouth every morning. Patient not taking: Reported on 06/07/2022    [provider]  ondansetron (ZOFRAN-ODT) 4 MG disintegrating tablet DISSOLVE 1 TABLET IN MOUTH EVERY 8 HOURS AS NEEDED FOR NAUSEA FOR VOMITING 05/28/22   Gatha Mayer, MD  pantoprazole (PROTONIX) 40 MG tablet TAKE 1 TABLET BY MOUTH TWICE DAILY BEFORE MEAL(S) 05/28/22   Gatha Mayer, MD  topiramate (TOPAMAX) 50 MG tablet Take 1 tablet (50 mg total) by mouth daily. Patient not taking: Reported on 06/07/2022 05/25/22 06/24/22  Merrily Brittle, DO     Current Outpatient Medications  Medication Sig Dispense Refill   Multiple Vitamin (MULTIVITAMIN WITH MINERALS) TABS tablet Take 1 tablet by mouth daily.     norethindrone (AYGESTIN) 5 MG tablet Take 10 mg by mouth every morning. (Patient not taking: Reported on 06/07/2022)     ondansetron (ZOFRAN-ODT) 4 MG disintegrating tablet DISSOLVE 1 TABLET IN MOUTH EVERY 8 HOURS AS NEEDED FOR NAUSEA FOR VOMITING 20 tablet 0   pantoprazole (PROTONIX) 40 MG tablet TAKE 1 TABLET BY MOUTH TWICE DAILY BEFORE MEAL(S) 60 tablet 2   topiramate (TOPAMAX) 50 MG tablet Take 1 tablet (50 mg total) by mouth daily. (Patient not taking: Reported on 06/07/2022) 30 tablet 0   Current Facility-Administered Medications  Medication Dose Route Frequency Provider Last Rate Last Admin   0.9 %  sodium chloride infusion  500 mL Intravenous Once Gatha Mayer, MD        Allergies as of 06/07/2022 - Review Complete 06/07/2022  Allergen Reaction Noted   Bee venom Anaphylaxis 05/09/2020   Pork allergy Anaphylaxis 05/09/2020   Tramadol Rash and Other (See Comments) 03/31/2017   Cinnamon Itching and Other (See Comments) 05/23/2022   Nsaids Other (See Comments) 05/26/2020   Lithium Rash 03/31/2017    Family History  Problem Relation Age of Onset   Colitis Mother    Ulcerative colitis Mother    Heart disease Mother    Other Mother    Colitis Father    Ulcerative colitis Father    Diabetes Father    Stomach cancer Paternal Aunt    Colitis Paternal Grandmother    Ulcerative colitis Paternal Grandmother    Colon cancer Neg Hx    Pancreatic cancer Neg Hx    Esophageal cancer Neg Hx     Social History   Socioeconomic History   Marital status: Divorced    Spouse name: Not on file   Number of children: Not on file   Years of education: Not on file   Highest education level: Not on file  Occupational History    Employer: [employer]  Tobacco Use   Smoking status: Former    Types: Cigarettes   Smokeless tobacco:  Never   Tobacco comments:    Social smoker quit 6-8 yrs ago per pt on 01-25-2022  Vaping Use   Vaping Use: Never used  Substance and Sexual Activity   Alcohol use: Not Currently    Comment: Ocaasionally   Drug use: Not Currently   Sexual activity: Yes  Other Topics Concern   Not on file  Social History Narrative   Engaged   occ EtOH, + Marijuana, no tobacco now - former   Right Handed   Drinks Caffeine    One Story Home    Social Determinants of Health   Financial Resource Strain: Not on file  Food Insecurity: Not on file  Transportation  Needs: Not on file  Physical Activity: Not on file  Stress: Not on file  Social Connections: Not on file  Intimate Partner Violence: Not on file    Review of Systems:  All other review of systems negative except as mentioned in the HPI.  Physical Exam: Vital signs BP 104/66   Pulse 71   Temp (!) 97.3 F (36.3 C)   Ht 5' 7"  (1.702 m)   Wt 278 lb (126.1 kg)   LMP 10/26/2021 Comment: R ectopic s/p salpingectomy 12-11-21  SpO2 98%   BMI 43.54 kg/m   General:   Alert,  Well-developed, well-nourished, pleasant and cooperative in NAD Lungs:  Clear throughout to auscultation.   Heart:  Regular rate and rhythm; no murmurs, clicks, rubs,  or gallops. Abdomen:  Soft, nontender and nondistended. Normal bowel sounds.   Neuro/Psych:  Alert and cooperative. Normal mood and affect. A and O x 3   @Analiza Cowger  Simonne Maffucci, MD, [facility] [phone] (pager) 06/07/2022 3:42 PM@

## 2022-06-07 NOTE — Progress Notes (Signed)
A/ox3, pleased with MAC, report to RN 

## 2022-06-07 NOTE — Op Note (Signed)
[facility] Patient Name: Barbara Orozco Procedure Date: 06/07/2022 3:46 PM MRN: [id number] Endoscopist: Gatha Mayer , MD Age: 36 Referring MD:  Date of Birth: 1986/05/25 Gender: Female Account #: [id number] Procedure:                Colonoscopy Indications:              Chronic diarrhea ? IBD Medicines:                Monitored Anesthesia Care Procedure:                Pre-Anesthesia Assessment:                           - Prior to the procedure, a History and Physical                            was performed, and patient medications and                            allergies were reviewed. The patient's tolerance of                            previous anesthesia was also reviewed. The risks                            and benefits of the procedure and the sedation                            options and risks were discussed with the patient.                            All questions were answered, and informed consent                            was obtained. Prior Anticoagulants: The patient has                            taken no previous anticoagulant or antiplatelet                            agents. ASA Grade Assessment: II - A patient with                            mild systemic disease. After reviewing the risks                            and benefits, the patient was deemed in                            satisfactory condition to undergo the procedure.                           After obtaining informed consent, the colonoscope  was passed under direct vision. Throughout the                            procedure, the patient's blood pressure, pulse, and                            oxygen saturations were monitored continuously. The                            CF [id number] #[id number] was introduced through the anus                            and advanced to the the terminal ileum, with                            identification of the appendiceal orifice  and IC                            valve. The colonoscopy was performed without                            difficulty. The patient tolerated the procedure                            well. The quality of the bowel preparation was                            excellent. The terminal ileum, ileocecal valve,                            appendiceal orifice, and rectum were photographed.                            The bowel preparation used was Miralax via split                            dose instruction. Scope In: 3:56:34 PM Scope Out: 4:09:17 PM Scope Withdrawal Time: 0 hours 9 minutes 12 seconds  Total Procedure Duration: 0 hours 12 minutes 43 seconds  Findings:                 The perianal examination was normal.                           Seton in place                           The terminal ileum appeared normal. Biopsies were                            taken with a cold forceps for histology.                            Verification of patient identification for the  specimen was done. Estimated blood loss was minimal.                           The colon (entire examined portion) appeared                            normal. Biopsies for histology were taken with a                            cold forceps from the right colon, left colon and                            rectum for evaluation of microscopic colitis.                            Verification of patient identification for the                            specimen was done. Estimated blood loss was minimal.                           The exam was otherwise without abnormality on                            direct and retroflexion views. Complications:            No immediate complications. Estimated Blood Loss:     Estimated blood loss was minimal. Impression:               - The examined portion of the ileum was normal.                            Biopsied.                           - The entire examined colon is  normal. Biopsied.                           - The examination was otherwise normal on direct                            and retroflexion views. Recommendation:           - Patient has a contact number available for                            emergencies. The signs and symptoms of potential                            delayed complications were discussed with the                            patient. Return to normal activities tomorrow.  Written discharge instructions were provided to the                            patient.                           - Resume previous diet.                           - Continue present medications.                           - Await pathology results. I do suspect this is IBS                            and not IBD and if so should be able to have seton                            removed by Dr. Marcello Moores.                           - No recommendation at this time regarding repeat                            colonoscopy. Gatha Mayer, MD 06/07/2022 4:25:22 PM This report has been signed electronically.

## 2022-06-08 ENCOUNTER — Telehealth: Payer: Self-pay | Admitting: *Deleted

## 2022-06-08 NOTE — Telephone Encounter (Signed)
Attempt for follow up phone call. No answer at number given.  Left message on voicemail.

## 2022-06-12 ENCOUNTER — Encounter: Payer: Self-pay | Admitting: Internal Medicine

## 2022-06-18 ENCOUNTER — Encounter: Payer: Self-pay | Admitting: Emergency Medicine

## 2022-06-18 ENCOUNTER — Ambulatory Visit
Admission: EM | Admit: 2022-06-18 | Discharge: 2022-06-18 | Disposition: A | Discharge status: Home / Self Care | Payer: 59 | Attending: Urgent Care | Admitting: Urgent Care

## 2022-06-18 ENCOUNTER — Ambulatory Visit (INDEPENDENT_AMBULATORY_CARE_PROVIDER_SITE_OTHER): Payer: 59

## 2022-06-18 DIAGNOSIS — R2241 Localized swelling, mass and lump, right lower limb: Secondary | ICD-10-CM | POA: Diagnosis not present

## 2022-06-18 DIAGNOSIS — N809 Endometriosis, unspecified: Secondary | ICD-10-CM | POA: Diagnosis not present

## 2022-06-18 DIAGNOSIS — S9031XA Contusion of right foot, initial encounter: Secondary | ICD-10-CM | POA: Diagnosis not present

## 2022-06-18 DIAGNOSIS — N912 Amenorrhea, unspecified: Secondary | ICD-10-CM | POA: Diagnosis not present

## 2022-06-18 DIAGNOSIS — M25571 Pain in right ankle and joints of right foot: Secondary | ICD-10-CM | POA: Diagnosis not present

## 2022-06-18 DIAGNOSIS — M79671 Pain in right foot: Secondary | ICD-10-CM | POA: Diagnosis not present

## 2022-06-18 DIAGNOSIS — M7731 Calcaneal spur, right foot: Secondary | ICD-10-CM | POA: Diagnosis not present

## 2022-06-18 LAB — POCT URINE PREGNANCY: Preg Test, Ur: NEGATIVE

## 2022-06-18 MED ORDER — PREDNISONE 20 MG PO TABS: ORAL_TABLET | ORAL | 0 refills | Status: DC

## 2022-06-18 MED ORDER — DICLOFENAC SODIUM 1 % EX GEL: 2.0000 g | Freq: Three times a day (TID) | CUTANEOUS | 0 refills | Status: DC

## 2022-06-18 NOTE — ED Triage Notes (Addendum)
Pt here with right top of foot pain x 2 weeks. Pt is unsure if she hit it during a seizure. Pt would like a pregnancy test. Has not had a period in 2 months.

## 2022-06-18 NOTE — ED Provider Notes (Signed)
Herr[place]   MRN: [id number] DOB: 10-11-1986  Subjective:   Barbara Orozco is a 36 y.o. female presenting for 2 week history of acute onset persistent and worsening right foot pain, swelling. Patient believes she had a seizure and hit her foot in the process. Has not used any NSAIDs for this as she cannot tolerate this.  Denies history of ulcerative colitis or Crohn's disease.  Was diagnosed with IBS ultimately.  She does have a history of endometriosis and would like a pregnancy test that she has not had a cycle in 2 months.  Previously she has done very well with prednisone.  No current facility-administered medications for this encounter.  Current Outpatient Medications:    Multiple Vitamin (MULTIVITAMIN WITH MINERALS) TABS tablet, Take 1 tablet by mouth daily., Disp: , Rfl:    norethindrone (AYGESTIN) 5 MG tablet, Take 10 mg by mouth every morning. (Patient not taking: Reported on 06/07/2022), Disp: , Rfl:    ondansetron (ZOFRAN-ODT) 4 MG disintegrating tablet, Take 1 tablet (4 mg total) by mouth every 8 (eight) hours as needed for nausea or vomiting., Disp: 30 tablet, Rfl: 5   pantoprazole (PROTONIX) 40 MG tablet, TAKE 1 TABLET BY MOUTH TWICE DAILY BEFORE MEAL(S), Disp: 180 tablet, Rfl: 3   topiramate (TOPAMAX) 50 MG tablet, Take 1 tablet (50 mg total) by mouth daily. (Patient not taking: Reported on 06/07/2022), Disp: 30 tablet, Rfl: 0   Allergies  Allergen Reactions   Bee Venom Anaphylaxis   Pork Allergy Anaphylaxis   Tramadol Rash and Other (See Comments)    Pt has Crohn's  Disease - Tramadol makes her stomach hurt/cramps   Cinnamon Itching and Other (See Comments)    Makes mouth burn and itch   Nsaids Other (See Comments)    Avoid due to Crohn's disease   Lithium Rash    Past Medical History:  Diagnosis Date   Acute posttraumatic stress disorder    Allergy    Anal fistula    Arm fracture    right and left   Asthma    Borderline personality  disorder ([place]) 05/23/2022   Cannabis use disorder    Cocaine use disorder, moderate, in early remission ([place])    Complication of anesthesia    "wakes up angry for about 5 minutes"   Depression    Endometriosis    Endometriosis    Foot pain    right foot hurts since last night no reported injury per pt on 01-25-2022   GERD (gastroesophageal reflux disease)    Grief associated with loss of fetus    History of 2019 novel coronavirus disease (COVID-19) 04/29/2020   + Ag test - can see in Care everywhere   History of chicken pox    had 4 or 5 times as child   History of COVID-19 04/29/2020   mild all symptoms resolved   IBS (irritable bowel syndrome)    Irritable bowel    Leg fracture, left    Psychogenic nonepileptic seizure    last seizure 1 month ago per pt on 01-25-2022 saw dr Leta Jungling neurology   Seizure [facility])    last seizure 12-28-2021 per pt on 01-25-2022   Self-injurious behavior    Shingles    intermittent outbreaks   Ulcerative colitis ([place])    Wears glasses      Past Surgical History:  Procedure Laterality Date   APPENDECTOMY     BIOPSY  06/06/2020   Procedure: BIOPSY;  Surgeon: Carlean Purl,  Ofilia Neas, MD;  Location: [facility];  Service: Endoscopy;;   COLONOSCOPY WITH PROPOFOL N/A 06/06/2020   Procedure: COLONOSCOPY WITH PROPOFOL;  Surgeon: Gatha Mayer, MD;  Location: [facility];  Service: Endoscopy;  Laterality: N/A;   DIAGNOSTIC LAPAROSCOPY WITH REMOVAL OF ECTOPIC PREGNANCY Right 12/11/2021   Procedure: LAPAROSCOPIC RIGHT SALPINGECTOMY WITH REMOVAL OF ECTOPIC PREGNANCY, lysis of adhesions;  Surgeon: Aloha Gell, MD;  Location: [place];  Service: Gynecology;  Laterality: Right;   ESOPHAGOGASTRODUODENOSCOPY (EGD) WITH PROPOFOL N/A 06/06/2020   Procedure: ESOPHAGOGASTRODUODENOSCOPY (EGD) WITH PROPOFOL;  Surgeon: Gatha Mayer, MD;  [facility]: [facility];  Service: Endoscopy;  Laterality: N/A;   HAND SURGERY Left    x3   PLACEMENT OF SETON N/A 01/30/2022   Procedure:  PLACEMENT OF SETON;  Surgeon: Leighton Ruff, MD;  Location: [facility];  Service: General;  Laterality: N/A;   RECTAL EXAM UNDER ANESTHESIA N/A 01/30/2022   Procedure: ANAL EXAM UNDER ANESTHESIA;  Surgeon: Leighton Ruff, MD;  Location: [facility];  Service: General;  Laterality: N/A;    Family History  Problem Relation Age of Onset   Colitis Mother    Ulcerative colitis Mother    Heart disease Mother    Other Mother    Colitis Father    Ulcerative colitis Father    Diabetes Father    Stomach cancer Paternal Aunt    Colitis Paternal Grandmother    Ulcerative colitis Paternal Grandmother    Colon cancer Neg Hx    Pancreatic cancer Neg Hx    Esophageal cancer Neg Hx     Social History   Tobacco Use   Smoking status: Former    Types: Cigarettes   Smokeless tobacco: Never   Tobacco comments:    Social smoker quit 6-8 yrs ago per pt on 01-25-2022  Vaping Use   Vaping Use: Never used  Substance Use Topics   Alcohol use: Not Currently    Comment: Ocaasionally   Drug use: Not Currently    ROS   Objective:   Vitals: BP 126/80 (BP Location: Right Arm)   Pulse 76   Temp 98.6 F (37 C)   Resp 16   LMP 10/26/2021 Comment: R ectopic s/p salpingectomy 12-11-21  SpO2 97%   Breastfeeding Unknown   Physical Exam Constitutional:      General: She is not in acute distress.    Appearance: Normal appearance. She is well-developed. She is not ill-appearing, toxic-appearing or diaphoretic.  HENT:     Head: Normocephalic and atraumatic.     Nose: Nose normal.     Mouth/Throat:     Mouth: Mucous membranes are moist.  Eyes:     General: No scleral icterus.       Right eye: No discharge.        Left eye: No discharge.     Extraocular Movements: Extraocular movements intact.  Cardiovascular:     Rate and Rhythm: Normal rate.  Pulmonary:     Effort: Pulmonary effort is normal.  Musculoskeletal:     Right foot: Decreased range of motion. Normal capillary refill. Swelling, tenderness and  bony tenderness present. No deformity, laceration or crepitus.       Feet:  Skin:    General: Skin is warm and dry.  Neurological:     General: No focal deficit present.     Mental Status: She is alert and oriented to person, place, and time.  Psychiatric:        Mood and Affect: Mood normal.  Behavior: Behavior normal.     Results for orders placed or performed during the hospital encounter of 06/18/22 (from the past 24 hour(s))  POCT urine pregnancy     Status: Normal   Collection Time: 06/18/22  4:48 PM  Result Value Ref Range   Preg Test, Ur Negative Negative   DG Foot Complete Right  Result Date: 06/18/2022 CLINICAL DATA:  pain x 2 weeks. Pt here with right top of foot pain x 2 weeks. Pt is unsure if she hit it during a seizure. EXAM: RIGHT FOOT COMPLETE - 3+ VIEW COMPARISON:  None Available. FINDINGS: No cortical erosion or destruction. There is no evidence of fracture or dislocation. Plantar and posterior calcaneal spur. There is no evidence of arthropathy or other focal bone abnormality. Soft tissues are unremarkable. IMPRESSION: No acute displaced fracture or dislocation. Electronically Signed   By: Iven Finn M.D.   On: 06/18/2022 17:05     Assessment and Plan :   PDMP not reviewed this encounter.  1. Contusion of right foot, initial encounter   2. Right foot pain   3. Localized swelling of right foot   4. Amenorrhea   5. Endometriosis     Recommended conservative management for right foot contusion.  Use RICE method, prednisone and diclofenac gel for pain and inflammation. Counseled patient on potential for adverse effects with medications prescribed/recommended today, ER and return-to-clinic precautions discussed, patient verbalized understanding.    Jaynee Eagles, PA-C 06/18/22 1736

## 2022-06-21 IMAGING — US US PELVIS COMPLETE TRANSABD/TRANSVAG W DUPLEX AND/OR DOPPLER
1 series · 14 of 25 positions shown · non-contrast
Comparison: Obstetrical ultrasound dated 04/25/2020.

CLINICAL DATA: Pelvic pain. Previous right salpingectomy. Menstrual
bleeding for the past 2 weeks.

EXAM:
TRANSABDOMINAL AND TRANSVAGINAL ULTRASOUND OF PELVIS
TECHNIQUE: Both transabdominal and transvaginal ultrasound examinations of the
pelvis were performed. Transabdominal technique was performed for
global imaging of the pelvis including uterus, ovaries, adnexal
regions, and pelvic cul-de-sac. It was necessary to proceed with
endovaginal exam following the transabdominal exam to visualize the
endometrium and better visualize the uterus and ovaries.

[Series 1: us pelvic complete w transvaginal and torsion righ · 66 acquisitions, 14 frames shown]
[im 1/66]
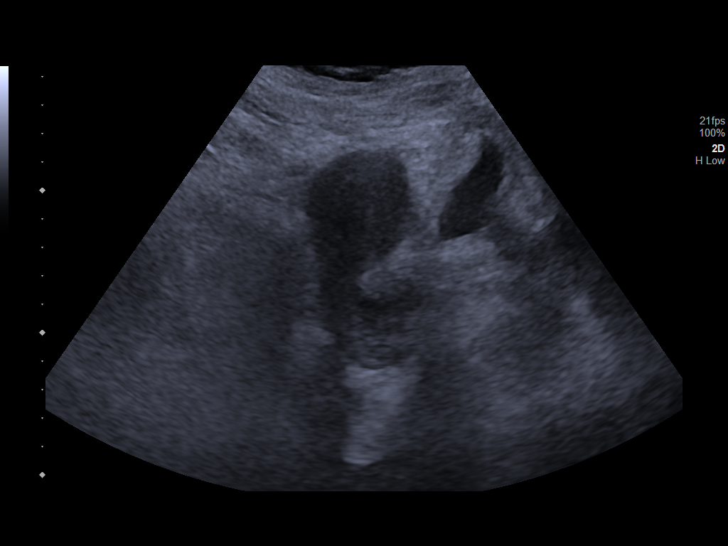
[im 6/66]
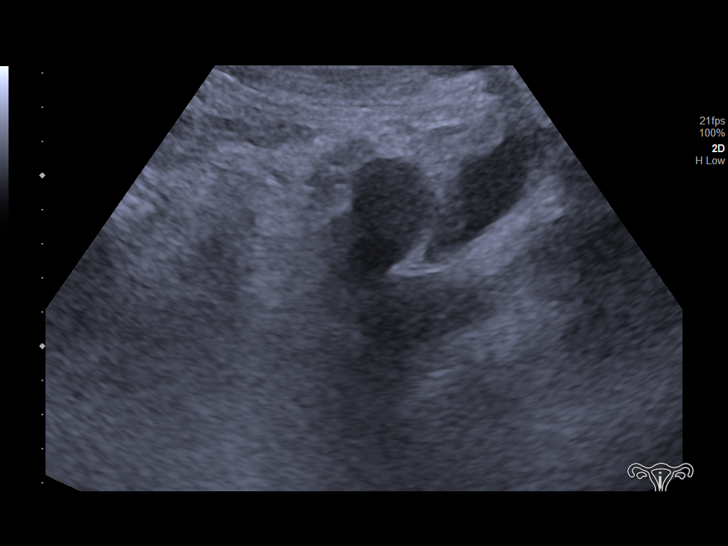
[im 11/66]
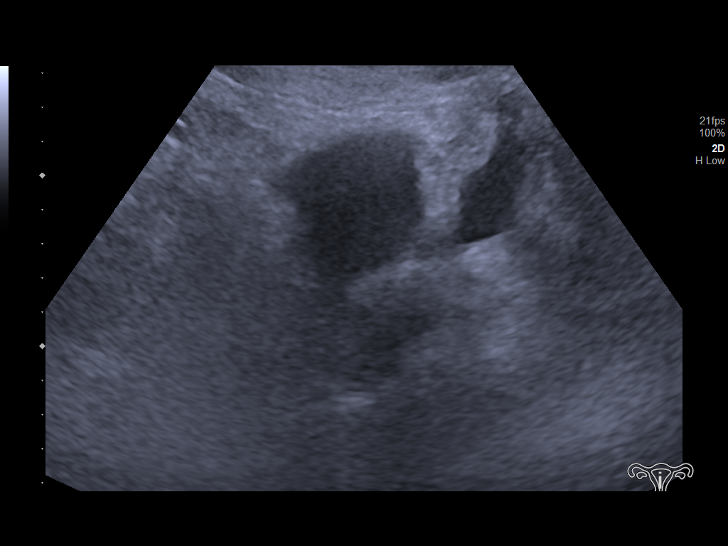
[im 17/66]
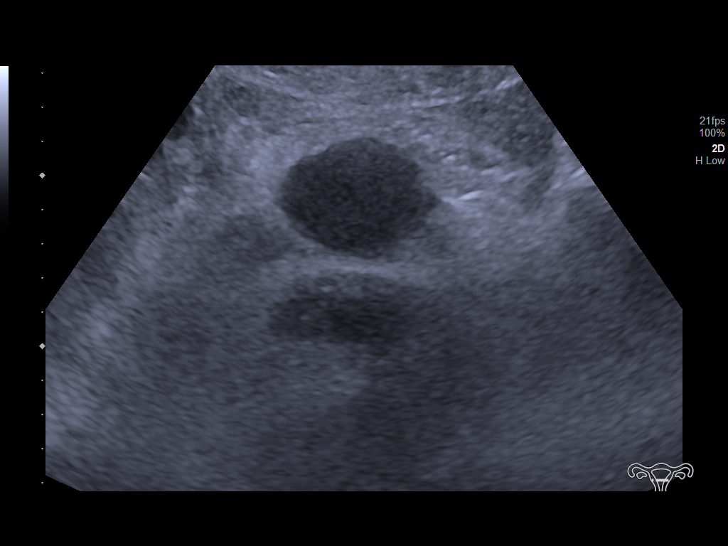
[im 22/66]
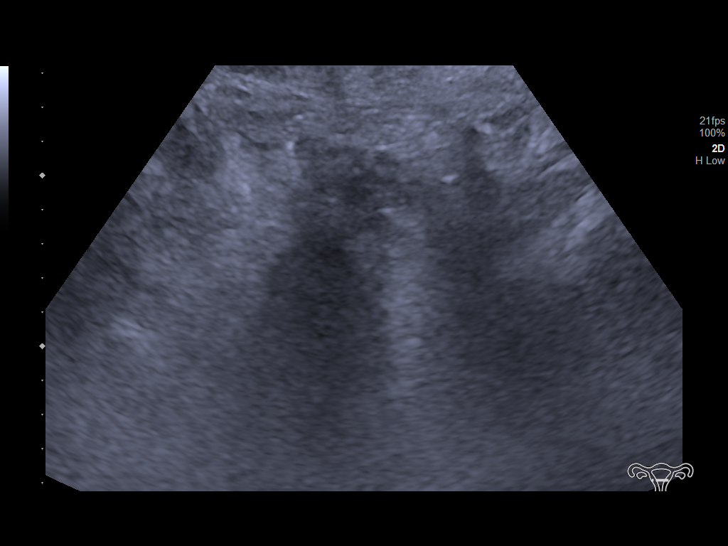
[im 25/66]
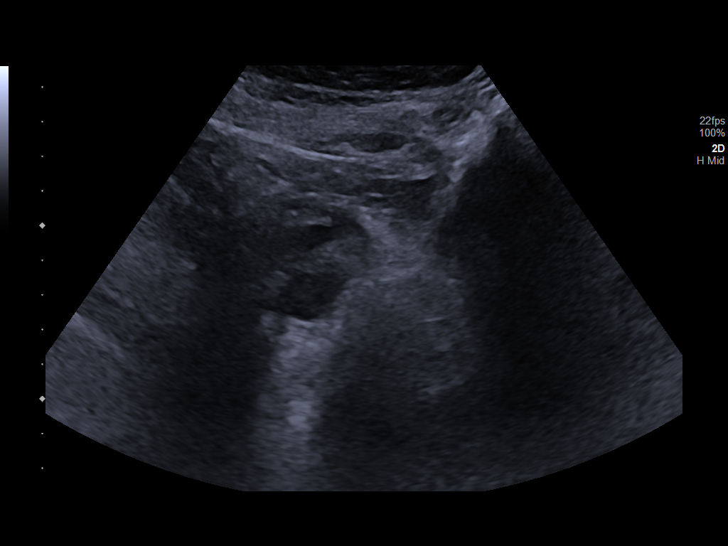
[im 30/66]
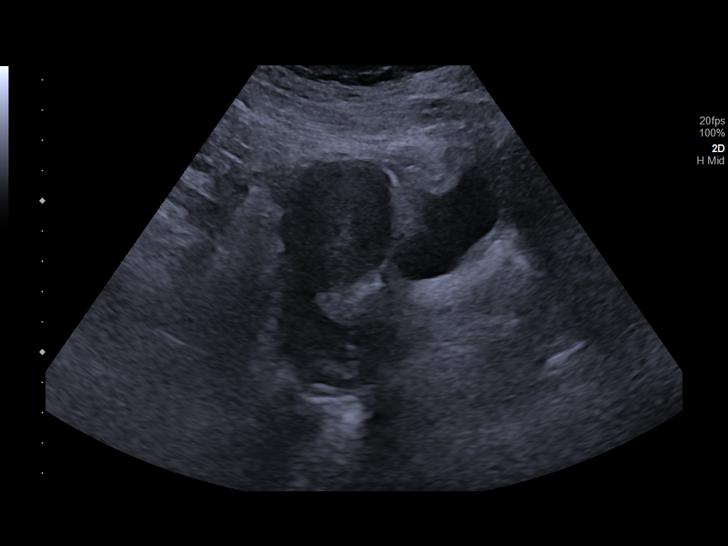
[im 36/66]
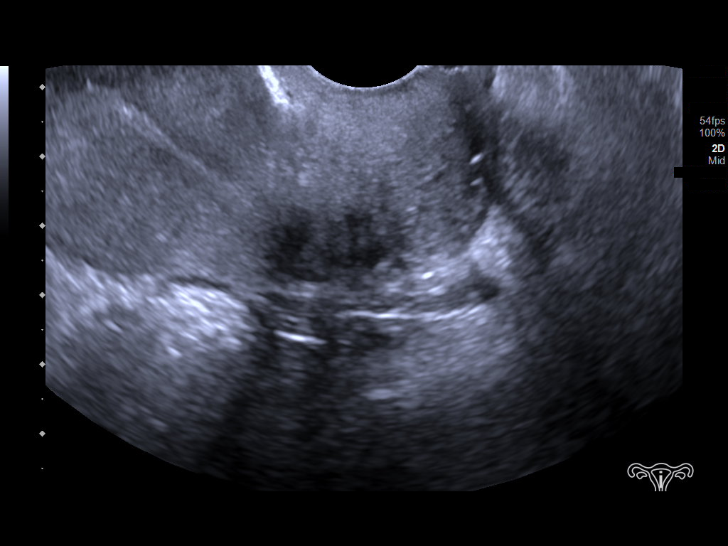
[im 41/66]
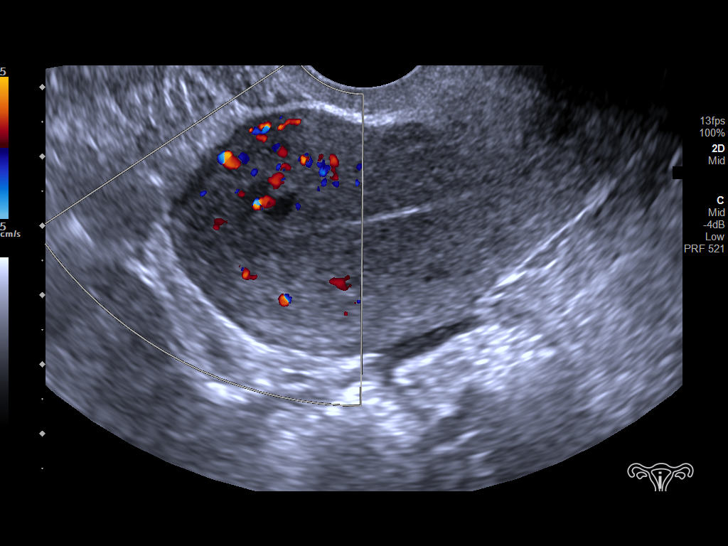
[im 44/66]
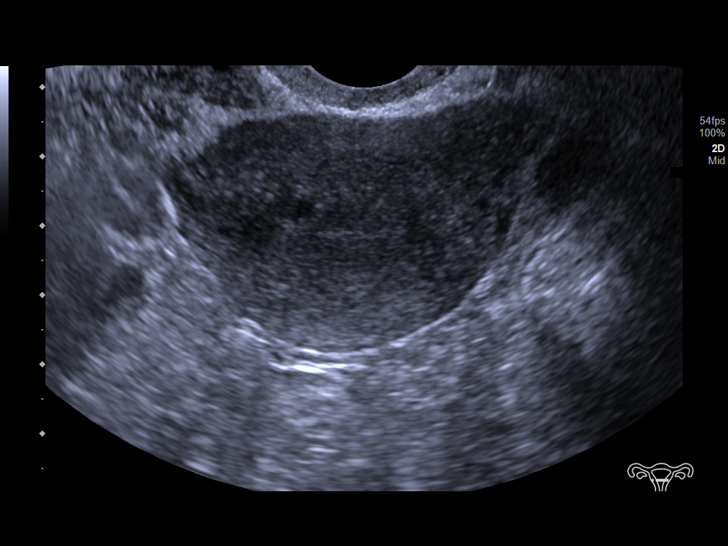
[im 49/66]
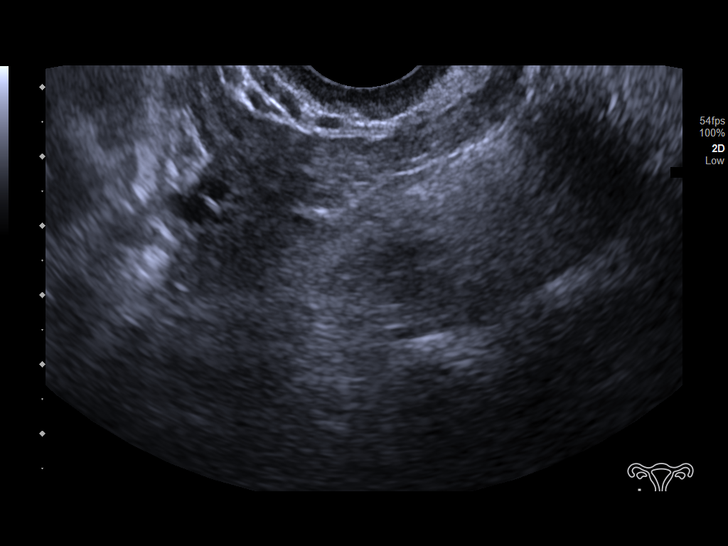
[im 55/66]
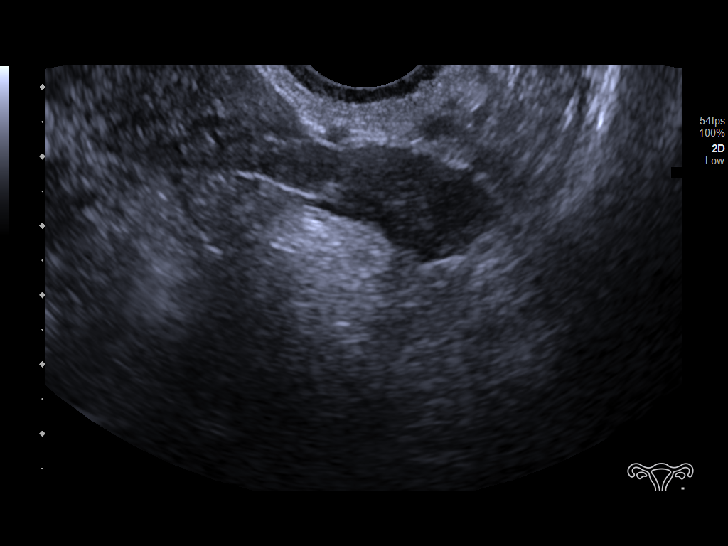
[im 60/66]
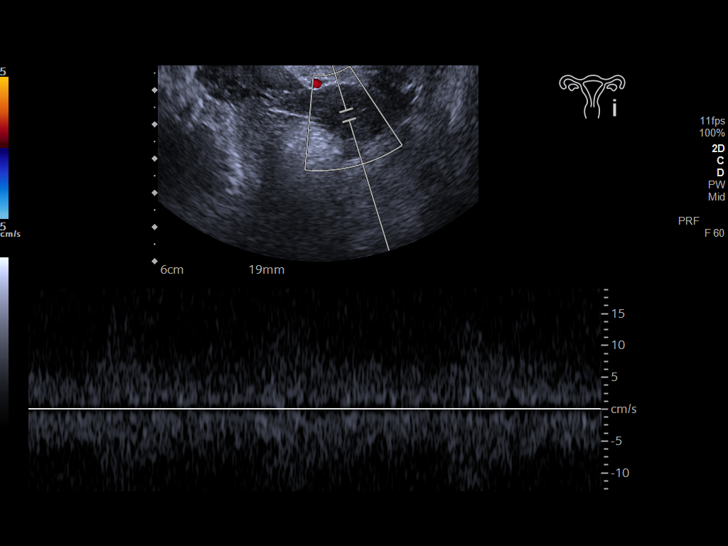
[im 66/66]
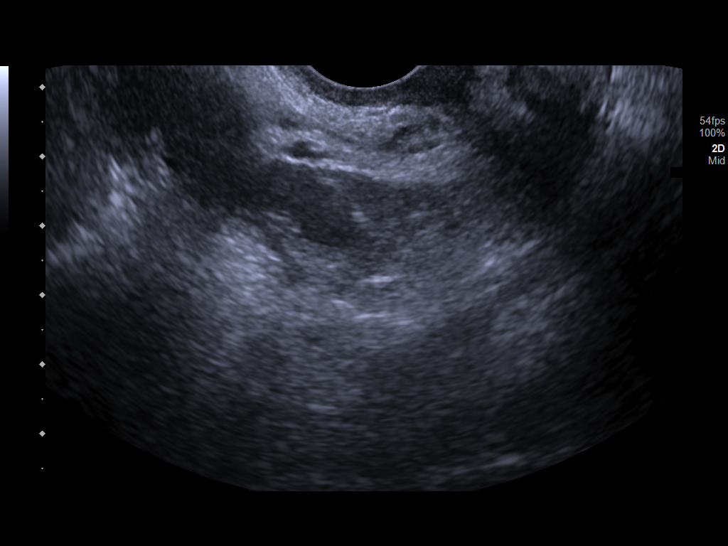

[14 of 25 positions shown; findings below may reference images not displayed]

FINDINGS: Uterus

Measurements: 8.8 x 4.8 x 3.4 cm = volume: 76 mL. Small fundal
subendometrial cyst measuring 3 mm in maximum diameter.

Endometrium

Thickness: 1 mm.  No focal abnormality visualized.

Right ovary

Measurements: 2.6 x 1.7 x 1.6 cm = volume: 3.8 mL. Normal
appearance/no adnexal mass.

Left ovary

Measurements: 2.2 x 1.7 x 1.4 cm = volume: 2.7 mL. Normal
appearance/no adnexal mass.

Other findings

No abnormal free fluid.
IMPRESSION: 3 mm subendometrial cyst.  Otherwise, normal examination.

## 2022-07-05 DIAGNOSIS — R69 Illness, unspecified: Secondary | ICD-10-CM | POA: Diagnosis not present

## 2022-07-12 ENCOUNTER — Encounter: Payer: BC Managed Care – PPO | Admitting: Family Medicine

## 2022-07-23 ENCOUNTER — Ambulatory Visit: Payer: Self-pay | Admitting: General Surgery

## 2022-07-23 DIAGNOSIS — K603 Anal fistula: Secondary | ICD-10-CM | POA: Diagnosis not present

## 2022-07-23 NOTE — H&P (Signed)
PROVIDER:  Monico Blitz, MD  MRN: [id number] DOB: January 28, 1986 DATE OF ENCOUNTER: 07/23/2022 Interval History:   She presented to the office with anal pain.  She was undergoing a work-up for possible IBD.  She states over the past 6 years she has had 7-8 perianal abscesses.  1 of these was lanced but the remaining resolved with antibiotics and spontaneous drainage.  She states all abscesses were in the same location except for 1.  She had been taking steroids for possible IBD and colonoscopy in 2021 showed no sign of inflammatory bowel disease and showed she was taken off the steroids at that time.  Since that time her symptoms have worsened.   I recommended an exam under anesthesia with seton placement and further evaluation by GI.  She underwent a colonoscopy over the summer which showed no sign of inflammatory disease  Past Medical History:  Diagnosis Date   Acute posttraumatic stress disorder    Allergy    Anal fistula    Arm fracture    right and left   Asthma    Borderline personality disorder ([place]) 05/23/2022   Cannabis use disorder    Cocaine use disorder, moderate, in early remission ([place])    Complication of anesthesia    "wakes up angry for about 5 minutes"   Depression    Endometriosis    Endometriosis    Foot pain    right foot hurts since last night no reported injury per pt on 01-25-2022   GERD (gastroesophageal reflux disease)    Grief associated with loss of fetus    History of 2019 novel coronavirus disease (COVID-19) 04/29/2020   + Ag test - can see in Care everywhere   History of chicken pox    had 4 or 5 times as child   History of COVID-19 04/29/2020   mild all symptoms resolved   IBS (irritable bowel syndrome)    Irritable bowel    Leg fracture, left    Psychogenic nonepileptic seizure    last seizure 1 month ago per pt on 01-25-2022 saw dr Leta Jungling neurology   Seizure [facility])    last seizure 12-28-2021 per pt on 01-25-2022   Self-injurious behavior     Shingles    intermittent outbreaks   Ulcerative colitis ([place])    Wears glasses    Past Surgical History:  Procedure Laterality Date   APPENDECTOMY     BIOPSY  06/06/2020   Procedure: BIOPSY;  Surgeon: Gatha Mayer, MD;  [facility];  Service: Endoscopy;;   COLONOSCOPY WITH PROPOFOL N/A 06/06/2020   Procedure: COLONOSCOPY WITH PROPOFOL;  Surgeon: Gatha Mayer, MD;  [facility];  Service: Endoscopy;  Laterality: N/A;   DIAGNOSTIC LAPAROSCOPY WITH REMOVAL OF ECTOPIC PREGNANCY Right 12/11/2021   Procedure: LAPAROSCOPIC RIGHT SALPINGECTOMY WITH REMOVAL OF ECTOPIC PREGNANCY, lysis of adhesions;  Surgeon: Aloha Gell, MD;  Location: [place];  Service: Gynecology;  Laterality: Right;   ESOPHAGOGASTRODUODENOSCOPY (EGD) WITH PROPOFOL N/A 06/06/2020   Procedure: ESOPHAGOGASTRODUODENOSCOPY (EGD) WITH PROPOFOL;  Surgeon: Gatha Mayer, MD;  [facility];  Service: Endoscopy;  Laterality: N/A;   HAND SURGERY Left    x3   PLACEMENT OF SETON N/A 01/30/2022   Procedure: PLACEMENT OF SETON;  Surgeon: Leighton Ruff, MD;  [facility];  Service: General;  Laterality: N/A;   RECTAL EXAM UNDER ANESTHESIA N/A 01/30/2022   Procedure: ANAL EXAM UNDER ANESTHESIA;  Surgeon: Leighton Ruff, MD;  [facility];  Service: General;  Laterality: N/A;   Family History  Problem Relation Age of Onset   Colitis Mother    Ulcerative colitis Mother    Heart disease Mother    Other Mother    Colitis Father    Ulcerative colitis Father    Diabetes Father    Stomach cancer Paternal Aunt    Colitis Paternal Grandmother    Ulcerative colitis Paternal Grandmother    Colon cancer Neg Hx    Pancreatic cancer Neg Hx    Esophageal cancer Neg Hx    Social History   Socioeconomic History   Marital status: Divorced    Spouse name: Not on file   Number of children: Not on file   Years of education: Not on file   Highest education level: Not on file  Occupational History     Employer: [employer]  Tobacco Use   Smoking status: Former    Types: Cigarettes   Smokeless tobacco: Never   Tobacco comments:    Social smoker quit 6-8 yrs ago per pt on 01-25-2022  Vaping Use   Vaping Use: Never used  Substance and Sexual Activity   Alcohol use: Not Currently    Comment: Ocaasionally   Drug use: Not Currently   Sexual activity: Yes  Other Topics Concern   Not on file  Social History Narrative   Engaged   occ EtOH, + Marijuana, no tobacco now - former   Right Handed   Drinks Caffeine    One Story Home    Social Determinants of Health   Financial Resource Strain: Not on file  Food Insecurity: Not on file  Transportation Needs: Not on file  Physical Activity: Not on file  Stress: Not on file  Social Connections: Not on file  Intimate Partner Violence: Not on file    Current Outpatient Medications:    diclofenac Sodium (VOLTAREN) 1 % GEL, Apply 2 g topically 3 (three) times daily., Disp: 100 g, Rfl: 0   Multiple Vitamin (MULTIVITAMIN WITH MINERALS) TABS tablet, Take 1 tablet by mouth daily., Disp: , Rfl:    norethindrone (AYGESTIN) 5 MG tablet, Take 10 mg by mouth every morning. (Patient not taking: Reported on 06/07/2022), Disp: , Rfl:    ondansetron (ZOFRAN-ODT) 4 MG disintegrating tablet, Take 1 tablet (4 mg total) by mouth every 8 (eight) hours as needed for nausea or vomiting., Disp: 30 tablet, Rfl: 5   pantoprazole (PROTONIX) 40 MG tablet, TAKE 1 TABLET BY MOUTH TWICE DAILY BEFORE MEAL(S), Disp: 180 tablet, Rfl: 3   predniSONE (DELTASONE) 20 MG tablet, Take 2 tablets daily with breakfast., Disp: 10 tablet, Rfl: 0   topiramate (TOPAMAX) 50 MG tablet, Take 1 tablet (50 mg total) by mouth daily. (Patient not taking: Reported on 06/07/2022), Disp: 30 tablet, Rfl: 0  Allergies  Allergen Reactions   Bee Venom Anaphylaxis   Pork Allergy Anaphylaxis   Tramadol Rash and Other (See Comments)    Pt has Crohn's  Disease - Tramadol makes her stomach hurt/cramps    Cinnamon Itching and Other (See Comments)    Makes mouth burn and itch   Nsaids Other (See Comments)    Avoid due to Crohn's disease   Lithium Rash    Review of Systems  All other systems reviewed and are negative.   Physical Examination:   Gen: NAD CV: RRR Lungs: TA Abd: soft Rectal: seton in place  Assessment and Plan:   Barbara Orozco is a 36 y.o. 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  She saw Dr Carlean Purl to discuss her diarrhea and bowel symptoms.  This was felt to be mainly IBS.  Colonoscopy off all steroids showed no sign of inflammatory disease.  We discussed the LIF T procedure in detail.  We discussed that she will need to be off work for 2 to 3 weeks to recover from surgery.  We discussed alternatives including removal of seton and fistulotomy.  Patient will plan on scheduling surgery at the beginning of the year due to needing several weeks of time off.  PROVIDER:  Monico Blitz, MD  MRN: [id number] DOB: 1986-05-22 DATE OF ENCOUNTER: 07/23/2022 Interval History:   She presented to the office with anal pain.  She was undergoing a work-up for possible IBD.  She states over the past 6 years she has had 7-8 perianal abscesses.  1 of these was lanced but the remaining resolved with antibiotics and spontaneous drainage.  She states all abscesses were in the same location except for 1.  She had been taking steroids for possible IBD and colonoscopy in 2021 showed no sign of inflammatory bowel disease and showed she was taken off the steroids at that time.  Since that time her symptoms have worsened.   I recommended an exam under anesthesia with seton placement and further evaluation by GI.  She underwent a colonoscopy over the summer which showed no sign of inflammatory disease Physical Examination:   Gen: NAD Abd: soft Incision: seton in place  Assessment and Plan:   Barbara Orozco is a 36 y.o. female who underwent seton placement for posterior  anal fistula.  She saw Dr Carlean Purl to discuss her diarrhea and bowel symptoms.  This was felt to be mainly IBS.  Colonoscopy off all steroids showed no sign of inflammatory disease.  We discussed the LIF T procedure in detail.  We discussed that she will need to be off work for 2 to 3 weeks to recover from surgery.  We discussed alternatives including removal of seton and fistulotomy.  Patient will plan on scheduling surgery at the beginning of the year due to needing several weeks of time off.  Risks include bleeding, infection, pain, recurrence and need for additional surgery.  Diagnoses and all orders for this visit:  Anal fistula      No follow-ups on file.   The plan was discussed in detail with the patient today, who expressed understanding.  The patient has my contact information, and understands to call me with any additional questions or concerns in the interval.  I would be happy to see the patient back sooner if the need arises.   Rosario Adie, MD Colon and Rectal Surgery [facility]   Diagnoses and all orders for this visit:  Anal fistula      No follow-ups on file.   The plan was discussed in detail with the patient today, who expressed understanding.  The patient has my contact information, and understands to call me with any additional questions or concerns in the interval.  I would be happy to see the patient back sooner if the need arises.   Rosario Adie, MD Colon and Rectal Surgery [facility]

## 2022-08-06 ENCOUNTER — Telehealth: Payer: Self-pay | Admitting: Licensed Clinical Social Worker

## 2022-08-06 NOTE — Patient Outreach (Signed)
  Care Coordination   08/06/2022 Name: Barbara Orozco MRN: [id number] DOB: 10-10-1986   Care Coordination Outreach Attempts:  An unsuccessful telephone outreach was attempted today to offer the patient information about available care coordination services as a benefit of their health plan.   Follow Up Plan:  Additional outreach attempts will be made to offer the patient care coordination information and services.   Encounter Outcome:  No Answer  Care Coordination Interventions Activated:  No   Care Coordination Interventions:  No, not indicated    Christa See, MSW, [email or web address] Phone [phone] 1:40 PM

## 2022-09-10 ENCOUNTER — Ambulatory Visit: Payer: Self-pay

## 2022-09-10 NOTE — Patient Outreach (Signed)
  Care Coordination   Initial Visit Note   09/10/2022 Name: Barbara Orozco MRN: [id number] DOB: 1986-08-21  Barbara Orozco is a 36 y.o. year old female who sees Pcp, No for primary care. I spoke with  Rene Paci by phone today.  What matters to the patients health and wellness today?  Patient declined to participate in today's call    Goals Addressed   None     SDOH assessments and interventions completed:  No     Care Coordination Interventions Activated:  No  Care Coordination Interventions:  No, not indicated   Follow up plan: No further intervention required.   Encounter Outcome:  Pt. Refused   Daneen Schick, BSW, CDP Social Worker, Certified Dementia Practitioner [place] [facility]  Care Coordination [phone]

## 2022-10-02 ENCOUNTER — Ambulatory Visit (INDEPENDENT_AMBULATORY_CARE_PROVIDER_SITE_OTHER): Payer: 59 | Admitting: Orthopaedic Surgery

## 2022-10-02 ENCOUNTER — Ambulatory Visit (INDEPENDENT_AMBULATORY_CARE_PROVIDER_SITE_OTHER): Payer: 59

## 2022-10-02 DIAGNOSIS — M79671 Pain in right foot: Secondary | ICD-10-CM

## 2022-10-02 DIAGNOSIS — M79672 Pain in left foot: Secondary | ICD-10-CM

## 2022-10-02 MED ORDER — ACETAMINOPHEN-CODEINE 300-30 MG PO TABS: 1.0000 | ORAL_TABLET | Freq: Three times a day (TID) | ORAL | 0 refills | Status: DC | PRN

## 2022-10-02 MED ORDER — DICLOFENAC SODIUM 1 % EX GEL: 2.0000 g | Freq: Four times a day (QID) | CUTANEOUS | 2 refills | Status: DC | PRN

## 2022-10-02 NOTE — Progress Notes (Signed)
Office Visit Note   Patient: Barbara Orozco           Date of Birth: 26-Mar-1986           MRN: [id number] Visit Date: 10/02/2022              Requested by: No referring provider defined for this encounter. PCP: Pcp, No   Assessment & Plan: Visit Diagnoses:  1. Bilateral foot pain     Plan: Impression is bilateral foot plantar fasciitis and midfoot arthritis with mild tarsal bossing.  At this point, we have recommended appropriate shoe wear with good support and appropriate space for the midfoot as well as full foot orthotics.  I discussed starting her on NSAIDs but due to her ulcerative colitis we will need to avoid these.  She will continue with topical Voltaren.  I have sent in Tylenol 3 as she has a history of seizures and I do not want to start her on tramadol for this reason.  She will also work on weight loss as I think this may be contributing to her symptoms.  She will follow-up with [place] as needed.  Call with concerns or questions.  Follow-Up Instructions: Return if symptoms worsen or fail to improve.   Orders:  Orders Placed This Encounter  Procedures   XR Foot Complete Left   Meds ordered this encounter  Medications   acetaminophen-codeine (TYLENOL #3) 300-30 MG tablet    Sig: Take 1 tablet by mouth every 8 (eight) hours as needed for moderate pain.    Dispense:  30 tablet    Refill:  0   diclofenac Sodium (VOLTAREN) 1 % GEL    Sig: Apply 2 g topically 4 (four) times daily as needed.    Dispense:  150 g    Refill:  2      Procedures: No procedures performed   Clinical Data: No additional findings.   Subjective: Chief Complaint  Patient presents with   Right Foot - Pain   Left Foot - Pain    HPI pleasant 36 year old female who comes in today with bilateral foot pain left greater than right.  Symptoms have been ongoing for the past 10 months and have progressively worsened.  She denies any injury or change in activity.  The pain she has is to the heel at  the plantar fascia insertion in addition to the dorsum of the midfoot.  She occasionally notes knots to the top of her feet.  Symptoms are worse first thing in the morning when she gets up to walk as well as when she is walking after she has been sitting for long period of time.  She has tried Tylenol and topical Voltaren without significant relief.  She has been wearing crocs over the past several months which seems to help her plantar fascia symptoms.  No previous stretching or use of orthotics.  No previous cortisone injections.  She does tell me she has ulcerative colitis and is unable to take NSAIDs.  Review of Systems as detailed in HPI.  All others reviewed and are negative.   Objective: Vital Signs: LMP 10/26/2021 Comment: R ectopic s/p salpingectomy 12-11-21  Physical Exam well-developed well-nourished female no acute distress.  Alert and oriented x3.  Ortho Exam bilateral foot exam reveals moderate tenderness at the heel just around the plantar fascia insertion.  She has mild tenderness to the dorsum of the midfoot.  No obvious deformity.  She can dorsiflex to neutral both sides.  She  is neurovascular intact distally.  Specialty Comments:  No specialty comments available.  Imaging: XR Foot Complete Left  Result Date: 10/02/2022 Mild midfoot arthritis with tarsal bossing    PMFS History: Patient Active Problem List   Diagnosis Date Noted   Borderline personality disorder ([place]) 05/23/2022   GERD (gastroesophageal reflux disease)    IBS (irritable bowel syndrome)    Psychogenic nonepileptic seizure    Endometriosis    Suicide attempt ([place])    Self-injurious behavior    Cannabis use disorder    Cocaine use disorder, moderate, in early remission ([place])    Grief associated with loss of fetus    Acute posttraumatic stress disorder    LUQ pain    Diarrhea    Family history of diabetes mellitus 01/22/2020   History of rectal abscess 01/22/2020   History of stomach ulcers  01/22/2020   Morbid obesity ([place]) 01/22/2020   Mild intermittent asthma without complication 65/68/1275   Past Medical History:  Diagnosis Date   Acute posttraumatic stress disorder    Allergy    Anal fistula    Arm fracture    right and left   Asthma    Borderline personality disorder ([place]) 05/23/2022   Cannabis use disorder    Cocaine use disorder, moderate, in early remission ([place])    Complication of anesthesia    "wakes up angry for about 5 minutes"   Depression    Endometriosis    Endometriosis    Foot pain    right foot hurts since last night no reported injury per pt on 01-25-2022   GERD (gastroesophageal reflux disease)    Grief associated with loss of fetus    History of 2019 novel coronavirus disease (COVID-19) 04/29/2020   + Ag test - can see in Care everywhere   History of chicken pox    had 4 or 5 times as child   History of COVID-19 04/29/2020   mild all symptoms resolved   IBS (irritable bowel syndrome)    Irritable bowel    Leg fracture, left    Psychogenic nonepileptic seizure    last seizure 1 month ago per pt on 01-25-2022 saw dr Leta Jungling neurology   Seizure [facility])    last seizure 12-28-2021 per pt on 01-25-2022   Self-injurious behavior    Shingles    intermittent outbreaks   Ulcerative colitis ([place])    Wears glasses     Family History  Problem Relation Age of Onset   Colitis Mother    Ulcerative colitis Mother    Heart disease Mother    Other Mother    Colitis Father    Ulcerative colitis Father    Diabetes Father    Stomach cancer Paternal Aunt    Colitis Paternal Grandmother    Ulcerative colitis Paternal Grandmother    Colon cancer Neg Hx    Pancreatic cancer Neg Hx    Esophageal cancer Neg Hx     Past Surgical History:  Procedure Laterality Date   APPENDECTOMY     BIOPSY  06/06/2020   Procedure: BIOPSY;  Surgeon: Gatha Mayer, MD;  [facility];  Service: Endoscopy;;   COLONOSCOPY WITH PROPOFOL N/A 06/06/2020   Procedure:  COLONOSCOPY WITH PROPOFOL;  Surgeon: Gatha Mayer, MD;  [facility];  Service: Endoscopy;  Laterality: N/A;   DIAGNOSTIC LAPAROSCOPY WITH REMOVAL OF ECTOPIC PREGNANCY Right 12/11/2021   Procedure: LAPAROSCOPIC RIGHT SALPINGECTOMY WITH REMOVAL OF ECTOPIC PREGNANCY, lysis of adhesions;  Surgeon: Pamala Hurry,  Claiborne Billings, MD;  Location: [place];  Service: Gynecology;  Laterality: Right;   ESOPHAGOGASTRODUODENOSCOPY (EGD) WITH PROPOFOL N/A 06/06/2020   Procedure: ESOPHAGOGASTRODUODENOSCOPY (EGD) WITH PROPOFOL;  Surgeon: Gatha Mayer, MD;  [facility];  Service: Endoscopy;  Laterality: N/A;   HAND SURGERY Left    x3   PLACEMENT OF SETON N/A 01/30/2022   Procedure: PLACEMENT OF SETON;  Surgeon: Leighton Ruff, MD;  [facility];  Service: General;  Laterality: N/A;   RECTAL EXAM UNDER ANESTHESIA N/A 01/30/2022   Procedure: ANAL EXAM UNDER ANESTHESIA;  Surgeon: Leighton Ruff, MD;  [facility];  Service: General;  Laterality: N/A;   Social History   Occupational History    Employer: [employer]  Tobacco Use   Smoking status: Former    Types: Cigarettes   Smokeless tobacco: Never   Tobacco comments:    Social smoker quit 6-8 yrs ago per pt on 01-25-2022  Vaping Use   Vaping Use: Never used  Substance and Sexual Activity   Alcohol use: Not Currently    Comment: Ocaasionally   Drug use: Not Currently   Sexual activity: Yes

## 2022-10-05 ENCOUNTER — Ambulatory Visit
Admission: EM | Admit: 2022-10-05 | Discharge: 2022-10-05 | Disposition: A | Discharge status: Home / Self Care | Payer: 59

## 2022-10-06 ENCOUNTER — Ambulatory Visit: Payer: 59

## 2022-10-19 ENCOUNTER — Telehealth: Payer: Self-pay | Admitting: Orthopaedic Surgery

## 2022-10-19 NOTE — Telephone Encounter (Signed)
I would give this a lot more time.  Continue with the stretching for plantar fasciitis.  If still hurting, could f/u with Dr. Erlinda Hong

## 2022-10-19 NOTE — Telephone Encounter (Signed)
Patient called. Says her pain isn't better. Would like to know what to do? Her call back number is [phone]

## 2022-10-22 ENCOUNTER — Other Ambulatory Visit: Payer: Self-pay | Admitting: Physician Assistant

## 2022-10-22 MED ORDER — LIDOCAINE 5 % EX PTCH: 1.0000 | MEDICATED_PATCH | CUTANEOUS | 0 refills | Status: DC

## 2022-10-22 NOTE — Telephone Encounter (Signed)
sent 

## 2022-10-22 NOTE — Telephone Encounter (Signed)
I can send in lidocaine patches if she wants to try those?

## 2022-10-22 NOTE — Telephone Encounter (Signed)
FYI I called patient and advised. She is going to try the lidocaine patches, but states that her feet hurt so bad that she does not want to walk on them. We scheduled appt for 10/31/2022 as this was the only open appt before the end of the year. She is going to cancel if she is doing better, but requested appointment prior to end of year in case it gets worse.

## 2022-10-31 ENCOUNTER — Encounter: Payer: Self-pay | Admitting: Orthopaedic Surgery

## 2022-10-31 ENCOUNTER — Ambulatory Visit (INDEPENDENT_AMBULATORY_CARE_PROVIDER_SITE_OTHER): Payer: No Typology Code available for payment source | Admitting: Orthopaedic Surgery

## 2022-10-31 DIAGNOSIS — M79671 Pain in right foot: Secondary | ICD-10-CM | POA: Diagnosis not present

## 2022-10-31 DIAGNOSIS — M79672 Pain in left foot: Secondary | ICD-10-CM | POA: Diagnosis not present

## 2022-10-31 NOTE — Progress Notes (Signed)
Office Visit Note   Patient: Barbara Orozco           Date of Birth: 1986-06-30           MRN: [id number] Visit Date: 10/31/2022              Requested by: No referring provider defined for this encounter. PCP: Pcp, No   Assessment & Plan: Visit Diagnoses:  1. Bilateral foot pain     Plan: Due to continued pain despite conservative measures I have recommended referral to Dr. Sharol Given for further evaluation.  In the meantime she will continue to do exercises and stay off her feet as much as she can.  I offered to take her out of work for a brief period but she declined.  Will see her back as needed.  Follow-Up Instructions: No follow-ups on file.   Orders:  No orders of the defined types were placed in this encounter.  No orders of the defined types were placed in this encounter.     Procedures: No procedures performed   Clinical Data: No additional findings.   Subjective: Chief Complaint  Patient presents with   Left Foot - Pain   Right Foot - Pain    HPI Barbara Orozco returns today for bilateral foot pain worse on the left.  She has been doing the plantar fascia stretches but mainly her pain is more dorsal.  The pain is worse in the evening.  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 Review of Systems   Objective: Vital Signs: LMP 10/26/2021 Comment: R ectopic s/p salpingectomy 12-11-21  Physical Exam  Ortho Exam Examination of bilateral feet is unchanged. Specialty Comments:  No specialty comments available.  Imaging: No results found.   PMFS History: Patient Active Problem List   Diagnosis Date Noted   Borderline personality disorder ([place]) 05/23/2022   GERD (gastroesophageal reflux disease)    IBS (irritable bowel syndrome)    Psychogenic nonepileptic seizure    Endometriosis    Suicide attempt ([place])    Self-injurious behavior    Cannabis use disorder    Cocaine use disorder, moderate, in early remission ([place])    Grief associated with loss of fetus     Acute posttraumatic stress disorder    LUQ pain    Diarrhea    Family history of diabetes mellitus 01/22/2020   History of rectal abscess 01/22/2020   History of stomach ulcers 01/22/2020   Morbid obesity ([place]) 01/22/2020   Mild intermittent asthma without complication 15/17/6160   Past Medical History:  Diagnosis Date   Acute posttraumatic stress disorder    Allergy    Anal fistula    Arm fracture    right and left   Asthma    Borderline personality disorder ([place]) 05/23/2022   Cannabis use disorder    Cocaine use disorder, moderate, in early remission ([place])    Complication of anesthesia    "wakes up angry for about 5 minutes"   Depression    Endometriosis    Endometriosis    Foot pain    right foot hurts since last night no reported injury per pt on 01-25-2022   GERD (gastroesophageal reflux disease)    Grief associated with loss of fetus    History of 2019 novel coronavirus disease (COVID-19) 04/29/2020   + Ag test - can see in Care everywhere   History of chicken pox    had 4 or 5 times as child   History  of COVID-19 04/29/2020   mild all symptoms resolved   IBS (irritable bowel syndrome)    Irritable bowel    Leg fracture, left    Psychogenic nonepileptic seizure    last seizure 1 month ago per pt on 01-25-2022 saw dr Leta Jungling neurology   Seizure [facility])    last seizure 12-28-2021 per pt on 01-25-2022   Self-injurious behavior    Shingles    intermittent outbreaks   Ulcerative colitis ([place])    Wears glasses     Family History  Problem Relation Age of Onset   Colitis Mother    Ulcerative colitis Mother    Heart disease Mother    Other Mother    Colitis Father    Ulcerative colitis Father    Diabetes Father    Stomach cancer Paternal Aunt    Colitis Paternal Grandmother    Ulcerative colitis Paternal Grandmother    Colon cancer Neg Hx    Pancreatic cancer Neg Hx    Esophageal cancer Neg Hx     Past Surgical History:  Procedure Laterality Date    APPENDECTOMY     BIOPSY  06/06/2020   Procedure: BIOPSY;  Surgeon: Gatha Mayer, MD;  [facility];  Service: Endoscopy;;   COLONOSCOPY WITH PROPOFOL N/A 06/06/2020   Procedure: COLONOSCOPY WITH PROPOFOL;  Surgeon: Gatha Mayer, MD;  [facility];  Service: Endoscopy;  Laterality: N/A;   DIAGNOSTIC LAPAROSCOPY WITH REMOVAL OF ECTOPIC PREGNANCY Right 12/11/2021   Procedure: LAPAROSCOPIC RIGHT SALPINGECTOMY WITH REMOVAL OF ECTOPIC PREGNANCY, lysis of adhesions;  Surgeon: Aloha Gell, MD;  Location: [place];  Service: Gynecology;  Laterality: Right;   ESOPHAGOGASTRODUODENOSCOPY (EGD) WITH PROPOFOL N/A 06/06/2020   Procedure: ESOPHAGOGASTRODUODENOSCOPY (EGD) WITH PROPOFOL;  Surgeon: Gatha Mayer, MD;  [facility];  Service: Endoscopy;  Laterality: N/A;   HAND SURGERY Left    x3   PLACEMENT OF SETON N/A 01/30/2022   Procedure: PLACEMENT OF SETON;  Surgeon: Leighton Ruff, MD;  [facility];  Service: General;  Laterality: N/A;   RECTAL EXAM UNDER ANESTHESIA N/A 01/30/2022   Procedure: ANAL EXAM UNDER ANESTHESIA;  Surgeon: Leighton Ruff, MD;  [facility];  Service: General;  Laterality: N/A;   Social History   Occupational History    Employer: [employer]  Tobacco Use   Smoking status: Former    Types: Cigarettes   Smokeless tobacco: Never   Tobacco comments:    Social smoker quit 6-8 yrs ago per pt on 01-25-2022  Vaping Use   Vaping Use: Never used  Substance and Sexual Activity   Alcohol use: Not Currently    Comment: Ocaasionally   Drug use: Not Currently   Sexual activity: Yes

## 2022-12-31 ENCOUNTER — Ambulatory Visit: Payer: Self-pay | Admitting: General Surgery

## 2022-12-31 DIAGNOSIS — R197 Diarrhea, unspecified: Secondary | ICD-10-CM | POA: Diagnosis not present

## 2022-12-31 NOTE — H&P (Signed)
PROVIDER:  Monico Blitz, MD  MRN: [id number] DOB: April 07, 1986 DATE OF ENCOUNTER: 12/31/2022 Interval History:   She presented to the office with anal pain.  She was undergoing a work-up for possible IBD.  She states over the past 6 years she has had 7-8 perianal abscesses.  1 of these was lanced but the remaining resolved with antibiotics and spontaneous drainage.  She states all abscesses were in the same location except for 1.  She had been taking steroids for possible IBD and colonoscopy in 2021 showed no sign of inflammatory bowel disease and showed she was taken off the steroids at that time.  Since that time her symptoms have worsened.   I recommended an exam under anesthesia with seton placement and further evaluation by GI.  She underwent a colonoscopy over the summer which showed no sign of inflammatory disease  Past Medical History:  Diagnosis Date   Acute posttraumatic stress disorder    Allergy    Anal fistula    Arm fracture    right and left   Asthma    Borderline personality disorder ([place]) 05/23/2022   Cannabis use disorder    Cocaine use disorder, moderate, in early remission ([place])    Complication of anesthesia    "wakes up angry for about 5 minutes"   Depression    Endometriosis    Endometriosis    Foot pain    right foot hurts since last night no reported injury per pt on 01-25-2022   GERD (gastroesophageal reflux disease)    Grief associated with loss of fetus    History of 2019 novel coronavirus disease (COVID-19) 04/29/2020   + Ag test - can see in Care everywhere   History of chicken pox    had 4 or 5 times as child   History of COVID-19 04/29/2020   mild all symptoms resolved   IBS (irritable bowel syndrome)    Irritable bowel    Leg fracture, left    Psychogenic nonepileptic seizure    last seizure 1 month ago per pt on 01-25-2022 saw dr Leta Jungling neurology   Seizure [facility])    last seizure 12-28-2021 per pt on 01-25-2022   Self-injurious behavior     Shingles    intermittent outbreaks   Ulcerative colitis ([place])    Wears glasses    Past Surgical History:  Procedure Laterality Date   APPENDECTOMY     BIOPSY  06/06/2020   Procedure: BIOPSY;  Surgeon: Gatha Mayer, MD;  [facility];  Service: Endoscopy;;   COLONOSCOPY WITH PROPOFOL N/A 06/06/2020   Procedure: COLONOSCOPY WITH PROPOFOL;  Surgeon: Gatha Mayer, MD;  [facility];  Service: Endoscopy;  Laterality: N/A;   DIAGNOSTIC LAPAROSCOPY WITH REMOVAL OF ECTOPIC PREGNANCY Right 12/11/2021   Procedure: LAPAROSCOPIC RIGHT SALPINGECTOMY WITH REMOVAL OF ECTOPIC PREGNANCY, lysis of adhesions;  Surgeon: Aloha Gell, MD;  Location: [place];  Service: Gynecology;  Laterality: Right;   ESOPHAGOGASTRODUODENOSCOPY (EGD) WITH PROPOFOL N/A 06/06/2020   Procedure: ESOPHAGOGASTRODUODENOSCOPY (EGD) WITH PROPOFOL;  Surgeon: Gatha Mayer, MD;  [facility];  Service: Endoscopy;  Laterality: N/A;   HAND SURGERY Left    x3   PLACEMENT OF SETON N/A 01/30/2022   Procedure: PLACEMENT OF SETON;  Surgeon: Leighton Ruff, MD;  [facility];  Service: General;  Laterality: N/A;   RECTAL EXAM UNDER ANESTHESIA N/A 01/30/2022   Procedure: ANAL EXAM UNDER ANESTHESIA;  Surgeon: Leighton Ruff, MD;  [facility];  Service:  General;  Laterality: N/A;   Family History  Problem Relation Age of Onset   Colitis Mother    Ulcerative colitis Mother    Heart disease Mother    Other Mother    Colitis Father    Ulcerative colitis Father    Diabetes Father    Stomach cancer Paternal Aunt    Colitis Paternal Grandmother    Ulcerative colitis Paternal Grandmother    Colon cancer Neg Hx    Pancreatic cancer Neg Hx    Esophageal cancer Neg Hx    Social History   Socioeconomic History   Marital status: Divorced    Spouse name: Not on file   Number of children: Not on file   Years of education: Not on file   Highest education level: Not on file  Occupational History     Employer: [employer]  Tobacco Use   Smoking status: Former    Types: Cigarettes   Smokeless tobacco: Never   Tobacco comments:    Social smoker quit 6-8 yrs ago per pt on 01-25-2022  Vaping Use   Vaping Use: Never used  Substance and Sexual Activity   Alcohol use: Not Currently    Comment: Ocaasionally   Drug use: Not Currently   Sexual activity: Yes  Other Topics Concern   Not on file  Social History Narrative   Engaged   occ EtOH, + Marijuana, no tobacco now - former   Right Handed   Drinks Caffeine    One Story Home    Social Determinants of Health   Financial Resource Strain: Not on file  Food Insecurity: Not on file  Transportation Needs: Not on file  Physical Activity: Not on file  Stress: Not on file  Social Connections: Not on file  Intimate Partner Violence: Not on file    Current Outpatient Medications:    acetaminophen-codeine (TYLENOL #3) 300-30 MG tablet, Take 1 tablet by mouth every 8 (eight) hours as needed for moderate pain., Disp: 30 tablet, Rfl: 0   diclofenac Sodium (VOLTAREN) 1 % GEL, Apply 2 g topically 3 (three) times daily., Disp: 100 g, Rfl: 0   diclofenac Sodium (VOLTAREN) 1 % GEL, Apply 2 g topically 4 (four) times daily as needed., Disp: 150 g, Rfl: 2   lidocaine (LIDODERM) 5 %, Place 1 patch onto the skin daily. Remove & Discard patch within 12 hours or as directed by MD, Disp: 30 patch, Rfl: 0   Multiple Vitamin (MULTIVITAMIN WITH MINERALS) TABS tablet, Take 1 tablet by mouth daily., Disp: , Rfl:    norethindrone (AYGESTIN) 5 MG tablet, Take 10 mg by mouth every morning., Disp: , Rfl:    ondansetron (ZOFRAN-ODT) 4 MG disintegrating tablet, Take 1 tablet (4 mg total) by mouth every 8 (eight) hours as needed for nausea or vomiting., Disp: 30 tablet, Rfl: 5   pantoprazole (PROTONIX) 40 MG tablet, TAKE 1 TABLET BY MOUTH TWICE DAILY BEFORE MEAL(S), Disp: 180 tablet, Rfl: 3   predniSONE (DELTASONE) 20 MG tablet, Take 2 tablets daily with breakfast., Disp:  10 tablet, Rfl: 0   topiramate (TOPAMAX) 50 MG tablet, Take 1 tablet (50 mg total) by mouth daily. (Patient not taking: Reported on 06/07/2022), Disp: 30 tablet, Rfl: 0 Allergies  Allergen Reactions   Bee Venom Anaphylaxis   Pork Allergy Anaphylaxis   Tramadol Rash and Other (See Comments)    Pt has Crohn's  Disease - Tramadol makes her stomach hurt/cramps   Cinnamon Itching and Other (See Comments)  Makes mouth burn and itch   Nsaids Other (See Comments)    Avoid due to Crohn's disease   Lithium Rash   Review of Systems - Negative except as stated above   Physical Examination:   Gen: NAD Abd: soft Rectal: seton in place  Assessment and Plan:   Barbara Orozco is a 37 y.o. 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  She saw Dr Carlean Purl to discuss her diarrhea and bowel symptoms.  This was felt to be mainly IBS.  Colonoscopy off all steroids showed no sign of inflammatory disease.  We discussed the LIFT procedure in detail.  We discussed that she will need to be off work for 2 to 3 weeks to recover from surgery.  We discussed alternatives including removal of seton, fistula plug and fistulotomy.  Patient would like to schedule surgery.  She understands that her recurrence rate will be higher if her diarrhea is not under control.  There are no diagnoses linked to this encounter.     No follow-ups on file.   The plan was discussed in detail with the patient today, who expressed understanding.  The patient has my contact information, and understands to call me with any additional questions or concerns in the interval.  I would be happy to see the patient back sooner if the need arises.   Rosario Adie, MD Colon and Rectal Surgery [facility]

## 2023-01-02 ENCOUNTER — Telehealth: Payer: Self-pay

## 2023-01-02 NOTE — Telephone Encounter (Signed)
Cholestyramine a good idea

## 2023-01-02 NOTE — Telephone Encounter (Signed)
Left her a detailed message to try the Cholestryramine per Dr Carlean Purl.

## 2023-01-02 NOTE — Telephone Encounter (Signed)
Barbara Orozco is on to see Dr Carlean Purl 01/15/2023. She wants me to ask Dr Carlean Purl if its okay for her to take the Cholestryramine that Dr Marcello Moores rx'ed recently. She is also going to email me her FMLA paperwork to be completed.

## 2023-01-02 NOTE — Telephone Encounter (Signed)
-----   Message from Gatha Mayer, MD sent at 01/01/2023  4:57 PM EST ----- I need to see her in the office - there are some other things we can try, I think.  PJ - please find hear spot to see me - can be an RN spot or banding or 1130 or 350 ----- Message ----- From: Leighton Ruff, MD Sent: [id number]  11:42 AM EST To: Gatha Mayer, MD  [facility] [place].  Any other thoughts on getting her diarrhea under control for fistula surgery?  She says lomotil and imodium didn't work.  Elmo Putt

## 2023-01-15 ENCOUNTER — Other Ambulatory Visit: Payer: No Typology Code available for payment source

## 2023-01-15 ENCOUNTER — Encounter: Payer: Self-pay | Admitting: Internal Medicine

## 2023-01-15 ENCOUNTER — Ambulatory Visit (INDEPENDENT_AMBULATORY_CARE_PROVIDER_SITE_OTHER): Payer: No Typology Code available for payment source | Admitting: Internal Medicine

## 2023-01-15 VITALS — BP 124/70 | HR 75 | Ht 67.0 in | Wt 273.0 lb

## 2023-01-15 DIAGNOSIS — K529 Noninfective gastroenteritis and colitis, unspecified: Secondary | ICD-10-CM | POA: Diagnosis not present

## 2023-01-15 DIAGNOSIS — K219 Gastro-esophageal reflux disease without esophagitis: Secondary | ICD-10-CM

## 2023-01-15 DIAGNOSIS — Z8711 Personal history of peptic ulcer disease: Secondary | ICD-10-CM

## 2023-01-15 DIAGNOSIS — K580 Irritable bowel syndrome with diarrhea: Secondary | ICD-10-CM | POA: Diagnosis not present

## 2023-01-15 DIAGNOSIS — K603 Anal fistula: Secondary | ICD-10-CM

## 2023-01-15 MED ORDER — ONDANSETRON 4 MG PO TBDP: 4.0000 mg | ORAL_TABLET | Freq: Three times a day (TID) | ORAL | 5 refills | Status: DC | PRN

## 2023-01-15 MED ORDER — PANTOPRAZOLE SODIUM 40 MG PO TBEC: DELAYED_RELEASE_TABLET | ORAL | 3 refills | Status: DC

## 2023-01-15 NOTE — Progress Notes (Signed)
Barbara Orozco 37 y.o. [birth year] [id number]  Assessment & Plan:   Encounter Diagnoses  Name Primary?   Chronic diarrhea Yes   Irritable bowel syndrome with diarrhea    Anal fistula    Gastroesophageal reflux disease, unspecified whether esophagitis present    History of stomach ulcers    Look for the possibility of celiac disease with tissue transglutaminase antibody and IgA level.  I still strongly favor IBS-diarrhea predominant.  She will try the cholestyramine prescribed by Dr. Marcello Moores.  Pending the celiac studies and a trial of cholestyramine, consider alosetron.  I will contact her with results and plan follow-up.  Side effects of alosetron reviewed with the patient.  Copy to Dr. Leighton Ruff  Subjective:  GI summary History when she presented to [facility] 2021 Chronic diarrhea, previously told she has inflammatory bowel disease Chronic abdominal pain Self-reported history of multiple perianal abscesses   Treatment with steroids prior to seeing [facility], intermittently.  Also empirically tried when we met her given her history of ulcerative colitis.  [place] evaluation  CT abdomen pelvis with contrast 02/25/2020 "diarrheal state" no true pathology found  EGD 06/06/2020 normal except J-shaped stomach Colonoscopy 06/06/2020 normal terminal ileum normal colon, perianal nodule A. ILEUM, TERMINAL, BIOPSY:  -  Benign small bowel mucosa with lymphoid aggregates  -  No acute inflammation, granulomas or malignancy identified   B. COLON, RIGHT, BIOPSY:  -  Benign colonic mucosa  -  No active inflammation or evidence of microscopic colitis  -  No high grade dysplasia or malignancy identified   C. COLON, LEFT, BIOPSY:  -  Benign colonic mucosa  -  No active inflammation or evidence of microscopic colitis  -  No high grade dysplasia or malignancy identified   March 2023 midline posterior fistula treated with seton at exam under anesthesia by Dr. Marcello Moores  Repeat  colonoscopy 06/07/2022 given persistent symptoms after seton placement, normal again including random biopsies.  Symptomatic treatments have included ondansetron, dicyclomine, Lomotil and loperamide.  Prednisone has not worked either.  Cholestyramine prescribed February 2024 by Dr. Marcello Moores   Chief Complaint: Diarrhea  HPI Barbara Orozco is a 37 year old white woman with a long history of chronic diarrhea originally attributed to inflammatory bowel disease but that has not been substantiated on imaging or endoscopic evaluation.  Colonoscopy last year in August demonstrated normal looking mucosa and normal biopsies.  She had had perianal abscesses and an anal fistula and has a seton in place but it cannot be removed at this point until diarrhea is under control due to potential complications and poor healing with the diarrhea.  She followed up with Dr. Marcello Moores recently and cholestyramine was prescribed but the patient did not realize that had been done and only recently purchased it and has not yet started.  She continues with watery diarrhea problems and crampy abdominal pain as she has had for many years.  She is asking for FMLA papers to be filled out again.  She needs frequent bathroom breaks.  Over the years she has tried loperamide, Lomotil, dicyclomine.  Ondansetron helps nausea.  Lomotil would control the diarrhea but only while taking it.  She does not use significant amounts of artificial sweeteners.  No dietary triggers have ever been elicited.  A fecal calprotectin was normal last year.  CT scans have not showed any small bowel inflammation.    Wt Readings from Last 3 Encounters:  01/15/23 273 lb (123.8 kg)  06/07/22 278 lb (126.1 kg)  04/01/22 275  lb (124.7 kg)     Allergies  Allergen Reactions   Bee Venom Anaphylaxis   Pork Allergy Anaphylaxis   Tramadol Rash and Other (See Comments)    Pt has Crohn's  Disease - Tramadol makes her stomach hurt/cramps   Cinnamon Itching and Other (See  Comments)    Makes mouth burn and itch   Nsaids Other (See Comments)    Avoid due to Crohn's disease   Lithium Rash   Current Meds  Medication Sig   ondansetron (ZOFRAN-ODT) 4 MG disintegrating tablet Take 1 tablet (4 mg total) by mouth every 8 (eight) hours as needed for nausea or vomiting.   pantoprazole (PROTONIX) 40 MG tablet TAKE 1 TABLET BY MOUTH TWICE DAILY BEFORE MEAL(S)   Past Medical History:  Diagnosis Date   Acute posttraumatic stress disorder    Allergy    Anal fistula    Arm fracture    right and left   Asthma    Borderline personality disorder ([place]) 05/23/2022   Cannabis use disorder    Cocaine use disorder, moderate, in early remission ([place])    Complication of anesthesia    "wakes up angry for about 5 minutes"   Depression    Endometriosis    Endometriosis    Foot pain    right foot hurts since last night no reported injury per pt on 01-25-2022   GERD (gastroesophageal reflux disease)    Grief associated with loss of fetus    History of 2019 novel coronavirus disease (COVID-19) 04/29/2020   + Ag test - can see in Care everywhere   History of chicken pox    had 4 or 5 times as child   History of COVID-19 04/29/2020   mild all symptoms resolved   IBS (irritable bowel syndrome)    Irritable bowel    Leg fracture, left    Psychogenic nonepileptic seizure    last seizure 1 month ago per pt on 01-25-2022 saw dr Leta Jungling neurology   Seizure [facility])    last seizure 12-28-2021 per pt on 01-25-2022   Self-injurious behavior    Shingles    intermittent outbreaks   Ulcerative colitis ([place])    Wears glasses    Past Surgical History:  Procedure Laterality Date   APPENDECTOMY     BIOPSY  06/06/2020   Procedure: BIOPSY;  Surgeon: Gatha Mayer, MD;  Location: [facility];  Service: Endoscopy;;   COLONOSCOPY WITH PROPOFOL N/A 06/06/2020   Procedure: COLONOSCOPY WITH PROPOFOL;  Surgeon: Gatha Mayer, MD;  Location: [facility];  Service: Endoscopy;  Laterality: N/A;    DIAGNOSTIC LAPAROSCOPY WITH REMOVAL OF ECTOPIC PREGNANCY Right 12/11/2021   Procedure: LAPAROSCOPIC RIGHT SALPINGECTOMY WITH REMOVAL OF ECTOPIC PREGNANCY, lysis of adhesions;  Surgeon: Aloha Gell, MD;  Location: [facility];  Service: Gynecology;  Laterality: Right;   ESOPHAGOGASTRODUODENOSCOPY (EGD) WITH PROPOFOL N/A 06/06/2020   Procedure: ESOPHAGOGASTRODUODENOSCOPY (EGD) WITH PROPOFOL;  Surgeon: Gatha Mayer, MD;  Location: [facility];  Service: Endoscopy;  Laterality: N/A;   HAND SURGERY Left    x3   PLACEMENT OF SETON N/A 01/30/2022   Procedure: PLACEMENT OF SETON;  Surgeon: Leighton Ruff, MD;  Location: [facility];  Service: General;  Laterality: N/A;   RECTAL EXAM UNDER ANESTHESIA N/A 01/30/2022   Procedure: ANAL EXAM UNDER ANESTHESIA;  Surgeon: Leighton Ruff, MD;  Location: [facility];  Service: General;  Laterality: N/A;   Social History   Social History Narrative   Engaged   occ EtOH, +  Marijuana, no tobacco now - former   Right Handed   Drinks Caffeine    One Story Home    family history includes Colitis in her father, mother, and paternal grandmother; Diabetes in her father; Heart disease in her mother; Other in her mother; Stomach cancer in her paternal aunt; Ulcerative colitis in her father, mother, and paternal grandmother.   Review of Systems As per HPI  Objective:   Physical Exam '@BP'$  124/70   Pulse 75   Ht '5\' 7"'$  (1.702 m)   Wt 273 lb (123.8 kg)   SpO2 99%   BMI 42.76 kg/m @  General:  NAD Abdomen:  soft and mildly tender diffusely, BS+     Data Reviewed:  See HPI

## 2023-01-15 NOTE — Patient Instructions (Signed)
Your provider has requested that you go to the basement level for lab work before leaving today. Press "B" on the elevator. The lab is located at the first door on the left as you exit the elevator.  Due to recent changes in healthcare laws, you may see the results of your imaging and laboratory studies on MyChart before your provider has had a chance to review them.  We understand that in some cases there may be results that are confusing or concerning to you. Not all laboratory results come back in the same time frame and the provider may be waiting for multiple results in order to interpret others.  Please give [place] 48 hours in order for your provider to thoroughly review all the results before contacting the office for clarification of your results.   We have sent the following medications to your pharmacy for you to pick up at your convenience: Zofran, protonix  Try the Questran that Dr Marcello Moores rx'ed.  I appreciate the opportunity to care for you. Silvano Rusk, MD, [facility]

## 2023-01-16 ENCOUNTER — Telehealth: Payer: Self-pay

## 2023-01-16 LAB — IGA: Immunoglobulin A: 311 mg/dL — ABNORMAL HIGH (ref 47–310)

## 2023-01-16 LAB — TISSUE TRANSGLUTAMINASE, IGA: (tTG) Ab, IgA: <1 U/mL

## 2023-01-16 NOTE — Telephone Encounter (Signed)
FMLA paperwork completed and emailed to patient per her request. Forms will be scanned into epic. I called and left her a detailed message that it was sent to her email.

## 2023-03-06 ENCOUNTER — Telehealth: Payer: Self-pay | Admitting: Internal Medicine

## 2023-03-06 DIAGNOSIS — Z8711 Personal history of peptic ulcer disease: Secondary | ICD-10-CM

## 2023-03-06 MED ORDER — ALOSETRON HCL 0.5 MG PO TABS: 0.5000 mg | ORAL_TABLET | Freq: Two times a day (BID) | ORAL | 2 refills | Status: DC

## 2023-03-06 MED ORDER — PANTOPRAZOLE SODIUM 40 MG PO TBEC: DELAYED_RELEASE_TABLET | ORAL | 3 refills | Status: DC

## 2023-03-06 NOTE — Progress Notes (Signed)
See phone note

## 2023-03-06 NOTE — Telephone Encounter (Signed)
Request for pills rather than cholestyramine noted on result note.  I would recommend she consider alosetron which are small capsules or pills twice a day.  To replace the cholestyramine is going to be for 1 g colestipol tablets twice a day and knees are quite large.  Please explain that to her and let me know.  It looks like the cost coverage is the same for the colestipol pills and the alosetron.  I think she should also be put in for next available follow-up in July

## 2023-03-06 NOTE — Telephone Encounter (Signed)
She would like to try the alosetron Sir. Please send in to the [facility] at [facility]. She is also requesting that we refill her a 90 days worth of protonix per her insurance. I will call her back with a July appointment , the computer was not working well when we were on the phone.

## 2023-03-06 NOTE — Telephone Encounter (Signed)
Unable to reach Barbara Orozco , her voice mail is FULL. I sent in the refill for her pantoprazole as requested and Dr Leone Payor sent in the alosetron-see note below. I set her up for July 16th at 8:30AM. I will try to reach her later to relay these messages.

## 2023-03-06 NOTE — Telephone Encounter (Signed)
Alosetron rx sent  Let her know that this is lower dose and if unhelpful we can increase and she should message or call us back before July visit if so  She is to also call if gets bad constipation or abdominal pain (stop med if so)  Stop cholestyramine now

## 2023-03-06 NOTE — Progress Notes (Deleted)
I think she should try alosetron instead  If we do pills to replace cholestyramine it is 4 huge pills bid  We should also make a next available f/u w/ me for her  Let me know

## 2023-03-07 NOTE — Telephone Encounter (Signed)
I reached [place] and passed on Dr Marvell Fuller message. She knows to contact us if she has any problems with the new medicine.

## 2023-03-27 NOTE — Patient Instructions (Signed)
DUE TO COVID-19 ONLY TWO VISITORS  (aged 37 and older)  ARE ALLOWED TO COME WITH YOU AND STAY IN THE WAITING ROOM ONLY DURING PRE OP AND PROCEDURE.   **NO VISITORS ARE ALLOWED IN THE SHORT STAY AREA OR RECOVERY ROOM!!**  IF YOU WILL BE ADMITTED INTO THE HOSPITAL YOU ARE ALLOWED ONLY FOUR SUPPORT PEOPLE DURING VISITATION HOURS ONLY (7 AM -8PM)   The support person(s) must pass our screening, gel in and out, and wear a mask at all times, including in the patient's room. Patients must also wear a mask when staff or their support person are in the room. Visitors GUEST BADGE MUST BE WORN VISIBLY  One adult visitor may remain with you overnight and MUST be in the room by 8 P.M.     Your procedure is scheduled on: 03/29/23   Report to [facility] Main Entrance    Report to admitting at : 5:15 AM   Call this number if you have problems the morning of surgery [phone]   Do not eat food :After Midnight.   Oral Hygiene is also important to reduce your risk of infection.                                    Remember - BRUSH YOUR TEETH THE MORNING OF SURGERY WITH YOUR REGULAR TOOTHPASTE  DENTURES WILL BE REMOVED PRIOR TO SURGERY PLEASE DO NOT APPLY "Poly grip" OR ADHESIVES!!!   Do NOT smoke after Midnight   Take these medicines the morning of surgery with A SIP OF WATER: alosetron.                              You may not have any metal on your body including hair pins, jewelry, and body piercing             Do not wear make-up, lotions, powders, perfumes/cologne, or deodorant  Do not wear nail polish including gel and S&S, artificial/acrylic nails, or any other type of covering on natural nails including finger and toenails. If you have artificial nails, gel coating, etc. that needs to be removed by a nail salon please have this removed prior to surgery or surgery may need to be canceled/ delayed if the surgeon/ anesthesia feels like they are unable to be safely monitored.   Do not  shave  48 hours prior to surgery.    Do not bring valuables to the hospital. [facility] IS NOT             RESPONSIBLE   FOR VALUABLES.   Contacts, glasses, or bridgework may not be worn into surgery.   Bring small overnight bag day of surgery.   DO NOT BRING YOUR HOME MEDICATIONS TO THE HOSPITAL. PHARMACY WILL DISPENSE MEDICATIONS LISTED ON YOUR MEDICATION LIST TO YOU DURING YOUR ADMISSION IN THE HOSPITAL!    Patients discharged on the day of surgery will not be allowed to drive home.  Someone NEEDS to stay with you for the first 24 hours after anesthesia.   Special Instructions: Bring a copy of your healthcare power of attorney and living will documents         the day of surgery if you haven't scanned them before.              Please read over the following fact sheets you were given:  IF YOU HAVE QUESTIONS ABOUT YOUR PRE-OP INSTRUCTIONS PLEASE CALL [phone]    [facility] - Preparing for Surgery Before surgery, you can play an important role.  Because skin is not sterile, your skin needs to be as free of germs as possible.  You can reduce the number of germs on your skin by washing with CHG (chlorahexidine gluconate) soap before surgery.  CHG is an antiseptic cleaner which kills germs and bonds with the skin to continue killing germs even after washing. Please DO NOT use if you have an allergy to CHG or antibacterial soaps.  If your skin becomes reddened/irritated stop using the CHG and inform your nurse when you arrive at Short Stay. Do not shave (including legs and underarms) for at least 48 hours prior to the first CHG shower.  You may shave your face/neck. Please follow these instructions carefully:  1.  Shower with CHG Soap the night before surgery and the  morning of Surgery.  2.  If you choose to wash your hair, wash your hair first as usual with your  normal  shampoo.  3.  After you shampoo, rinse your hair and body thoroughly to remove the  shampoo.                            4.  Use CHG as you would any other liquid soap.  You can apply chg directly  to the skin and wash                       Gently with a scrungie or clean washcloth.  5.  Apply the CHG Soap to your body ONLY FROM THE NECK DOWN.   Do not use on face/ open                           Wound or open sores. Avoid contact with eyes, ears mouth and genitals (private parts).                       Wash face,  Genitals (private parts) with your normal soap.             6.  Wash thoroughly, paying special attention to the area where your surgery  will be performed.  7.  Thoroughly rinse your body with warm water from the neck down.  8.  DO NOT shower/wash with your normal soap after using and rinsing off  the CHG Soap.                9.  Pat yourself dry with a clean towel.            10.  Wear clean pajamas.            11.  Place clean sheets on your bed the night of your first shower and do not  sleep with pets. Day of Surgery : Do not apply any lotions/deodorants the morning of surgery.  Please wear clean clothes to the hospital/surgery center.  FAILURE TO FOLLOW THESE INSTRUCTIONS MAY RESULT IN THE CANCELLATION OF YOUR SURGERY PATIENT SIGNATURE_________________________________  NURSE SIGNATURE__________________________________  ________________________________________________________________________

## 2023-03-28 ENCOUNTER — Other Ambulatory Visit: Payer: Self-pay

## 2023-03-28 ENCOUNTER — Encounter (HOSPITAL_COMMUNITY)
Admission: RE | Admit: 2023-03-28 | Discharge: 2023-03-28 | Disposition: A | Discharge status: Home / Self Care | Payer: No Typology Code available for payment source | Source: Ambulatory Visit | Attending: General Surgery | Admitting: General Surgery

## 2023-03-28 ENCOUNTER — Encounter (HOSPITAL_COMMUNITY): Payer: Self-pay

## 2023-03-28 VITALS — BP 108/91 | HR 72 | Temp 97.7°F | Ht 67.0 in | Wt 277.0 lb

## 2023-03-28 DIAGNOSIS — Z01812 Encounter for preprocedural laboratory examination: Secondary | ICD-10-CM | POA: Insufficient documentation

## 2023-03-28 DIAGNOSIS — Z01818 Encounter for other preprocedural examination: Secondary | ICD-10-CM

## 2023-03-28 LAB — CBC
HCT: 42.4 % (ref 36.0–46.0)
Hemoglobin: 13.9 g/dL (ref 12.0–15.0)
MCH: 29.3 pg (ref 26.0–34.0)
MCHC: 32.8 g/dL (ref 30.0–36.0)
MCV: 89.5 fL (ref 80.0–100.0)
Platelets: 358 10*3/uL (ref 150–400)
RBC: 4.74 MIL/uL (ref 3.87–5.11)
RDW: 13.8 % (ref 11.5–15.5)
WBC: 9.3 10*3/uL (ref 4.0–10.5)
nRBC: 0 % (ref 0.0–0.2)

## 2023-03-28 NOTE — Progress Notes (Signed)
For Short Stay: COVID SWAB appointment date:  Bowel Prep reminder:   For Anesthesia: PCP - N/A Cardiologist - N/A Neurologist: Dr. Patrcia Dolly. Chest x-ray -  EKG - 05/31/22 Stress Test -  ECHO -  Cardiac Cath -  Pacemaker/ICD device last checked: Pacemaker orders received: Device Rep notified:  Spinal Cord Stimulator:  Sleep Study - N/A CPAP -   Fasting Blood Sugar - N/A Checks Blood Sugar _____ times a day Date and result of last Hgb A1c-  Last dose of GLP1 agonist- N/A GLP1 instructions:   Last dose of SGLT-2 inhibitors- N/A SGLT-2 instructions:   Blood Thinner Instructions: N/A Aspirin Instructions: Last Dose:  Activity level: Can go up a flight of stairs and activities of daily living without stopping and without chest pain and/or shortness of breath   Able to exercise without chest pain and/or shortness of breath  Anesthesia review: Hx: Seizures. Last one two weeks ago.  Patient denies shortness of breath, fever, cough and chest pain at PAT appointment   Patient verbalized understanding of instructions that were given to them at the PAT appointment. Patient was also instructed that they will need to review over the PAT instructions again at home before surgery.

## 2023-03-28 NOTE — Anesthesia Preprocedure Evaluation (Addendum)
Anesthesia Evaluation  Patient identified by MRN, date of birth, ID band Patient awake    Reviewed: Allergy & Precautions, NPO status , Patient's Chart, lab work & pertinent test results  Airway Mallampati: II  TM Distance: >3 FB Neck ROM: Full    Dental  (+) Teeth Intact, Dental Advisory Given   Pulmonary asthma , former smoker   Pulmonary exam normal breath sounds clear to auscultation       Cardiovascular Exercise Tolerance: Good negative cardio ROS Normal cardiovascular exam Rhythm:Regular Rate:Normal     Neuro/Psych Seizures -,  PSYCHIATRIC DISORDERS Anxiety Depression       GI/Hepatic PUD,GERD  Medicated,,(+)     substance abuse  cocaine use and marijuana useUC Anal fistula    Endo/Other    Morbid obesity  Renal/GU negative Renal ROS     Musculoskeletal negative musculoskeletal ROS (+)    Abdominal   Peds  Hematology negative hematology ROS (+)   Anesthesia Other Findings   Reproductive/Obstetrics negative OB ROS                             Anesthesia Physical Anesthesia Plan  ASA: 3  Anesthesia Plan: General   Post-op Pain Management: Tylenol PO (pre-op)* and Precedex   Induction: Intravenous  PONV Risk Score and Plan: 3 and Scopolamine patch - Pre-op, Midazolam, Dexamethasone and Ondansetron  Airway Management Planned: Oral ETT  Additional Equipment:   Intra-op Plan:   Post-operative Plan: Extubation in OR  Informed Consent: I have reviewed the patients History and Physical, chart, labs and discussed the procedure including the risks, benefits and alternatives for the proposed anesthesia with the patient or authorized representative who has indicated his/her understanding and acceptance.     Dental advisory given  Plan Discussed with: CRNA  Anesthesia Plan Comments:         Anesthesia Quick Evaluation

## 2023-03-29 ENCOUNTER — Encounter (HOSPITAL_COMMUNITY): Payer: Self-pay | Admitting: General Surgery

## 2023-03-29 ENCOUNTER — Ambulatory Visit (HOSPITAL_COMMUNITY)
Admission: RE | Admit: 2023-03-29 | Discharge: 2023-03-29 | Disposition: A | Discharge status: Home / Self Care | Payer: 59 | Attending: General Surgery | Admitting: General Surgery

## 2023-03-29 ENCOUNTER — Ambulatory Visit (HOSPITAL_BASED_OUTPATIENT_CLINIC_OR_DEPARTMENT_OTHER): Payer: 59 | Admitting: Anesthesiology

## 2023-03-29 ENCOUNTER — Ambulatory Visit (HOSPITAL_COMMUNITY): Payer: 59 | Admitting: Anesthesiology

## 2023-03-29 ENCOUNTER — Encounter (HOSPITAL_COMMUNITY)
Admission: RE | Disposition: A | Discharge status: Home / Self Care | Payer: Self-pay | Source: Home / Self Care | Attending: General Surgery

## 2023-03-29 DIAGNOSIS — Z87891 Personal history of nicotine dependence: Secondary | ICD-10-CM | POA: Insufficient documentation

## 2023-03-29 DIAGNOSIS — K603 Anal fistula: Secondary | ICD-10-CM | POA: Diagnosis not present

## 2023-03-29 DIAGNOSIS — K529 Noninfective gastroenteritis and colitis, unspecified: Secondary | ICD-10-CM

## 2023-03-29 DIAGNOSIS — Z6841 Body Mass Index (BMI) 40.0 and over, adult: Secondary | ICD-10-CM | POA: Diagnosis not present

## 2023-03-29 DIAGNOSIS — K219 Gastro-esophageal reflux disease without esophagitis: Secondary | ICD-10-CM | POA: Diagnosis not present

## 2023-03-29 DIAGNOSIS — R569 Unspecified convulsions: Secondary | ICD-10-CM | POA: Diagnosis not present

## 2023-03-29 DIAGNOSIS — Z8711 Personal history of peptic ulcer disease: Secondary | ICD-10-CM | POA: Diagnosis not present

## 2023-03-29 DIAGNOSIS — J45909 Unspecified asthma, uncomplicated: Secondary | ICD-10-CM | POA: Diagnosis not present

## 2023-03-29 DIAGNOSIS — Z01818 Encounter for other preprocedural examination: Secondary | ICD-10-CM

## 2023-03-29 HISTORY — PX: LIGATION OF INTERNAL FISTULA TRACT: SHX6551

## 2023-03-29 LAB — POCT PREGNANCY, URINE: Preg Test, Ur: NEGATIVE

## 2023-03-29 SURGERY — LIGATION, INTERNAL FISTULA TRACT
Anesthesia: General

## 2023-03-29 MED ORDER — DIPHENHYDRAMINE HCL 50 MG/ML IJ SOLN
INTRAMUSCULAR | Status: DC | PRN
  Administered 2023-03-29: 12.5 mg via INTRAVENOUS

## 2023-03-29 MED ORDER — SODIUM CHLORIDE 0.9% FLUSH: 3.0000 mL | Freq: Two times a day (BID) | INTRAVENOUS | Status: DC

## 2023-03-29 MED ORDER — BUPIVACAINE LIPOSOME 1.3 % IJ SUSP: 20.0000 mL | Freq: Once | INTRAMUSCULAR | Status: DC

## 2023-03-29 MED ORDER — LACTATED RINGERS IV SOLN: INTRAVENOUS | Status: DC

## 2023-03-29 MED ORDER — ONDANSETRON HCL 4 MG/2ML IJ SOLN
INTRAMUSCULAR | Status: AC
  Filled 2023-03-29: qty 2

## 2023-03-29 MED ORDER — SUGAMMADEX SODIUM 200 MG/2ML IV SOLN
INTRAVENOUS | Status: DC | PRN
  Administered 2023-03-29: 250 mg via INTRAVENOUS

## 2023-03-29 MED ORDER — DIPHENHYDRAMINE HCL 50 MG/ML IJ SOLN
INTRAMUSCULAR | Status: AC
  Filled 2023-03-29: qty 1

## 2023-03-29 MED ORDER — SCOPOLAMINE 1 MG/3DAYS TD PT72
MEDICATED_PATCH | TRANSDERMAL | Status: AC
  Administered 2023-03-29: 1.5 mg via TRANSDERMAL
  Filled 2023-03-29: qty 1

## 2023-03-29 MED ORDER — PROPOFOL 10 MG/ML IV BOLUS
INTRAVENOUS | Status: DC | PRN
  Administered 2023-03-29: 30 mg via INTRAVENOUS
  Administered 2023-03-29: 170 mg via INTRAVENOUS

## 2023-03-29 MED ORDER — OXYCODONE HCL 5 MG PO TABS: 5.0000 mg | ORAL_TABLET | Freq: Four times a day (QID) | ORAL | 0 refills | Status: DC | PRN

## 2023-03-29 MED ORDER — ORAL CARE MOUTH RINSE: 15.0000 mL | Freq: Once | OROMUCOSAL | Status: AC

## 2023-03-29 MED ORDER — DEXMEDETOMIDINE HCL IN NACL 80 MCG/20ML IV SOLN
INTRAVENOUS | Status: DC | PRN
  Administered 2023-03-29: 8 ug via INTRAVENOUS
  Administered 2023-03-29: 4 ug via INTRAVENOUS
  Administered 2023-03-29: 12 ug via INTRAVENOUS

## 2023-03-29 MED ORDER — PROPOFOL 10 MG/ML IV BOLUS
INTRAVENOUS | Status: AC
  Filled 2023-03-29: qty 20

## 2023-03-29 MED ORDER — 0.9 % SODIUM CHLORIDE (POUR BTL) OPTIME
TOPICAL | Status: DC | PRN
  Administered 2023-03-29: 1000 mL

## 2023-03-29 MED ORDER — SODIUM CHLORIDE 0.9 % IV SOLN: 250.0000 mL | INTRAVENOUS | Status: DC | PRN

## 2023-03-29 MED ORDER — PROMETHAZINE HCL 25 MG/ML IJ SOLN: 6.2500 mg | INTRAMUSCULAR | Status: DC | PRN

## 2023-03-29 MED ORDER — FENTANYL CITRATE (PF) 250 MCG/5ML IJ SOLN
INTRAMUSCULAR | Status: DC | PRN
  Administered 2023-03-29 (×2): 100 ug via INTRAVENOUS
  Administered 2023-03-29: 50 ug via INTRAVENOUS

## 2023-03-29 MED ORDER — ACETAMINOPHEN 500 MG PO TABS: 1000.0000 mg | ORAL_TABLET | ORAL | Status: AC

## 2023-03-29 MED ORDER — BUPIVACAINE-EPINEPHRINE 0.25% -1:200000 IJ SOLN
INTRAMUSCULAR | Status: AC
  Filled 2023-03-29: qty 1

## 2023-03-29 MED ORDER — HYDROGEN PEROXIDE 3 % EX SOLN
CUTANEOUS | Status: DC | PRN
  Administered 2023-03-29: 1

## 2023-03-29 MED ORDER — SCOPOLAMINE 1 MG/3DAYS TD PT72: 1.0000 | MEDICATED_PATCH | Freq: Once | TRANSDERMAL | Status: DC

## 2023-03-29 MED ORDER — BUPIVACAINE HCL (PF) 0.5 % IJ SOLN
INTRAMUSCULAR | Status: AC
  Filled 2023-03-29: qty 30

## 2023-03-29 MED ORDER — FENTANYL CITRATE PF 50 MCG/ML IJ SOSY: 25.0000 ug | PREFILLED_SYRINGE | INTRAMUSCULAR | Status: DC | PRN

## 2023-03-29 MED ORDER — ONDANSETRON HCL 4 MG/2ML IJ SOLN
INTRAMUSCULAR | Status: DC | PRN
  Administered 2023-03-29: 4 mg via INTRAVENOUS

## 2023-03-29 MED ORDER — ACETAMINOPHEN 500 MG PO TABS
ORAL_TABLET | ORAL | Status: AC
  Administered 2023-03-29: 1000 mg via ORAL
  Filled 2023-03-29: qty 2

## 2023-03-29 MED ORDER — DEXAMETHASONE SODIUM PHOSPHATE 10 MG/ML IJ SOLN
INTRAMUSCULAR | Status: DC | PRN
  Administered 2023-03-29: 4 mg via INTRAVENOUS

## 2023-03-29 MED ORDER — BUPIVACAINE LIPOSOME 1.3 % IJ SUSP
INTRAMUSCULAR | Status: AC
  Filled 2023-03-29: qty 20

## 2023-03-29 MED ORDER — ROCURONIUM BROMIDE 10 MG/ML (PF) SYRINGE
PREFILLED_SYRINGE | INTRAVENOUS | Status: AC
  Filled 2023-03-29: qty 10

## 2023-03-29 MED ORDER — DEXMEDETOMIDINE HCL IN NACL 80 MCG/20ML IV SOLN
INTRAVENOUS | Status: AC
  Filled 2023-03-29: qty 20

## 2023-03-29 MED ORDER — BUPIVACAINE-EPINEPHRINE (PF) 0.25% -1:200000 IJ SOLN
INTRAMUSCULAR | Status: DC | PRN
  Administered 2023-03-29: 50 mL

## 2023-03-29 MED ORDER — MIDAZOLAM HCL 2 MG/2ML IJ SOLN
INTRAMUSCULAR | Status: AC
  Filled 2023-03-29: qty 2

## 2023-03-29 MED ORDER — FENTANYL CITRATE (PF) 250 MCG/5ML IJ SOLN
INTRAMUSCULAR | Status: AC
  Filled 2023-03-29: qty 5

## 2023-03-29 MED ORDER — MIDAZOLAM HCL 5 MG/5ML IJ SOLN
INTRAMUSCULAR | Status: DC | PRN
  Administered 2023-03-29: 2 mg via INTRAVENOUS

## 2023-03-29 MED ORDER — DEXAMETHASONE SODIUM PHOSPHATE 10 MG/ML IJ SOLN
INTRAMUSCULAR | Status: AC
  Filled 2023-03-29: qty 1

## 2023-03-29 MED ORDER — LIDOCAINE HCL (PF) 2 % IJ SOLN
INTRAMUSCULAR | Status: AC
  Filled 2023-03-29: qty 5

## 2023-03-29 MED ORDER — ROCURONIUM BROMIDE 50 MG/5ML IV SOSY
PREFILLED_SYRINGE | INTRAVENOUS | Status: DC | PRN
  Administered 2023-03-29: 50 mg via INTRAVENOUS

## 2023-03-29 MED ORDER — CHLORHEXIDINE GLUCONATE 0.12 % MT SOLN
15.0000 mL | Freq: Once | OROMUCOSAL | Status: AC
  Administered 2023-03-29: 15 mL via OROMUCOSAL

## 2023-03-29 MED ORDER — LIDOCAINE 2% (20 MG/ML) 5 ML SYRINGE
INTRAMUSCULAR | Status: DC | PRN
  Administered 2023-03-29: 80 mg via INTRAVENOUS

## 2023-03-29 SURGICAL SUPPLY — 58 items
APL SKNCLS STERI-STRIP NONHPOA (GAUZE/BANDAGES/DRESSINGS) ×2
BAG COUNTER SPONGE SURGICOUNT (BAG) IMPLANT
BAG SPNG CNTER NS LX DISP (BAG)
BENZOIN TINCTURE PRP APPL 2/3 (GAUZE/BANDAGES/DRESSINGS) ×2 IMPLANT
BLADE EXTENDED COATED 6.5IN (ELECTRODE) IMPLANT
BLADE SURG 15 STRL LF DISP TIS (BLADE) ×1 IMPLANT
BLADE SURG 15 STRL SS (BLADE) ×1
BLADE SURG SZ10 CARB STEEL (BLADE) IMPLANT
BNDG GAUZE DERMACEA FLUFF 4 (GAUZE/BANDAGES/DRESSINGS) IMPLANT
BNDG GZE DERMACEA 4 6PLY (GAUZE/BANDAGES/DRESSINGS) ×1
BRIEF MESH DISP LRG (UNDERPADS AND DIAPERS) ×1 IMPLANT
DRAPE LAPAROTOMY T 98X78 PEDS (DRAPES) ×1 IMPLANT
DRAPE UTILITY XL STRL (DRAPES) IMPLANT
DRSG VASELINE 3X18 (GAUZE/BANDAGES/DRESSINGS) IMPLANT
GAUZE 4X4 16PLY ~~LOC~~+RFID DBL (SPONGE) ×1 IMPLANT
GAUZE PAD ABD 7.5X8 STRL (GAUZE/BANDAGES/DRESSINGS) IMPLANT
GAUZE PAD ABD 8X10 STRL (GAUZE/BANDAGES/DRESSINGS) ×1 IMPLANT
GAUZE SPONGE 4X4 12PLY STRL (GAUZE/BANDAGES/DRESSINGS) ×1 IMPLANT
GLOVE BIO SURGEON STRL SZ 6.5 (GLOVE) ×1 IMPLANT
GLOVE BIOGEL PI IND STRL 7.0 (GLOVE) ×1 IMPLANT
GLOVE INDICATOR 6.5 STRL GRN (GLOVE) ×1 IMPLANT
GOWN STRL REUS W/ TWL XL LVL3 (GOWN DISPOSABLE) ×2 IMPLANT
GOWN STRL REUS W/TWL XL LVL3 (GOWN DISPOSABLE) ×2
IV CATH 14GX2 1/4 (CATHETERS) ×1 IMPLANT
IV CATH AUTO 14GX1.75 SAFE ORG (IV SOLUTION) IMPLANT
KIT BASIN OR (CUSTOM PROCEDURE TRAY) ×1 IMPLANT
KIT TURNOVER KIT A (KITS) IMPLANT
LOOP VESSEL MAXI BLUE (MISCELLANEOUS) IMPLANT
MANIFOLD NEPTUNE II (INSTRUMENTS) ×1 IMPLANT
NDL HYPO 25X1 1.5 SAFETY (NEEDLE) ×1 IMPLANT
NEEDLE HYPO 25X1 1.5 SAFETY (NEEDLE) ×1 IMPLANT
PACK BASIC VI WITH GOWN DISP (CUSTOM PROCEDURE TRAY) ×1 IMPLANT
PENCIL SMOKE EVACUATOR (MISCELLANEOUS) IMPLANT
PROTECTOR NERVE ULNAR (MISCELLANEOUS) IMPLANT
RETRACTOR LONE STAR DISPOSABLE (INSTRUMENTS) IMPLANT
RETRACTOR STAY HOOK 5MM (MISCELLANEOUS) IMPLANT
SPIKE FLUID TRANSFER (MISCELLANEOUS) ×1 IMPLANT
SPONGE SURGIFOAM ABS GEL 12-7 (HEMOSTASIS) IMPLANT
SUCTION FRAZIER HANDLE 10FR (MISCELLANEOUS) ×1
SUCTION TUBE FRAZIER 10FR DISP (MISCELLANEOUS) ×1 IMPLANT
SUT CHROMIC 2 0 SH (SUTURE) IMPLANT
SUT CHROMIC 3 0 SH 27 (SUTURE) IMPLANT
SUT ETHIBOND 0 (SUTURE) IMPLANT
SUT SILK 2 0 (SUTURE)
SUT SILK 2-0 18XBRD TIE 12 (SUTURE) IMPLANT
SUT SILK 3 0 SH 30 (SUTURE) IMPLANT
SUT VIC AB 3-0 SH 18 (SUTURE) IMPLANT
SUT VIC AB 3-0 SH 27 (SUTURE) ×2
SUT VIC AB 3-0 SH 27XBRD (SUTURE) IMPLANT
SUT VIC AB 3-0 SH 8-18 (SUTURE) ×1 IMPLANT
SYR 20ML LL LF (SYRINGE) ×1 IMPLANT
SYR BULB IRRIG 60ML STRL (SYRINGE) ×1 IMPLANT
SYR CONTROL 10ML LL (SYRINGE) ×1 IMPLANT
TOWEL OR 17X26 10 PK STRL BLUE (TOWEL DISPOSABLE) ×1 IMPLANT
TOWEL OR NON WOVEN STRL DISP B (DISPOSABLE) ×1 IMPLANT
TUBING CONNECTING 10 (TUBING) ×1 IMPLANT
UNDERPAD 30X36 HEAVY ABSORB (UNDERPADS AND DIAPERS) IMPLANT
YANKAUER SUCT BULB TIP NO VENT (SUCTIONS) ×1 IMPLANT

## 2023-03-29 NOTE — Anesthesia Postprocedure Evaluation (Signed)
Anesthesia Post Note  Patient: Barbara Orozco  Procedure(s) Performed: LIGATION OF INTERNAL FISTULA TRACT     Patient location during evaluation: PACU Anesthesia Type: General Level of consciousness: awake and alert Pain management: pain level controlled Vital Signs Assessment: post-procedure vital signs reviewed and stable Respiratory status: spontaneous breathing, nonlabored ventilation, respiratory function stable and patient connected to nasal cannula oxygen Cardiovascular status: blood pressure returned to baseline and stable Postop Assessment: no apparent nausea or vomiting Anesthetic complications: no   No notable events documented.  Last Vitals:  Vitals:   03/29/23 0900 03/29/23 0915  BP: 110/64 (!) 124/93  Pulse: 66 (!) 53  Resp: 14 18  Temp:  36.6 C  SpO2: 99% 98%    Last Pain:  Vitals:   03/29/23 0915  TempSrc: Oral  PainSc: 0-No pain                 Collene Schlichter

## 2023-03-29 NOTE — Anesthesia Procedure Notes (Signed)
Procedure Name: Intubation Date/Time: 03/29/2023 7:49 AM  Performed by: Adria Dill, CRNAPre-anesthesia Checklist: Patient identified, Emergency Drugs available, Suction available and Patient being monitored Patient Re-evaluated:Patient Re-evaluated prior to induction Oxygen Delivery Method: Circle system utilized Preoxygenation: Pre-oxygenation with 100% oxygen Induction Type: IV induction Ventilation: Mask ventilation without difficulty Laryngoscope Size: Miller and 3 Grade View: Grade I Tube type: Oral Tube size: 7.0 mm Number of attempts: 1 Airway Equipment and Method: Stylet and Oral airway Placement Confirmation: ETT inserted through vocal cords under direct vision, positive ETCO2 and breath sounds checked- equal and bilateral Tube secured with: Tape Dental Injury: Teeth and Oropharynx as per pre-operative assessment

## 2023-03-29 NOTE — H&P (Signed)
PROVIDER:  Elenora Gamma, MD   MRN: [id number] DOB: 12-Feb-1986  Interval History:    She presented to the office with anal pain.  She was undergoing a work-up for possible IBD.  She states over the past 6 years she has had 7-8 perianal abscesses.  1 of these was lanced but the remaining resolved with antibiotics and spontaneous drainage.  She states all abscesses were in the same location except for 1.  She had been taking steroids for possible IBD and colonoscopy in 2021 showed no sign of inflammatory bowel disease and showed she was taken off the steroids at that time.  Since that time her symptoms have worsened.   I recommended an exam under anesthesia with seton placement and further evaluation by GI.  She underwent a colonoscopy over the summer which showed no sign of inflammatory disease.  She has tried multiple meds to help with her diarrhea with the help of Dr Leone Payor       Past Medical History:  Diagnosis Date   Acute posttraumatic stress disorder     Allergy     Anal fistula     Arm fracture      right and left   Asthma     Borderline personality disorder (HCC) 05/23/2022   Cannabis use disorder     Cocaine use disorder, moderate, in early remission (HCC)     Complication of anesthesia      "wakes up angry for about 5 minutes"   Depression     Endometriosis     Endometriosis     Foot pain      right foot hurts since last night no reported injury per pt on 01-25-2022   GERD (gastroesophageal reflux disease)     Grief associated with loss of fetus     History of 2019 novel coronavirus disease (COVID-19) 04/29/2020    + Ag test - can see in Care everywhere   History of chicken pox      had 4 or 5 times as child   History of COVID-19 04/29/2020    mild all symptoms resolved   IBS (irritable bowel syndrome)     Irritable bowel     Leg fracture, left     Psychogenic nonepileptic seizure      last seizure 1 month ago per pt on 01-25-2022 saw dr Yvone Neu  neurology   Seizure [facility])      last seizure 12-28-2021 per pt on 01-25-2022   Self-injurious behavior     Shingles      intermittent outbreaks   Ulcerative colitis (HCC)     Wears glasses           Past Surgical History:  Procedure Laterality Date   APPENDECTOMY       BIOPSY   06/06/2020    Procedure: BIOPSY;  Surgeon: Iva Boop, MD;  Location: [facility];  Service: Endoscopy;;   COLONOSCOPY WITH PROPOFOL N/A 06/06/2020    Procedure: COLONOSCOPY WITH PROPOFOL;  Surgeon: Iva Boop, MD;  Location: [facility];  Service: Endoscopy;  Laterality: N/A;   DIAGNOSTIC LAPAROSCOPY WITH REMOVAL OF ECTOPIC PREGNANCY Right 12/11/2021    Procedure: LAPAROSCOPIC RIGHT SALPINGECTOMY WITH REMOVAL OF ECTOPIC PREGNANCY, lysis of adhesions;  Surgeon: Noland Fordyce, MD;  Location: [facility] OR;  Service: Gynecology;  Laterality: Right;   ESOPHAGOGASTRODUODENOSCOPY (EGD) WITH PROPOFOL N/A 06/06/2020    Procedure: ESOPHAGOGASTRODUODENOSCOPY (EGD) WITH PROPOFOL;  Surgeon: Iva Boop, MD;  Location: [facility];  Service: Endoscopy;  Laterality: N/A;   HAND SURGERY Left      x3   PLACEMENT OF SETON N/A 01/30/2022    Procedure: PLACEMENT OF SETON;  Surgeon: Romie Levee, MD;  Location: [facility];  Service: General;  Laterality: N/A;   RECTAL EXAM UNDER ANESTHESIA N/A 01/30/2022    Procedure: ANAL EXAM UNDER ANESTHESIA;  Surgeon: Romie Levee, MD;  Location: [facility];  Service: General;  Laterality: N/A;         Family History  Problem Relation Age of Onset   Colitis Mother     Ulcerative colitis Mother     Heart disease Mother     Other Mother     Colitis Father     Ulcerative colitis Father     Diabetes Father     Stomach cancer Paternal Aunt     Colitis Paternal Grandmother     Ulcerative colitis Paternal Grandmother     Colon cancer Neg Hx     Pancreatic cancer Neg Hx     Esophageal cancer Neg Hx      Social History         Socioeconomic History   Marital status: Divorced      Spouse  name: Not on file   Number of children: Not on file   Years of education: Not on file   Highest education level: Not on file  Occupational History      Employer: [employer]  Tobacco Use   Smoking status: Former      Types: Cigarettes   Smokeless tobacco: Never   Tobacco comments:      Social smoker quit 6-8 yrs ago per pt on 01-25-2022  Vaping Use   Vaping Use: Never used  Substance and Sexual Activity   Alcohol use: Not Currently      Comment: Ocaasionally   Drug use: Not Currently   Sexual activity: Yes  Other Topics Concern   Not on file  Social History Narrative    Engaged    occ EtOH, + Marijuana, no tobacco now - former    Right Handed    Drinks Caffeine     One Story Home     Social Determinants of Health    Financial Resource Strain: Not on file  Food Insecurity: Not on file  Transportation Needs: Not on file  Physical Activity: Not on file  Stress: Not on file  Social Connections: Not on file  Intimate Partner Violence: Not on file      Current Outpatient Medications:    acetaminophen-codeine (TYLENOL #3) 300-30 MG tablet, Take 1 tablet by mouth every 8 (eight) hours as needed for moderate pain., Disp: 30 tablet, Rfl: 0   diclofenac Sodium (VOLTAREN) 1 % GEL, Apply 2 g topically 3 (three) times daily., Disp: 100 g, Rfl: 0   diclofenac Sodium (VOLTAREN) 1 % GEL, Apply 2 g topically 4 (four) times daily as needed., Disp: 150 g, Rfl: 2   lidocaine (LIDODERM) 5 %, Place 1 patch onto the skin daily. Remove & Discard patch within 12 hours or as directed by MD, Disp: 30 patch, Rfl: 0   Multiple Vitamin (MULTIVITAMIN WITH MINERALS) TABS tablet, Take 1 tablet by mouth daily., Disp: , Rfl:    norethindrone (AYGESTIN) 5 MG tablet, Take 10 mg by mouth every morning., Disp: , Rfl:    ondansetron (ZOFRAN-ODT) 4 MG disintegrating tablet, Take 1 tablet (4 mg total) by mouth every 8 (eight) hours as needed for nausea  or vomiting., Disp: 30 tablet, Rfl: 5   pantoprazole (PROTONIX) 40  MG tablet, TAKE 1 TABLET BY MOUTH TWICE DAILY BEFORE MEAL(S), Disp: 180 tablet, Rfl: 3   predniSONE (DELTASONE) 20 MG tablet, Take 2 tablets daily with breakfast., Disp: 10 tablet, Rfl: 0   topiramate (TOPAMAX) 50 MG tablet, Take 1 tablet (50 mg total) by mouth daily. (Patient not taking: Reported on 06/07/2022), Disp: 30 tablet, Rfl: 0      Allergies  Allergen Reactions   Bee Venom Anaphylaxis   Pork Allergy Anaphylaxis   Tramadol Rash and Other (See Comments)      Pt has Crohn's  Disease - Tramadol makes her stomach hurt/cramps   Cinnamon Itching and Other (See Comments)      Makes mouth burn and itch   Nsaids Other (See Comments)      Avoid due to Crohn's disease   Lithium Rash    Review of Systems - Negative except as stated above   Physical Examination:    Vitals:   03/29/23 0552  BP: 118/80  Pulse: 67  Resp: 18  Temp: 98.6 F (37 C)  SpO2: 96%    Gen: NAD CV: RRR Lungs: CTA Abd: soft Rectal: seton in place   Assessment and Plan:    Barbara Orozco is a 37 y.o. 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  She saw Dr Leone Payor to discuss her diarrhea and bowel symptoms.  This was felt to be mainly IBS.  Colonoscopy off all steroids showed no sign of inflammatory disease.  We discussed the LIFT procedure in detail.  We discussed that she will need to be off work for 2 to 3 weeks to recover from surgery.  We discussed alternatives including removal of seton, fistula plug and fistulotomy.  Patient would like to schedule surgery.  She understands that her recurrence rate will be higher if her diarrhea is not under control.        The plan was discussed in detail with the patient today, who expressed understanding.  The patient has my contact information, and understands to call me with any additional questions or concerns in the interval.  I would be happy to see the patient back sooner if the need arises.    Vanita Panda, MD Colon and Rectal  Surgery [facility] Surgery

## 2023-03-29 NOTE — Discharge Instructions (Addendum)

## 2023-03-29 NOTE — Op Note (Signed)
03/29/2023  8:28 AM  PATIENT:  Barbara Orozco  37 y.o. female  Patient Care Team: Pcp, No as PCP - General Van Clines, MD as Consulting Physician (Neurology)  PRE-OPERATIVE DIAGNOSIS:  ANAL FISTULA  POST-OPERATIVE DIAGNOSIS:  ANAL FISTULA  PROCEDURE:  LIGATION OF INTERNAL FISTULA TRACT   Surgeon(s): Romie Levee, MD  ASSISTANT: none   ANESTHESIA:   local and general  SPECIMEN:  No Specimen  DISPOSITION OF SPECIMEN:  N/A  COUNTS:  YES  PLAN OF CARE: Discharge to home after PACU  PATIENT DISPOSITION:  PACU - hemodynamically stable.  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   OR FINDINGS: well matured intersphincteric fistula  DESCRIPTION: the patient was identified in the preoperative holding area and taken to the OR where they were laid on the operating room table.  General anesthesia was induced without difficulty. The patient was then positioned in prone jackknife position with buttocks gently taped apart.  The patient was then prepped and draped in usual sterile fashion.  SCDs were noted to be in place prior to the initiation of anesthesia. A surgical timeout was performed indicating the correct patient, procedure, positioning and need for preoperative antibiotics.  A rectal block was performed using Marcaine with epinephrine mixed with Experel.    I began with a digital rectal exam.  The anal canal was gently dilated.  I then placed a Hill-Ferguson anoscope into the anal canal and evaluated this completely.  There internal opening was noted about halfway down the internal sphincter.   The external opening was coming out into the intersphincteric groove directly over the internal opening.  I decided to cut down on the external opening tract within the intersphincteric groove.   I continued my dissection within the intersphincteric groove down to below the fistula tract.  I dissected the fistula tract out from the intersphincteric space  down to the level of the internal sphincter.  This was then transected and closed with 2 interrupted 3-0 silk sutures.  Hydrogen peroxide was injected from the internal opening to confirm complete closure of the fistula tract.  The entire area was then irrigated with normal saline.  The internal opening was closed using a 3-0 Vicryl suture and a 2-0 chromic suture overlying this.  I then closed the intersphincteric groove with interrupted 3-0 Vicryl sutures and closed the skin with interrupted 2-0 chromic sutures.  Hemostasis was good.  The patient was awakened from anesthesia and sent to the postanesthesia care unit in stable condition.  All counts were correct per operating room staff.  Vanita Panda, MD  Colorectal and General Surgery [facility] Surgery

## 2023-03-29 NOTE — Transfer of Care (Signed)
Immediate Anesthesia Transfer of Care Note  Patient: Barbara Orozco  Procedure(s) Performed: LIGATION OF INTERNAL FISTULA TRACT  Patient Location: PACU  Anesthesia Type:General  Level of Consciousness: awake and patient cooperative  Airway & Oxygen Therapy: Patient Spontanous Breathing and Patient connected to face mask oxygen  Post-op Assessment: Report given to RN and Post -op Vital signs reviewed and stable  Post vital signs: Reviewed and stable  Last Vitals:  Vitals Value Taken Time  BP 141/96 03/29/23 0846  Temp    Pulse 84 03/29/23 0851  Resp 25 03/29/23 0851  SpO2 98 % 03/29/23 0851  Vitals shown include unvalidated device data.  Last Pain:  Vitals:   03/29/23 0552  TempSrc: Oral  PainSc: 0-No pain         Complications: No notable events documented.

## 2023-03-30 ENCOUNTER — Encounter (HOSPITAL_COMMUNITY): Payer: Self-pay | Admitting: General Surgery

## 2023-04-02 ENCOUNTER — Other Ambulatory Visit: Payer: Self-pay

## 2023-04-17 ENCOUNTER — Other Ambulatory Visit (HOSPITAL_COMMUNITY): Payer: Self-pay

## 2023-05-21 ENCOUNTER — Ambulatory Visit: Payer: No Typology Code available for payment source | Admitting: Internal Medicine

## 2023-05-25 ENCOUNTER — Emergency Department (HOSPITAL_COMMUNITY): Payer: 59

## 2023-05-25 ENCOUNTER — Emergency Department (HOSPITAL_COMMUNITY)
Admission: EM | Admit: 2023-05-25 | Discharge: 2023-05-25 | Disposition: A | Discharge status: Home / Self Care | Payer: 59 | Attending: Emergency Medicine | Admitting: Emergency Medicine

## 2023-05-25 ENCOUNTER — Other Ambulatory Visit: Payer: Self-pay

## 2023-05-25 ENCOUNTER — Encounter (HOSPITAL_COMMUNITY): Payer: Self-pay | Admitting: Emergency Medicine

## 2023-05-25 DIAGNOSIS — M25561 Pain in right knee: Secondary | ICD-10-CM | POA: Diagnosis present

## 2023-05-25 DIAGNOSIS — S93401A Sprain of unspecified ligament of right ankle, initial encounter: Secondary | ICD-10-CM | POA: Diagnosis not present

## 2023-05-25 DIAGNOSIS — S8991XA Unspecified injury of right lower leg, initial encounter: Secondary | ICD-10-CM | POA: Diagnosis not present

## 2023-05-25 DIAGNOSIS — S838X1A Sprain of other specified parts of right knee, initial encounter: Secondary | ICD-10-CM | POA: Diagnosis not present

## 2023-05-25 DIAGNOSIS — Z043 Encounter for examination and observation following other accident: Secondary | ICD-10-CM | POA: Diagnosis not present

## 2023-05-25 DIAGNOSIS — Y9301 Activity, walking, marching and hiking: Secondary | ICD-10-CM | POA: Diagnosis not present

## 2023-05-25 DIAGNOSIS — M1711 Unilateral primary osteoarthritis, right knee: Secondary | ICD-10-CM | POA: Diagnosis not present

## 2023-05-25 DIAGNOSIS — S8391XA Sprain of unspecified site of right knee, initial encounter: Secondary | ICD-10-CM | POA: Diagnosis not present

## 2023-05-25 DIAGNOSIS — W208XXA Other cause of strike by thrown, projected or falling object, initial encounter: Secondary | ICD-10-CM | POA: Diagnosis not present

## 2023-05-25 DIAGNOSIS — M7731 Calcaneal spur, right foot: Secondary | ICD-10-CM | POA: Diagnosis not present

## 2023-05-25 DIAGNOSIS — S39012A Strain of muscle, fascia and tendon of lower back, initial encounter: Secondary | ICD-10-CM | POA: Diagnosis not present

## 2023-05-25 DIAGNOSIS — W19XXXA Unspecified fall, initial encounter: Secondary | ICD-10-CM

## 2023-05-25 MED ORDER — LIDOCAINE 5 % EX PTCH: 3.0000 | MEDICATED_PATCH | CUTANEOUS | 0 refills | Status: AC

## 2023-05-25 MED ORDER — ACETAMINOPHEN 325 MG PO TABS
650.0000 mg | ORAL_TABLET | Freq: Once | ORAL | Status: AC
  Administered 2023-05-25: 650 mg via ORAL
  Filled 2023-05-25: qty 2

## 2023-05-25 MED ORDER — CYCLOBENZAPRINE HCL 10 MG PO TABS: 10.0000 mg | ORAL_TABLET | Freq: Three times a day (TID) | ORAL | 0 refills | Status: AC | PRN

## 2023-05-25 MED ORDER — CYCLOBENZAPRINE HCL 10 MG PO TABS
10.0000 mg | ORAL_TABLET | Freq: Once | ORAL | Status: AC
  Administered 2023-05-25: 10 mg via ORAL
  Filled 2023-05-25: qty 1

## 2023-05-25 MED ORDER — ONDANSETRON 4 MG PO TBDP
4.0000 mg | ORAL_TABLET | Freq: Once | ORAL | Status: AC
  Administered 2023-05-25: 4 mg via ORAL
  Filled 2023-05-25: qty 1

## 2023-05-25 MED ORDER — HYDROMORPHONE HCL 1 MG/ML IJ SOLN
0.5000 mg | Freq: Once | INTRAMUSCULAR | Status: AC
  Administered 2023-05-25: 0.5 mg via INTRAMUSCULAR
  Filled 2023-05-25: qty 1

## 2023-05-25 MED ORDER — OXYCODONE-ACETAMINOPHEN 5-325 MG PO TABS
1.0000 | ORAL_TABLET | Freq: Once | ORAL | Status: AC
  Administered 2023-05-25: 1 via ORAL
  Filled 2023-05-25: qty 1

## 2023-05-25 NOTE — ED Triage Notes (Signed)
Pt presents after her right leg went through her deck.  Right knee, shin, and ankle pain.  No lacerations.

## 2023-05-25 NOTE — Discharge Instructions (Addendum)
Thank you for letting us take care of you today.  Your x-rays were normal. With your significant pain and difficulty bearing weight, we placed you in an immobilizer with crutches for comfort. Please utilize these until able to follow up with orthopedics.  They may want to do additional imaging if you continue to have symptoms.  I provided the orthopedic we have on call in addition to a different orthopedic clinic that you may follow-up with.  Please call and schedule an appointment at your convenience.  I recommend being seen in about 1 week for reevaluation.  You can also be reevaluated by your PCP if needed.  For new injuries or worsening symptoms, return to the nearest ED for reevaluation.

## 2023-05-25 NOTE — Progress Notes (Signed)
Orthopedic Tech Progress Note Patient Details:  Barbara Orozco 1986-01-26 [id number]  Ortho Devices Type of Ortho Device: Crutches, Knee Immobilizer Ortho Device/Splint Location: rle Ortho Device/Splint Interventions: Ordered, Application, Adjustment  I taught the patient how to apply the knee immobilizer and use the crutches they got up and walked good. Post Interventions Patient Tolerated: Well Instructions Provided: Care of device, Adjustment of device  [name] Post 05/25/2023, 11:00 PM

## 2023-05-25 NOTE — ED Provider Notes (Signed)
[place] [facility] AT [facility] Provider Note   CSN: [id number] Arrival date & time: 05/25/23  1900     History  Chief Complaint  Patient presents with   Barbara Orozco    Barbara Orozco is a 37 y.o. female with past medical history PTSD, BPD, polysubstance abuse, GERD, IBS who presents to the ED complaining of pain from her right knee down to her right foot.  She states that she was walking on her deck when a plank accidentally fell through causing her leg to become trapped in between the planks beside this.  States that the board did slightly hit her in the face but she did not lose consciousness and does not have any nausea, vision changes, or severe headache.  States that she generally feels sore all over but does not feel like she significantly injured anything apart from her right leg.  States that most of the pain is slightly proximal to the right knee as well as throughout the right ankle and to the lateral foot.  Denies chance of pregnancy.      Home Medications Prior to Admission medications   Medication Sig Start Date End Date Taking? Authorizing Provider  cyclobenzaprine (FLEXERIL) 10 MG tablet Take 1 tablet (10 mg total) by mouth 3 (three) times daily as needed for up to 3 days for muscle spasms. 05/25/23 05/28/23 Yes Tearah Saulsbury L, PA-C  lidocaine (LIDODERM) 5 % Place 3 patches onto the skin daily for 3 days. Remove & Discard patch within 12 hours or as directed by MD 05/25/23 05/28/23 Yes Jessel Gettinger L, PA-C  alosetron (LOTRONEX) 0.5 MG tablet Take 1 tablet (0.5 mg total) by mouth 2 (two) times daily. Patient taking differently: Take 0.5 mg by mouth daily. 03/06/23   Iva Boop, MD  dicyclomine (BENTYL) 10 MG capsule Take 10 mg by mouth daily as needed for spasms.    [provider]  MELATONIN PO Take 1 tablet by mouth at bedtime.    [provider]  ondansetron (ZOFRAN-ODT) 4 MG disintegrating tablet Take 1 tablet (4 mg total) by mouth  every 8 (eight) hours as needed for nausea or vomiting. 01/15/23   Iva Boop, MD  oxyCODONE (OXY IR/ROXICODONE) 5 MG immediate release tablet Take 1 tablet (5 mg total) by mouth every 6 (six) hours as needed for severe pain. 03/29/23   Romie Levee, MD  pantoprazole (PROTONIX) 40 MG tablet TAKE 1 TABLET BY MOUTH TWICE DAILY BEFORE MEAL(S) 03/06/23   Iva Boop, MD      Allergies    Bee venom, Pork allergy, Tramadol, Cinnamon, Nsaids, and Lithium    Review of Systems   Review of Systems  All other systems reviewed and are negative.   Physical Exam Updated Vital Signs BP (!) 139/97 (BP Location: Right Arm)   Pulse 77   Temp 97.8 F (36.6 C) (Oral)   Resp 18   LMP  (LMP Unknown)   SpO2 94%  Physical Exam Vitals and nursing note reviewed.  Constitutional:      General: She is not in acute distress.    Appearance: Normal appearance.  HENT:     Head: Normocephalic and atraumatic.     Mouth/Throat:     Mouth: Mucous membranes are moist.  Eyes:     Conjunctiva/sclera: Conjunctivae normal.  Cardiovascular:     Rate and Rhythm: Normal rate and regular rhythm.     Heart sounds: No murmur heard. Pulmonary:  Effort: Pulmonary effort is normal.     Breath sounds: Normal breath sounds.  Abdominal:     General: Abdomen is flat.     Palpations: Abdomen is soft.  Musculoskeletal:     Cervical back: Neck supple.     Comments: Tenderness just proximal to anterior right knee and to medial/lateral aspect of knee, joint is stable, point tenderness to anterior tib-fib approximately mid-way between knee and foot, diffuse tenderness over right ankle, tenderness over lateral aspect of right foot, 2+ DP and PT pulses, soft compartments, all joints appear stable, no open wounds, no midline CTL spinal tenderness, stepoffs, or deformities, slight tenderness over right lower back musculature  Skin:    General: Skin is warm and dry.     Capillary Refill: Capillary refill takes less than 2  seconds.  Neurological:     Mental Status: She is alert. Mental status is at baseline.  Psychiatric:        Behavior: Behavior normal.     ED Results / Procedures / Treatments   Labs (all labs ordered are listed, but only abnormal results are displayed) Labs Reviewed - No data to display  EKG None  Radiology DG Knee 2 Views Right  Result Date: 05/25/2023 CLINICAL DATA:  Fall and trauma to the right knee. EXAM: RIGHT KNEE - 1-2 VIEW COMPARISON:  None Available. FINDINGS: There is no acute fracture or dislocation. The bones are well mineralized. There is moderate arthritic changes with tricompartmental narrowing and spurring. No joint effusion. The soft tissues are unremarkable. IMPRESSION: 1. No acute fracture or dislocation. 2. Moderate arthritic changes. Electronically Signed   By: Elgie Collard M.D.   On: 05/25/2023 20:56   DG Foot 2 Views Right  Result Date: 05/25/2023 CLINICAL DATA:  Fall EXAM: RIGHT FOOT - 2 VIEW COMPARISON:  None Available. FINDINGS: There is no evidence of fracture or dislocation. There is no evidence of arthropathy. A plantar calcaneal spur is present. Soft tissues are unremarkable. IMPRESSION: 1. No acute fracture or dislocation. 2. Plantar calcaneal spur. Electronically Signed   By: Darliss Cheney M.D.   On: 05/25/2023 20:55   DG Tibia/Fibula Right  Result Date: 05/25/2023 CLINICAL DATA:  Fall and trauma to the right lower extremity. EXAM: RIGHT TIBIA AND FIBULA - 2 VIEW COMPARISON:  None Available. FINDINGS: There is no acute fracture or dislocation. The bones are well mineralized. There is moderate arthritic changes of the knee with narrowing of the medial and lateral compartments and spurring. The soft tissues are unremarkable. IMPRESSION: 1. No acute fracture or dislocation. 2. Moderate arthritic changes of the knee. Electronically Signed   By: Elgie Collard M.D.   On: 05/25/2023 19:56   DG Ankle Complete Right  Result Date: 05/25/2023 CLINICAL DATA:   Fall EXAM: RIGHT ANKLE - COMPLETE 3+ VIEW COMPARISON:  None Available. FINDINGS: There is no evidence of fracture, dislocation, or joint effusion. There is no evidence of arthropathy. Plantar calcaneal spur is present. Soft tissues are unremarkable. IMPRESSION: 1. No acute fracture or dislocation. 2. Plantar calcaneal spur. Electronically Signed   By: Darliss Cheney M.D.   On: 05/25/2023 19:55    Procedures Procedures    Medications Ordered in ED Medications  cyclobenzaprine (FLEXERIL) tablet 10 mg (has no administration in time range)  acetaminophen (TYLENOL) tablet 650 mg (has no administration in time range)  HYDROmorphone (DILAUDID) injection 0.5 mg (has no administration in time range)  oxyCODONE-acetaminophen (PERCOCET/ROXICET) 5-325 MG per tablet 1 tablet (1 tablet Oral Given 05/25/23  2018)  ondansetron (ZOFRAN-ODT) disintegrating tablet 4 mg (4 mg Oral Given 05/25/23 2018)    ED Course/ Medical Decision Making/ A&P                             Medical Decision Making Amount and/or Complexity of Data Reviewed Radiology: ordered. Decision-making details documented in ED Course.  Risk OTC drugs. Prescription drug management.   Medical Decision Making:   Makyiah Lie is a 37 y.o. female who presented to the ED today with fall detailed above.    Patient's presentation is complicated by their history of trauma.  Complete initial physical exam performed, notably the patient was in no acute distress.  No midline spinal tenderness, step-offs, or deformities.  She had multiple areas of tenderness to the right lower extremity as above but joints are stable and she is neurovascularly intact.    Reviewed and confirmed nursing documentation for past medical history, family history, social history.    Initial Assessment:   With the patient's presentation, differential diagnosis includes but is not limited to sprain, strain, contusion, fracture, dislocation, head injury.  This is most  consistent with an acute complicated illness  Initial Plan:  X-rays to assess for bony pathology Pain management (pt requested anti-emetics with pain medication) Objective evaluation as below reviewed   Initial Study Results:   Radiology:  All images reviewed independently. Agree with radiology report at this time.   DG Knee 2 Views Right  Result Date: 05/25/2023 CLINICAL DATA:  Fall and trauma to the right knee. EXAM: RIGHT KNEE - 1-2 VIEW COMPARISON:  None Available. FINDINGS: There is no acute fracture or dislocation. The bones are well mineralized. There is moderate arthritic changes with tricompartmental narrowing and spurring. No joint effusion. The soft tissues are unremarkable. IMPRESSION: 1. No acute fracture or dislocation. 2. Moderate arthritic changes. Electronically Signed   By: Elgie Collard M.D.   On: 05/25/2023 20:56   DG Foot 2 Views Right  Result Date: 05/25/2023 CLINICAL DATA:  Fall EXAM: RIGHT FOOT - 2 VIEW COMPARISON:  None Available. FINDINGS: There is no evidence of fracture or dislocation. There is no evidence of arthropathy. A plantar calcaneal spur is present. Soft tissues are unremarkable. IMPRESSION: 1. No acute fracture or dislocation. 2. Plantar calcaneal spur. Electronically Signed   By: Darliss Cheney M.D.   On: 05/25/2023 20:55   DG Tibia/Fibula Right  Result Date: 05/25/2023 CLINICAL DATA:  Fall and trauma to the right lower extremity. EXAM: RIGHT TIBIA AND FIBULA - 2 VIEW COMPARISON:  None Available. FINDINGS: There is no acute fracture or dislocation. The bones are well mineralized. There is moderate arthritic changes of the knee with narrowing of the medial and lateral compartments and spurring. The soft tissues are unremarkable. IMPRESSION: 1. No acute fracture or dislocation. 2. Moderate arthritic changes of the knee. Electronically Signed   By: Elgie Collard M.D.   On: 05/25/2023 19:56   DG Ankle Complete Right  Result Date: 05/25/2023 CLINICAL  DATA:  Fall EXAM: RIGHT ANKLE - COMPLETE 3+ VIEW COMPARISON:  None Available. FINDINGS: There is no evidence of fracture, dislocation, or joint effusion. There is no evidence of arthropathy. Plantar calcaneal spur is present. Soft tissues are unremarkable. IMPRESSION: 1. No acute fracture or dislocation. 2. Plantar calcaneal spur. Electronically Signed   By: Darliss Cheney M.D.   On: 05/25/2023 19:55      Final Assessment and Plan:   37 year old female  presents to the ED post mechanical fall for right leg pain. Mostly located to knee and ankle but also some to tib-fib and foot. NVID. Soft compartments. Joints stable. No wounds. No midline spinal tenderness, stepoffs, or deformities. Neurologically intact, denies significant head injury, LOC, n/v. Does not take anticoagulants. X-rays negative. Does have significantly increased pain with weight bearing. With this, will place in knee immobilizer and have follow up with orthopedics. Still with pain post Percocet so will give one time dose of IM pain medication, Tylenol, Flexeril. Pt expressed understanding of plan. Strict ED return precautions given, all questions answered, and stable for discharge.    Clinical Impression:  1. Fall, initial encounter   2. Strain of lumbar region, initial encounter   3. Sprain of right knee, unspecified ligament, initial encounter   4. Sprain of right ankle, unspecified ligament, initial encounter      Discharge          Final Clinical Impression(s) / ED Diagnoses Final diagnoses:  Fall, initial encounter  Strain of lumbar region, initial encounter  Sprain of right knee, unspecified ligament, initial encounter  Sprain of right ankle, unspecified ligament, initial encounter    Rx / DC Orders ED Discharge Orders          Ordered    cyclobenzaprine (FLEXERIL) 10 MG tablet  3 times daily PRN        05/25/23 2033    lidocaine (LIDODERM) 5 %  Every 24 hours        05/25/23 2033              Richardson Dopp 05/25/23 2117    Rondel Baton, MD 05/26/23 1159

## 2023-06-28 DIAGNOSIS — F902 Attention-deficit hyperactivity disorder, combined type: Secondary | ICD-10-CM | POA: Diagnosis not present

## 2023-06-28 DIAGNOSIS — F3181 Bipolar II disorder: Secondary | ICD-10-CM | POA: Diagnosis not present

## 2023-07-12 ENCOUNTER — Inpatient Hospital Stay
Admission: AD | Admit: 2023-07-12 | Discharge: 2023-07-19 | DRG: 885 | Disposition: A | Discharge status: Home / Self Care | Payer: 59 | Source: Intra-hospital | Attending: Psychiatry | Admitting: Psychiatry

## 2023-07-12 ENCOUNTER — Emergency Department
Admission: EM | Admit: 2023-07-12 | Discharge: 2023-07-12 | Disposition: A | Discharge status: Other Acute Inpatient Hospital | Payer: 59 | Attending: Emergency Medicine | Admitting: Emergency Medicine

## 2023-07-12 ENCOUNTER — Encounter: Payer: Self-pay | Admitting: Intensive Care

## 2023-07-12 ENCOUNTER — Other Ambulatory Visit: Payer: Self-pay

## 2023-07-12 DIAGNOSIS — F419 Anxiety disorder, unspecified: Secondary | ICD-10-CM | POA: Diagnosis present

## 2023-07-12 DIAGNOSIS — R451 Restlessness and agitation: Secondary | ICD-10-CM | POA: Diagnosis present

## 2023-07-12 DIAGNOSIS — Z9151 Personal history of suicidal behavior: Secondary | ICD-10-CM | POA: Diagnosis not present

## 2023-07-12 DIAGNOSIS — R45851 Suicidal ideations: Secondary | ICD-10-CM

## 2023-07-12 DIAGNOSIS — Z8616 Personal history of COVID-19: Secondary | ICD-10-CM | POA: Insufficient documentation

## 2023-07-12 DIAGNOSIS — F4311 Post-traumatic stress disorder, acute: Secondary | ICD-10-CM | POA: Diagnosis not present

## 2023-07-12 DIAGNOSIS — Z833 Family history of diabetes mellitus: Secondary | ICD-10-CM

## 2023-07-12 DIAGNOSIS — Z87891 Personal history of nicotine dependence: Secondary | ICD-10-CM | POA: Insufficient documentation

## 2023-07-12 DIAGNOSIS — K589 Irritable bowel syndrome without diarrhea: Secondary | ICD-10-CM | POA: Diagnosis present

## 2023-07-12 DIAGNOSIS — K219 Gastro-esophageal reflux disease without esophagitis: Secondary | ICD-10-CM | POA: Diagnosis not present

## 2023-07-12 DIAGNOSIS — J45909 Unspecified asthma, uncomplicated: Secondary | ICD-10-CM | POA: Diagnosis not present

## 2023-07-12 DIAGNOSIS — F332 Major depressive disorder, recurrent severe without psychotic features: Principal | ICD-10-CM | POA: Insufficient documentation

## 2023-07-12 DIAGNOSIS — G4700 Insomnia, unspecified: Secondary | ICD-10-CM | POA: Diagnosis present

## 2023-07-12 DIAGNOSIS — Z79899 Other long term (current) drug therapy: Secondary | ICD-10-CM

## 2023-07-12 DIAGNOSIS — F314 Bipolar disorder, current episode depressed, severe, without psychotic features: Principal | ICD-10-CM | POA: Diagnosis present

## 2023-07-12 DIAGNOSIS — Z8249 Family history of ischemic heart disease and other diseases of the circulatory system: Secondary | ICD-10-CM | POA: Diagnosis not present

## 2023-07-12 HISTORY — DX: Bipolar II disorder: F31.81

## 2023-07-12 LAB — URINE DRUG SCREEN, QUALITATIVE (ARMC ONLY)
Amphetamines, Ur Screen: NOT DETECTED
Barbiturates, Ur Screen: NOT DETECTED
Benzodiazepine, Ur Scrn: NOT DETECTED
Cannabinoid 50 Ng, Ur ~~LOC~~: POSITIVE — AB
Cocaine Metabolite,Ur ~~LOC~~: NOT DETECTED
MDMA (Ecstasy)Ur Screen: NOT DETECTED
Methadone Scn, Ur: NOT DETECTED
Opiate, Ur Screen: NOT DETECTED
Phencyclidine (PCP) Ur S: NOT DETECTED
Tricyclic, Ur Screen: NOT DETECTED

## 2023-07-12 LAB — POC URINE PREG, ED: Preg Test, Ur: NEGATIVE

## 2023-07-12 LAB — RAPID HIV SCREEN (HIV 1/2 AB+AG)
HIV 1/2 Antibodies: NONREACTIVE
HIV-1 P24 Antigen - HIV24: NONREACTIVE

## 2023-07-12 LAB — COMPREHENSIVE METABOLIC PANEL
ALT: 19 U/L (ref 0–44)
AST: 18 U/L (ref 15–41)
Albumin: 3.9 g/dL (ref 3.5–5.0)
Alkaline Phosphatase: 88 U/L (ref 38–126)
Anion gap: 12 (ref 5–15)
BUN: 12 mg/dL (ref 6–20)
CO2: 21 mmol/L — ABNORMAL LOW (ref 22–32)
Calcium: 9.3 mg/dL (ref 8.9–10.3)
Chloride: 106 mmol/L (ref 98–111)
Creatinine, Ser: 0.7 mg/dL (ref 0.44–1.00)
GFR, Estimated: >60 mL/min (ref >60)
Glucose, Bld: 106 mg/dL — ABNORMAL HIGH (ref 70–99)
Potassium: 3.6 mmol/L (ref 3.5–5.1)
Sodium: 139 mmol/L (ref 135–145)
Total Bilirubin: 0.6 mg/dL (ref 0.3–1.2)
Total Protein: 7.5 g/dL (ref 6.5–8.1)

## 2023-07-12 LAB — CBC
HCT: 42 % (ref 36.0–46.0)
Hemoglobin: 14 g/dL (ref 12.0–15.0)
MCH: 28.5 pg (ref 26.0–34.0)
MCHC: 33.3 g/dL (ref 30.0–36.0)
MCV: 85.5 fL (ref 80.0–100.0)
Platelets: 403 10*3/uL — ABNORMAL HIGH (ref 150–400)
RBC: 4.91 MIL/uL (ref 3.87–5.11)
RDW: 13.4 % (ref 11.5–15.5)
WBC: 10.7 10*3/uL — ABNORMAL HIGH (ref 4.0–10.5)
nRBC: 0 % (ref 0.0–0.2)

## 2023-07-12 LAB — SALICYLATE LEVEL: Salicylate Lvl: <7 mg/dL — ABNORMAL LOW (ref 7.0–30.0)

## 2023-07-12 LAB — HEPATITIS B SURFACE ANTIGEN: Hepatitis B Surface Ag: NONREACTIVE

## 2023-07-12 LAB — ACETAMINOPHEN LEVEL: Acetaminophen (Tylenol), Serum: <10 ug/mL — ABNORMAL LOW (ref 10–30)

## 2023-07-12 LAB — ETHANOL: Alcohol, Ethyl (B): <10 mg/dL (ref <10)

## 2023-07-12 LAB — HEPATITIS C ANTIBODY: HCV Ab: NONREACTIVE

## 2023-07-12 MED ORDER — AZITHROMYCIN 500 MG PO TABS
1000.0000 mg | ORAL_TABLET | Freq: Once | ORAL | Status: AC
  Administered 2023-07-12: 1000 mg via ORAL
  Filled 2023-07-12: qty 2

## 2023-07-12 MED ORDER — LIDOCAINE HCL (PF) 1 % IJ SOLN
1.0000 mL | Freq: Once | INTRAMUSCULAR | Status: AC
  Administered 2023-07-12: 1 mL
  Filled 2023-07-12: qty 5

## 2023-07-12 MED ORDER — LOPERAMIDE HCL 2 MG PO CAPS
2.0000 mg | ORAL_CAPSULE | Freq: Once | ORAL | Status: AC
  Administered 2023-07-12: 2 mg via ORAL
  Filled 2023-07-12: qty 1

## 2023-07-12 MED ORDER — DIPHENHYDRAMINE HCL 50 MG/ML IJ SOLN
50.0000 mg | Freq: Three times a day (TID) | INTRAMUSCULAR | Status: DC | PRN
  Administered 2023-07-19: 50 mg via INTRAMUSCULAR
  Filled 2023-07-12: qty 1

## 2023-07-12 MED ORDER — HYDROXYZINE HCL 25 MG PO TABS
50.0000 mg | ORAL_TABLET | Freq: Three times a day (TID) | ORAL | Status: DC | PRN
  Administered 2023-07-12: 50 mg via ORAL
  Filled 2023-07-12: qty 2

## 2023-07-12 MED ORDER — DIPHENHYDRAMINE HCL 25 MG PO CAPS
50.0000 mg | ORAL_CAPSULE | Freq: Three times a day (TID) | ORAL | Status: DC | PRN
  Administered 2023-07-13 – 2023-07-15 (×6): 50 mg via ORAL
  Filled 2023-07-12 (×6): qty 2

## 2023-07-12 MED ORDER — ACETAMINOPHEN 325 MG PO TABS
650.0000 mg | ORAL_TABLET | Freq: Once | ORAL | Status: AC
  Administered 2023-07-12: 650 mg via ORAL
  Filled 2023-07-12: qty 2

## 2023-07-12 MED ORDER — HALOPERIDOL 5 MG PO TABS
5.0000 mg | ORAL_TABLET | Freq: Three times a day (TID) | ORAL | Status: DC | PRN
  Administered 2023-07-13 – 2023-07-15 (×5): 5 mg via ORAL
  Filled 2023-07-12 (×6): qty 1

## 2023-07-12 MED ORDER — HALOPERIDOL LACTATE 5 MG/ML IJ SOLN: 5.0000 mg | Freq: Three times a day (TID) | INTRAMUSCULAR | Status: DC | PRN

## 2023-07-12 MED ORDER — LORAZEPAM 2 MG/ML IJ SOLN: 2.0000 mg | Freq: Three times a day (TID) | INTRAMUSCULAR | Status: DC | PRN

## 2023-07-12 MED ORDER — CEFTRIAXONE SODIUM 500 MG IJ SOLR
500.0000 mg | Freq: Once | INTRAMUSCULAR | Status: AC
  Administered 2023-07-12: 500 mg via INTRAMUSCULAR
  Filled 2023-07-12: qty 500

## 2023-07-12 MED ORDER — GABAPENTIN 100 MG PO CAPS: 100.0000 mg | ORAL_CAPSULE | Freq: Two times a day (BID) | ORAL | Status: DC

## 2023-07-12 MED ORDER — TRAZODONE HCL 50 MG PO TABS
50.0000 mg | ORAL_TABLET | Freq: Every evening | ORAL | Status: DC | PRN
  Administered 2023-07-13: 50 mg via ORAL
  Filled 2023-07-12: qty 1

## 2023-07-12 MED ORDER — DIAZEPAM 2 MG PO TABS
2.0000 mg | ORAL_TABLET | Freq: Once | ORAL | Status: AC
  Administered 2023-07-12: 2 mg via ORAL
  Filled 2023-07-12: qty 1

## 2023-07-12 MED ORDER — CHOLESTYRAMINE 4 G PO PACK
4.0000 g | PACK | Freq: Two times a day (BID) | ORAL | Status: DC
  Administered 2023-07-12: 4 g via ORAL
  Filled 2023-07-12: qty 1

## 2023-07-12 MED ORDER — ACETAMINOPHEN 325 MG PO TABS
650.0000 mg | ORAL_TABLET | Freq: Four times a day (QID) | ORAL | Status: DC | PRN
  Administered 2023-07-13 – 2023-07-19 (×11): 650 mg via ORAL
  Filled 2023-07-12 (×11): qty 2

## 2023-07-12 MED ORDER — LORAZEPAM 2 MG PO TABS
2.0000 mg | ORAL_TABLET | Freq: Three times a day (TID) | ORAL | Status: DC | PRN
  Administered 2023-07-13: 2 mg via ORAL
  Filled 2023-07-12: qty 1

## 2023-07-12 MED ORDER — ALUM & MAG HYDROXIDE-SIMETH 200-200-20 MG/5ML PO SUSP: 30.0000 mL | ORAL | Status: DC | PRN
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
## 2023-07-12 NOTE — ED Notes (Signed)
Pt had multi color pants, red tank top, black shirt, tie dye crocs, pink bag with flowers, and android cell phone. Pt belongings placed in belongings bag.
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
## 2023-07-12 NOTE — Progress Notes (Signed)
Pt. accepted to [facility], room to be assigned by charge nurse. Attending physician is Dr. Renato Shin. Dx is MDD.
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
## 2023-07-12 NOTE — ED Notes (Signed)
IVC envelope [id number], called magistrate [phone] pending consult
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
## 2023-07-12 NOTE — ED Notes (Signed)
Ivc/pending consult

## 2023-07-12 NOTE — ED Notes (Signed)
Attempted to call report at this time 

## 2023-07-13 ENCOUNTER — Encounter: Payer: Self-pay | Admitting: Psychiatry

## 2023-07-13 ENCOUNTER — Other Ambulatory Visit: Payer: Self-pay

## 2023-07-13 DIAGNOSIS — F314 Bipolar disorder, current episode depressed, severe, without psychotic features: Secondary | ICD-10-CM | POA: Diagnosis not present

## 2023-07-13 LAB — RPR: RPR Ser Ql: NONREACTIVE

## 2023-07-13 MED ORDER — HYDROXYZINE HCL 25 MG PO TABS
25.0000 mg | ORAL_TABLET | Freq: Four times a day (QID) | ORAL | Status: DC | PRN
  Administered 2023-07-13 (×2): 25 mg via ORAL
  Filled 2023-07-13 (×2): qty 1

## 2023-07-13 MED ORDER — TRAZODONE HCL 100 MG PO TABS
100.0000 mg | ORAL_TABLET | Freq: Every evening | ORAL | Status: DC | PRN
  Administered 2023-07-13 – 2023-07-18 (×6): 100 mg via ORAL
  Filled 2023-07-13 (×6): qty 1

## 2023-07-13 MED ORDER — ONDANSETRON 4 MG PO TBDP
4.0000 mg | ORAL_TABLET | Freq: Three times a day (TID) | ORAL | Status: DC | PRN
  Administered 2023-07-13 – 2023-07-18 (×8): 4 mg via ORAL
  Filled 2023-07-13 (×8): qty 1

## 2023-07-13 MED ORDER — LAMOTRIGINE 25 MG PO TABS
25.0000 mg | ORAL_TABLET | Freq: Every day | ORAL | Status: DC
  Administered 2023-07-13 – 2023-07-15 (×3): 25 mg via ORAL
  Filled 2023-07-13 (×3): qty 1

## 2023-07-13 MED ORDER — CLONAZEPAM 0.5 MG PO TABS
0.5000 mg | ORAL_TABLET | Freq: Two times a day (BID) | ORAL | Status: DC
  Administered 2023-07-13 – 2023-07-15 (×5): 0.5 mg via ORAL
  Filled 2023-07-13 (×5): qty 1

## 2023-07-13 MED ORDER — DICYCLOMINE HCL 10 MG PO CAPS: 10.0000 mg | ORAL_CAPSULE | Freq: Every day | ORAL | Status: DC | PRN

## 2023-07-13 MED ORDER — CHOLESTYRAMINE 4 G PO PACK
2.0000 g | PACK | Freq: Three times a day (TID) | ORAL | Status: DC
  Administered 2023-07-13 – 2023-07-16 (×6): 2 g via ORAL
  Filled 2023-07-13 (×11): qty 1

## 2023-07-13 MED ORDER — PANTOPRAZOLE SODIUM 40 MG PO TBEC
40.0000 mg | DELAYED_RELEASE_TABLET | Freq: Every day | ORAL | Status: DC
  Administered 2023-07-13 – 2023-07-19 (×8): 40 mg via ORAL
  Filled 2023-07-13 (×8): qty 1

## 2023-07-13 MED ORDER — LOPERAMIDE HCL 2 MG PO CAPS
2.0000 mg | ORAL_CAPSULE | ORAL | Status: DC | PRN
  Administered 2023-07-14: 2 mg via ORAL
  Filled 2023-07-13: qty 1

## 2023-07-13 NOTE — Plan of Care (Signed)
  Problem: Education: Goal: Knowledge of General Education information will improve Description: Including pain rating scale, medication(s)/side effects and non-pharmacologic comfort measures Outcome: Progressing   Problem: Clinical Measurements: Goal: Will remain free from infection Outcome: Progressing  Patient has insight of information.

## 2023-07-13 NOTE — Group Note (Signed)
Date:  07/13/2023 Time:  11:01 PM  Group Topic/Focus:  Healthy Communication:   The focus of this group is to discuss communication, barriers to communication, as well as healthy ways to communicate with others. Managing Feelings:   The focus of this group is to identify what feelings patients have difficulty handling and develop a plan to handle them in a healthier way upon discharge. Wrap-Up Group:   The focus of this group is to help patients review their daily goal of treatment and discuss progress on daily workbooks.    Participation Level:  Active  Participation Quality:  Appropriate  Affect:  Appropriate  Cognitive:  Alert and Appropriate  Insight: Appropriate and Good  Engagement in Group:  Engaged and Improving  Modes of Intervention:  Discussion and Education  Additional Comments:     Maglione,Cabria Micalizzi E 07/13/2023, 11:01 PM
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
## 2023-07-13 NOTE — Progress Notes (Signed)
   07/13/23 1845  Psych Admission Type (Psych Patients Only)  Admission Status Involuntary  Psychosocial Assessment  Patient Complaints Crying spells  Eye Contact Fair  Facial Expression Animated  Affect Preoccupied;Irritable  Speech Logical/coherent  Interaction Assertive  Motor Activity Restless  Appearance/Hygiene Other (Comment) (appropriate)  Behavior Characteristics Anxious;Fidgety;Irritable;Pacing  Mood Labile

## 2023-07-13 NOTE — Progress Notes (Signed)
Patient irritable this morning for not seeing a doctor since yesterday. Patient easily gets agitated for any delay in her request. Noted patient on the phone and next minute standing on the hallway crying. MD talked to patient. Medications given as per request. Patient denies SI,HI and AVH. Appetite and energy level good. Support and encouragement given.

## 2023-07-13 NOTE — [facility] Suicide Risk Assessment (Signed)
[facility] INPATIENT:  Family/Significant Other Suicide Prevention Education  Suicide Prevention Education:  Patient Refusal for Family/Significant Other Suicide Prevention Education: The patient Barbara Orozco has refused to provide written consent for family/significant other to be provided Family/Significant Other Suicide Prevention Education during admission and/or prior to discharge.  Physician notified.  Marshell Levan 07/13/2023, 3:04 PM

## 2023-07-13 NOTE — Plan of Care (Signed)
  Problem: Coping: Goal: Level of anxiety will decrease Outcome: Not Progressing   Problem: Education: Goal: Emotional status will improve Outcome: Not Progressing Goal: Mental status will improve Outcome: Not Progressing
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
## 2023-07-13 NOTE — Progress Notes (Signed)
I assumed care for Ms Barbara Orozco at about 20:00.  She was resting in the day area, watching TV with peers.  She reports that her "nerves" are tore up, reports being very anxious and is getting agitated by a peer on the unit who keeps walking into her space.  She received prn Tylenol,Benadryl, Haldol and Zofran at about 2028, effective on follow up. Later she received prn Hydroxyzine and Trazodone at about 2126. She is resting in bed at this time,in no apparent distress.Vital signs reviewed, wnl for patient. She is being monitored as ordered.

## 2023-07-13 NOTE — BHH Suicide Risk Assessment (Signed)
[facility] Admission Suicide Risk Assessment   Nursing information obtained from:  Patient Demographic factors:  Caucasian, Low socioeconomic status Current Mental Status:  Self-harm thoughts Loss Factors:  Loss of significant relationship Historical Factors:  Impulsivity Risk Reduction Factors:  Sense of responsibility to family, Positive therapeutic relationship   Principal Problem: Severe episode of recurrent major depressive disorder, without psychotic features (HCC) Diagnosis:  Principal Problem:   Severe episode of recurrent major depressive disorder, without psychotic features (HCC)  Subjective Data: Barbara Orozco is a 37 y.o. female patient admitted with suicidal ideation. Patient reports "I'm afraid to go home".   Continued Clinical Symptoms:  Alcohol Use Disorder Identification Test Final Score (AUDIT): 18 The "Alcohol Use Disorders Identification Test", Guidelines for Use in Primary Care, Second Edition.  [employer] [facility]). Score between 0-7:  no or low risk or alcohol related problems. Score between 8-15:  moderate risk of alcohol related problems. Score between 16-19:  high risk of alcohol related problems. Score 20 or above:  warrants further diagnostic evaluation for alcohol dependence and treatment.   CLINICAL FACTORS:   Depression:   Anhedonia Hopelessness Impulsivity Insomnia Alcohol/Substance Abuse/Dependencies Previous Psychiatric Diagnoses and Treatments   Musculoskeletal: Strength & Muscle Tone: within normal limits Gait & Station: normal Patient leans: N/A  Psychiatric Specialty Exam:  Presentation  General Appearance:  Appropriate for Environment  Eye Contact: Fair  Speech: Clear and Coherent  Speech Volume: Normal  Handedness:No data recorded  Mood and Affect  Mood: Anxious; Depressed  Affect: Congruent; Restricted   Thought Process  Thought Processes: Goal Directed  Descriptions of  Associations:Intact  Orientation:Full (Time, Place and Person)  Thought Content:Abstract Reasoning; Perseveration  History of Schizophrenia/Schizoaffective disorder:No  Duration of Psychotic Symptoms:No data recorded Hallucinations:Hallucinations: None  Ideas of Reference:None  Suicidal Thoughts:Suicidal Thoughts: Yes, Active SI Active Intent and/or Plan: With Plan; Without Intent  Homicidal Thoughts:Homicidal Thoughts: No   Sensorium  Memory: Immediate Fair; Recent Fair; Remote Fair  Judgment: Impaired  Insight: Shallow   Executive Functions  Concentration: Fair  Attention Span: Fair  Recall: [employer] of Knowledge: Fair  Language: Fair   Psychomotor Activity  Psychomotor Activity: Psychomotor Activity: Normal   Assets  Assets: Communication Skills; Desire for Improvement; Physical Health   Sleep  Sleep: Sleep: Poor    Physical Exam: Physical Exam Constitutional:      Appearance: Normal appearance.  HENT:     Head: Normocephalic and atraumatic.     Nose: Nose normal.  Eyes:     Pupils: Pupils are equal, round, and reactive to light.  Cardiovascular:     Rate and Rhythm: Normal rate.  Pulmonary:     Effort: Pulmonary effort is normal.  Skin:    General: Skin is warm.  Neurological:     General: No focal deficit present.     Mental Status: She is alert and oriented to person, place, and time.    Review of Systems  Constitutional:  Positive for malaise/fatigue. Negative for fever.  HENT:  Negative for congestion, hearing loss and sinus pain.   Eyes:  Negative for blurred vision and double vision.  Respiratory:  Negative for cough and shortness of breath.   Cardiovascular:  Negative for chest pain and palpitations.  Gastrointestinal:  Negative for nausea and vomiting.  Neurological:  Negative for dizziness and speech change.  Psychiatric/Behavioral:  Positive for depression, substance abuse and suicidal ideas. The patient is  nervous/anxious and has insomnia.    Blood pressure 118/88, pulse 74,  temperature 97.8 F (36.6 C), temperature source Oral, resp. rate 18, height 5\' 7"  (1.702 m), weight 127 kg, last menstrual period 07/04/2023, SpO2 98%, unknown if currently breastfeeding. Body mass index is 43.85 kg/m.   COGNITIVE FEATURES THAT CONTRIBUTE TO RISK:  Polarized thinking and Thought constriction (tunnel vision)    SUICIDE RISK:   Moderate:  Frequent suicidal ideation with limited intensity, and duration, some specificity in terms of plans, no associated intent, good self-control, some risk factors present, and identifiable protective factors, including available and accessible social support.  PLAN OF CARE: Per H&P  I certify that inpatient services furnished can reasonably be expected to improve the patient's condition.   Lewanda Rife, MD 07/13/2023, 10:57 AM

## 2023-07-13 NOTE — Group Note (Signed)
LCSW Group Therapy Note  Group Date: 07/13/2023 Start Time: 1305 End Time: 1345   Type of Therapy and Topic:  Group Therapy - Healthy vs Unhealthy Coping Skills  Participation Level:  Active   Description of Group The focus of this group was to determine what unhealthy coping techniques typically are used by group members and what healthy coping techniques would be helpful in coping with various problems. Patients were guided in becoming aware of the differences between healthy and unhealthy coping techniques. Patients were asked to identify 2-3 healthy coping skills they would like to learn to use more effectively.  Therapeutic Goals Patients learned that coping is what human beings do all day long to deal with various situations in their lives Patients defined and discussed healthy vs unhealthy coping techniques Patients identified their preferred coping techniques and identified whether these were healthy or unhealthy Patients determined 2-3 healthy coping skills they would like to become more familiar with and use more often. Patients provided support and ideas to each other   Summary of Patient Progress:  The patient attended group, but did come late. Patient proved open to input from peers and feedback from [facility]. Patient demonstrated insight into the subject matter, was respectful of peers, and participated throughout the entire session. The patient stated that alcohol is an unhealthy coping skills tends to use. The patient stated that she also uses avoidance.    Therapeutic Modalities Cognitive Behavioral Therapy   Marshell Levan, Theresia Majors 07/13/2023  3:15 PM

## 2023-07-13 NOTE — Progress Notes (Signed)
Patient admitted July 12, 2023 for thoughts to end her life by walking into traffic. Diagnosis of bipolar disorder. Reports PTSD.  She denies SI/HI/AVH. PRN's of tylenol, benadryl, hydroxyzine, zofran, and ativan. Patient complains of diarrhea. Imodium 2 mg PRN Q6h in place.  Q15 minute unit checks in place.
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
## 2023-07-13 NOTE — Plan of Care (Signed)
  Problem: Health Behavior/Discharge Planning: Goal: Ability to manage health-related needs will improve 07/13/2023 0708 by Trula Ore, RN Outcome: Progressing 07/13/2023 0702 by Trula Ore, RN Outcome: Progressing   Problem: Education: Goal: Knowledge of General Education information will improve Description: Including pain rating scale, medication(s)/side effects and non-pharmacologic comfort measures 07/13/2023 0708 by Trula Ore, RN Outcome: Progressing 07/13/2023 0702 by Trula Ore, RN Outcome: Progressing

## 2023-07-13 NOTE — Care Plan (Signed)
When pt arrived to unit her first concern was that the ER had told her she would receive the protonix she had been requesting since 11am. The ER told her that she would get it over and over and it never came. She expressed her concern that they keep bringing her meals and snacks but she can not eat because she needs her protonix. She became upset she admits. Upon this initial conversation on our unit the pt continues to explain how she does not understand why it is so hard to get an honest answer. She doesn't understand why a simple request for protonix couldn't have been given this morning and something that could have been made so simple was turned into a complete "nightmare" for her.  Pt was very anxious walking around the unit. Staff allowed pt to wait in the dayroom while the med order was being messaged to provider. RN provided a sleep aide in the meantime which the pt said she was very appreciative of. Tech provided tech with her approved belongings as well as two pairs of normal panties from our stock donations.  Pt also states she asked to speak pt advocate and the ER staff told her as soon as she got down to the inpatient psych.  Which, at 9 -10pm a pt. Advocate is not coming to see the pt Pt is very upset about the manner of the medication process and amount of "lies" she was told before coming down to our inpatient unit.

## 2023-07-14 DIAGNOSIS — F314 Bipolar disorder, current episode depressed, severe, without psychotic features: Secondary | ICD-10-CM | POA: Diagnosis not present

## 2023-07-14 MED ORDER — AZITHROMYCIN 250 MG PO TABS
250.0000 mg | ORAL_TABLET | Freq: Every day | ORAL | Status: AC
  Administered 2023-07-14 – 2023-07-18 (×5): 250 mg via ORAL
  Filled 2023-07-14 (×5): qty 1
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
## 2023-07-14 NOTE — Progress Notes (Signed)
Pt denies HI/AVH and verbally agrees to approach staff if these become apparent or before harming themselves/others. Pt endorses passive SI, with no plan. Rates depression 4/10. Rates anxiety 6/10. Rates pain 2/10.   Scheduled medications administered to pt, per MD orders. RN provided support and encouragement to pt. Q15 min safety checks implemented and continued. Pt is safe on the unit. Plan of care on going and no other concerns expressed at this time.
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
## 2023-07-14 NOTE — Group Note (Signed)
Date:  07/14/2023 Time:  12:14 PM  Group Topic/Focus:   coping with loneliness Loneliness, grief, and loss are very much related to both anxiety and depression.Group therapy activities can be very beneficial for individuals struggling with loneliness. They can provide a sense of connection, belonging, and shared interests      Participation Level:  Active  Participation Quality:  Appropriate and Attentive  Affect:  Appropriate  Cognitive:  Alert, Appropriate, and Oriented  Insight: Appropriate  Engagement in Group:  Developing/Improving  Modes of Intervention:  Activity  Additional Comments:    Dickie Cloe 07/14/2023, 12:14 PM

## 2023-07-14 NOTE — Group Note (Signed)
Date:  07/14/2023 Time:  7:03 PM  Group Topic/Focus:  Rediscovering Joy:   The focus of this group is to explore various ways to relieve stress in a positive manner.    Participation Level:  Minimal  Participation Quality:  Appropriate  Affect:  Appropriate  Cognitive:  Alert and Appropriate  Insight: Appropriate  Engagement in Group:  Developing/Improving  Modes of Intervention:  Activity  Additional Comments:    Gunnar Hereford 07/14/2023, 7:03 PM

## 2023-07-14 NOTE — Plan of Care (Signed)
  Problem: Health Behavior/Discharge Planning: Goal: Compliance with treatment plan for underlying cause of condition will improve Outcome: Progressing   Problem: Activity: Goal: Interest or engagement in activities will improve Outcome: Progressing

## 2023-07-15 DIAGNOSIS — F314 Bipolar disorder, current episode depressed, severe, without psychotic features: Secondary | ICD-10-CM

## 2023-07-15 MED ORDER — ARIPIPRAZOLE 2 MG PO TABS
2.0000 mg | ORAL_TABLET | Freq: Every day | ORAL | Status: DC
  Administered 2023-07-16 – 2023-07-19 (×4): 2 mg via ORAL
  Filled 2023-07-15 (×4): qty 1

## 2023-07-15 MED ORDER — CLONAZEPAM 0.5 MG PO TABS
0.5000 mg | ORAL_TABLET | Freq: Three times a day (TID) | ORAL | Status: DC
  Administered 2023-07-16 – 2023-07-19 (×11): 0.5 mg via ORAL
  Filled 2023-07-15 (×11): qty 1

## 2023-07-15 MED ORDER — LAMOTRIGINE 25 MG PO TABS
25.0000 mg | ORAL_TABLET | Freq: Every day | ORAL | Status: DC
  Administered 2023-07-16: 25 mg via ORAL
  Filled 2023-07-15: qty 1

## 2023-07-15 MED ORDER — LAMOTRIGINE 25 MG PO TABS: 50.0000 mg | ORAL_TABLET | Freq: Every day | ORAL | Status: DC

## 2023-07-15 NOTE — Group Note (Signed)
Recreation Therapy Group Note   Group Topic:General Recreation  Group Date: 07/15/2023 Start Time: 1000 End Time: 1100 Facilitators: Rosina Lowenstein, LRT, CTRS Location: Courtyard  Group Description: Outdoor Recreation. Patients had the option to play basketball, corn hole, or draw with chalk and listen to music while outside in the courtyard getting fresh air and sunlight. LRT and pts discussed things that they enjoy doing in their free time outside of the hospital.  Goal Area(s) Addressed: Patient will identify leisure interests.  Patient will practice healthy decision making. Patient will engage in recreation activity.   Affect/Mood: Appropriate   Participation Level: Active and Engaged   Participation Quality: Independent   Behavior: Calm and Cooperative   Speech/Thought Process: Coherent   Insight: Good   Judgement: Good   Modes of Intervention: Activity   Patient Response to Interventions:  Attentive, Engaged, Interested , and Receptive   Education Outcome:  Acknowledges education   Clinical Observations/Individualized Feedback: Barbara Orozco was active in their participation of session activities and group discussion. Pt identified "I am so tired, I feel like they're trying to drug me up since I acted out in the ED." Pt has a bright affect when interacting. Pt was observed to be singing along with the songs playing.    Plan: Continue to engage patient in RT group sessions 2-3x/week.   Rosina Lowenstein, LRT, CTRS 07/15/2023 12:27 PM
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
## 2023-07-15 NOTE — Plan of Care (Signed)
  Problem: Pain Managment: Goal: General experience of comfort will improve Outcome: Progressing   Problem: Safety: Goal: Ability to remain free from injury will improve Outcome: Progressing   Problem: Education: Goal: Emotional status will improve Outcome: Progressing Goal: Mental status will improve Outcome: Progressing   Problem: Coping: Goal: Ability to demonstrate self-control will improve Outcome: Progressing

## 2023-07-15 NOTE — Progress Notes (Signed)
Patient presents with flat affect but brightens on approach. Denies SI, Hi, AVH. Reports feeling irritated this shift, states it is not anything that we have done but something that happened earlier. Patient reports she will not take it out on this shift. Patient noted in dayroom socializing with peers and staff. No complaints voiced, denies pain. Did attend group tonight. Request only sleep meds tonight.  Encouragement and support provided. Safety checks maintained. Medications given as prescribed. Pt receptive and remains safe on unit with q 15 min checks.

## 2023-07-15 NOTE — Progress Notes (Cosign Needed Addendum)
[facility] MD Progress Note  07/15/2023 12:35 PM Barbara Orozco  MRN:  [id number]  Subjective:   37 yo female with bipolar disorder admitted with depression and suicidal ideations. Notes, vital signs, and labs reviewed.  The client stated her goal in treatment team:  "My goal is I don't want to be on this roller coaster of up and down and better manage my flashbacks."  On a one-one session, she reported "I'm not doing so good, still up and down.  The Klonopin helps but wears off midday. Then, the flashback are overwhelming and I have to take Haldol."  Depression is high with chronic suicidal ideations, mostly passive in nature. Anxiety is "not good at all".  Sleep is "not very well with the flashbacks".  Appetite is "fine".  Discussed medications and adjustments made.  Later this evening, the client got upset when her spouse could not visit as he was recently a patient here.    Principal Problem: Severe bipolar I disorder, current or most recent episode depressed (HCC) Diagnosis: Principal Problem:   Severe bipolar I disorder, current or most recent episode depressed (HCC) Active Problems:   Acute posttraumatic stress disorder  Total Time spent with patient: 30 minutes  Past Psychiatric History: bipolar d/o, cocaine abuse, BPD  Past Medical History:  Past Medical History:  Diagnosis Date   Acute posttraumatic stress disorder    Allergy    Anal fistula    Arm fracture    right and left   Asthma    Bipolar 2 disorder (HCC)    Borderline personality disorder (HCC) 05/23/2022   Cannabis use disorder    Cocaine use disorder, moderate, in early remission (HCC)    Complication of anesthesia    "wakes up angry for about 5 minutes"   Depression    Endometriosis    Endometriosis    Foot pain    right foot hurts since last night no reported injury per pt on 01-25-2022   GERD (gastroesophageal reflux disease)    Grief associated with loss of fetus    History of 2019 novel coronavirus disease  (COVID-19) 04/29/2020   + Ag test - can see in Care everywhere   History of chicken pox    had 4 or 5 times as child   History of COVID-19 04/29/2020   mild all symptoms resolved   IBS (irritable bowel syndrome)    Irritable bowel    Leg fracture, left    Psychogenic nonepileptic seizure    last seizure 1 month ago per pt on 01-25-2022 saw dr Yvone Neu neurology   Seizure [facility])    last seizure 12-28-2021 per pt on 01-25-2022   Self-injurious behavior    Shingles    intermittent outbreaks   Ulcerative colitis (HCC)    Wears glasses     Past Surgical History:  Procedure Laterality Date   APPENDECTOMY     BIOPSY  06/06/2020   Procedure: BIOPSY;  Surgeon: Iva Boop, MD;  Location: [facility] ENDOSCOPY;  Service: Endoscopy;;   COLONOSCOPY WITH PROPOFOL N/A 06/06/2020   Procedure: COLONOSCOPY WITH PROPOFOL;  Surgeon: Iva Boop, MD;  Location: [facility];  Service: Endoscopy;  Laterality: N/A;   DIAGNOSTIC LAPAROSCOPY WITH REMOVAL OF ECTOPIC PREGNANCY Right 12/11/2021   Procedure: LAPAROSCOPIC RIGHT SALPINGECTOMY WITH REMOVAL OF ECTOPIC PREGNANCY, lysis of adhesions;  Surgeon: Noland Fordyce, MD;  Location: [facility] OR;  Service: Gynecology;  Laterality: Right;   ESOPHAGOGASTRODUODENOSCOPY (EGD) WITH PROPOFOL N/A 06/06/2020   Procedure: ESOPHAGOGASTRODUODENOSCOPY (EGD) WITH  PROPOFOL;  Surgeon: Iva Boop, MD;  Location: [facility];  Service: Endoscopy;  Laterality: N/A;   HAND SURGERY Left    x3   LIGATION OF INTERNAL FISTULA TRACT N/A 03/29/2023   Procedure: LIGATION OF INTERNAL FISTULA TRACT;  Surgeon: Romie Levee, MD;  Location: [facility] ORS;  Service: General;  Laterality: N/A;   PLACEMENT OF SETON N/A 01/30/2022   Procedure: PLACEMENT OF SETON;  Surgeon: Romie Levee, MD;  Location: [facility]S;  Service: General;  Laterality: N/A;   RECTAL EXAM UNDER ANESTHESIA N/A 01/30/2022   Procedure: ANAL EXAM UNDER ANESTHESIA;  Surgeon: Romie Levee, MD;  Location: [facility] ORS;  Service: General;   Laterality: N/A;   TUBAL LIGATION     Family History:  Family History  Problem Relation Age of Onset   Colitis Mother    Ulcerative colitis Mother    Heart disease Mother    Other Mother    Colitis Father    Ulcerative colitis Father    Diabetes Father    Stomach cancer Paternal Aunt    Colitis Paternal Grandmother    Ulcerative colitis Paternal Grandmother    Colon cancer Neg Hx    Pancreatic cancer Neg Hx    Esophageal cancer Neg Hx    Family Psychiatric  History: none Social History:  Social History   Substance and Sexual Activity  Alcohol Use Yes   Alcohol/week: 6.0 standard drinks of alcohol   Types: 2 Glasses of wine, 2 Cans of beer, 2 Shots of liquor per week     Social History   Substance and Sexual Activity  Drug Use Yes   Types: Marijuana    Social History   Socioeconomic History   Marital status: Divorced    Spouse name: Not on file   Number of children: Not on file   Years of education: Not on file   Highest education level: Not on file  Occupational History    Employer: [employer]  Tobacco Use   Smoking status: Former    Types: Cigarettes   Smokeless tobacco: Never   Tobacco comments:    Social smoker quit 6-8 yrs ago per pt on 01-25-2022  Vaping Use   Vaping status: Never Used  Substance and Sexual Activity   Alcohol use: Yes    Alcohol/week: 6.0 standard drinks of alcohol    Types: 2 Glasses of wine, 2 Cans of beer, 2 Shots of liquor per week   Drug use: Yes    Types: Marijuana   Sexual activity: Yes  Other Topics Concern   Not on file  Social History Narrative   Engaged   occ EtOH, + Marijuana, no tobacco now - former   Right Handed   Drinks Caffeine    One Story Home    Social Determinants of Health   Financial Resource Strain: Not on file  Food Insecurity: No Food Insecurity (07/13/2023)   Hunger Vital Sign    Worried About Running Out of Food in the Last Year: Never true    Ran Out of Food in the Last Year: Never true   Transportation Needs: No Transportation Needs (07/13/2023)   PRAPARE - Administrator, Civil Service (Medical): No    Lack of Transportation (Non-Medical): No  Physical Activity: Not on file  Stress: Not on file (12/15/2019)  Social Connections: Not on file   Additional Social History: lives with her husband    Sleep: Poor  Appetite:  Good  Current Medications: Current Facility-Administered  Medications  Medication Dose Route Frequency Provider Last Rate Last Admin   acetaminophen (TYLENOL) tablet 650 mg  650 mg Oral Q6H PRN Penn, Cranston Neighbor, NP   650 mg at 07/15/23 0901   alum & mag hydroxide-simeth (MAALOX/MYLANTA) 200-200-20 MG/5ML suspension 30 mL  30 mL Oral Q4H PRN Penn, Cranston Neighbor, NP       azithromycin (ZITHROMAX) tablet 250 mg  250 mg Oral Daily Shaune Pollack, Sarh Kirschenbaum Y, NP   250 mg at 07/15/23 0900   cholestyramine (QUESTRAN) packet 2 g  2 g Oral TID WC Lewanda Rife, MD   2 g at 07/15/23 9147   clonazePAM (KLONOPIN) tablet 0.5 mg  0.5 mg Oral BID Lewanda Rife, MD   0.5 mg at 07/15/23 0901   dicyclomine (BENTYL) capsule 10 mg  10 mg Oral Daily PRN Lewanda Rife, MD       diphenhydrAMINE (BENADRYL) capsule 50 mg  50 mg Oral TID PRN Mcneil Sober, NP   50 mg at 07/15/23 1130   Or   diphenhydrAMINE (BENADRYL) injection 50 mg  50 mg Intramuscular TID PRN Mcneil Sober, NP       haloperidol (HALDOL) tablet 5 mg  5 mg Oral TID PRN Mcneil Sober, NP   5 mg at 07/15/23 1130   Or   haloperidol lactate (HALDOL) injection 5 mg  5 mg Intramuscular TID PRN Mcneil Sober, NP       hydrOXYzine (ATARAX) tablet 25 mg  25 mg Oral Q6H PRN Lewanda Rife, MD   25 mg at 07/13/23 2126   lamoTRIgine (LAMICTAL) tablet 25 mg  25 mg Oral Daily Lewanda Rife, MD   25 mg at 07/15/23 0901   loperamide (IMODIUM) capsule 2 mg  2 mg Oral PRN Lewanda Rife, MD   2 mg at 07/14/23 1410   ondansetron (ZOFRAN-ODT) disintegrating tablet 4 mg  4 mg Oral Q8H PRN Lewanda Rife, MD   4 mg at 07/15/23  1130   pantoprazole (PROTONIX) EC tablet 40 mg  40 mg Oral Daily Ajibola, Ene A, NP   40 mg at 07/15/23 0901   traZODone (DESYREL) tablet 100 mg  100 mg Oral QHS PRN Lewanda Rife, MD   100 mg at 07/14/23 2100    Lab Results:  No results found for this or any previous visit (from the past 48 hour(s)).   Blood Alcohol level:  Lab Results  Component Value Date   ETH <10 07/12/2023    Metabolic Disorder Labs: Lab Results  Component Value Date   HGBA1C 5.2 05/23/2022   MPG 102.54 05/23/2022   No results found for: "PROLACTIN" Lab Results  Component Value Date   CHOL 279 (H) 05/23/2022   TRIG 156 (H) 05/23/2022   HDL 28 (L) 05/23/2022   CHOLHDL 10.0 05/23/2022   VLDL 31 05/23/2022   LDLCALC 220 (H) 05/23/2022     Musculoskeletal: Strength & Muscle Tone: within normal limits Gait & Station: normal Patient leans: N/A  Psychiatric Specialty Exam: Physical Exam Vitals and nursing note reviewed.  Constitutional:      Appearance: Normal appearance.  HENT:     Head: Normocephalic.     Nose: Nose normal.  Pulmonary:     Effort: Pulmonary effort is normal.  Musculoskeletal:        General: Normal range of motion.     Cervical back: Normal range of motion.  Neurological:     General: No focal deficit present.     Mental Status: She is alert and oriented to person, place,  and time.     Review of Systems  Psychiatric/Behavioral:  Positive for depression. The patient is nervous/anxious.   All other systems reviewed and are negative.   Blood pressure 115/71, pulse 70, temperature 97.7 F (36.5 C), temperature source Oral, resp. rate 14, height 5\' 7"  (1.702 m), weight 127 kg, last menstrual period 07/04/2023, SpO2 98%, unknown if currently breastfeeding.Body mass index is 43.85 kg/m.  General Appearance: Casual  Eye Contact:  Good  Speech:  Normal Rate  Volume:  Normal  Mood:  Anxious and Depressed  Affect:  Appropriate  Thought Process:  Coherent  Orientation:   Full (Time, Place, and Person)  Thought Content:  Logical  Suicidal Thoughts:  Yes.  without intent/plan  Homicidal Thoughts:  No  Memory:  Immediate;   Good Recent;   Good Remote;   Good  Judgement:  Good  Insight:  Good  Psychomotor Activity:  Normal  Concentration:  Concentration: Good and Attention Span: Good  Recall:  Good  Fund of Knowledge:  Good  Language:  Good  Akathisia:  NA  Handed:  Right  AIMS (if indicated):     Assets:  Communication Skills Desire for Improvement Housing Leisure Time Physical Health Resilience Social Support Transportation  ADL's:  Intact  Cognition:  WNL  Sleep:  poor      Physical Exam: Physical Exam Vitals and nursing note reviewed.  Constitutional:      Appearance: Normal appearance.  HENT:     Head: Normocephalic.     Nose: Nose normal.  Pulmonary:     Effort: Pulmonary effort is normal.  Musculoskeletal:        General: Normal range of motion.     Cervical back: Normal range of motion.  Neurological:     General: No focal deficit present.     Mental Status: She is alert and oriented to person, place, and time.    Review of Systems  Psychiatric/Behavioral:  Positive for depression. The patient is nervous/anxious.   All other systems reviewed and are negative.  Blood pressure 115/71, pulse 70, temperature 97.7 F (36.5 C), temperature source Oral, resp. rate 14, height 5\' 7"  (1.702 m), weight 127 kg, last menstrual period 07/04/2023, SpO2 98%, unknown if currently breastfeeding. Body mass index is 43.85 kg/m.  Treatment Plan Summary: Daily contact with patient to assess and evaluate symptoms and progress in treatment, Medication management, and Plan : Bipolar affective disorder, depressed, severe without psychosis: Patient has been admitted to locked unit under safety precautions We will start on Lamictal 25 mg by mouth daily, side effect of medicine including rash discussed with the patient.  The client requested an  antidepressant, discussed options and considered Lexapro but only on 25 mg of Lamictal started on admission.  Decided on Abilify 2 mg daily to prevent mania behaviors especially after her reaction this evening. Lipid panel, TSH, A1C, and EKG ordered. We will continue home medications including Bentyl, colestyramine, Protonix We will start on Klonopin 0.5 mg by mouth two times a day for anxiety increased to 3 times daily, patient was encouraged not to abuse this medicine or consume alcohol while on this medicine Patient was encouraged to attend groups and work on coping strategies We will start on a mood stabilizer after patient confirms the name of the medicine Will consult social worker to get collateral and help with a safe discharge plan Will recommend psychotherapy upon discharge for depression and PTSD   Nanine Means, NP 07/15/2023, 12:35 PM

## 2023-07-15 NOTE — BH IP Treatment Plan (Signed)
Interdisciplinary Treatment and Diagnostic Plan Update  07/15/2023 Time of Session: 1"10PM Barbara Orozco MRN: [id number]  Principal Diagnosis: Severe bipolar I disorder, current or most recent episode depressed (HCC)  Secondary Diagnoses: Principal Problem:   Severe bipolar I disorder, current or most recent episode depressed (HCC) Active Problems:   Acute posttraumatic stress disorder   Current Medications:  Current Facility-Administered Medications  Medication Dose Route Frequency Provider Last Rate Last Admin   acetaminophen (TYLENOL) tablet 650 mg  650 mg Oral Q6H PRN Penn, Cranston Neighbor, NP   650 mg at 07/15/23 0901   alum & mag hydroxide-simeth (MAALOX/MYLANTA) 200-200-20 MG/5ML suspension 30 mL  30 mL Oral Q4H PRN Penn, Cranston Neighbor, NP       azithromycin (ZITHROMAX) tablet 250 mg  250 mg Oral Daily Shaune Pollack, Jamison Y, NP   250 mg at 07/15/23 0900   cholestyramine (QUESTRAN) packet 2 g  2 g Oral TID WC Lewanda Rife, MD   2 g at 07/15/23 4098   clonazePAM (KLONOPIN) tablet 0.5 mg  0.5 mg Oral BID Lewanda Rife, MD   0.5 mg at 07/15/23 0901   dicyclomine (BENTYL) capsule 10 mg  10 mg Oral Daily PRN Lewanda Rife, MD       diphenhydrAMINE (BENADRYL) capsule 50 mg  50 mg Oral TID PRN Mcneil Sober, NP   50 mg at 07/15/23 1130   Or   diphenhydrAMINE (BENADRYL) injection 50 mg  50 mg Intramuscular TID PRN Mcneil Sober, NP       haloperidol (HALDOL) tablet 5 mg  5 mg Oral TID PRN Mcneil Sober, NP   5 mg at 07/15/23 1130   Or   haloperidol lactate (HALDOL) injection 5 mg  5 mg Intramuscular TID PRN Mcneil Sober, NP       hydrOXYzine (ATARAX) tablet 25 mg  25 mg Oral Q6H PRN Lewanda Rife, MD   25 mg at 07/13/23 2126   lamoTRIgine (LAMICTAL) tablet 25 mg  25 mg Oral Daily Lewanda Rife, MD   25 mg at 07/15/23 0901   loperamide (IMODIUM) capsule 2 mg  2 mg Oral PRN Lewanda Rife, MD   2 mg at 07/14/23 1410   ondansetron (ZOFRAN-ODT) disintegrating tablet 4 mg  4 mg Oral Q8H PRN  Lewanda Rife, MD   4 mg at 07/15/23 1130   pantoprazole (PROTONIX) EC tablet 40 mg  40 mg Oral Daily Ajibola, Ene A, NP   40 mg at 07/15/23 0901   traZODone (DESYREL) tablet 100 mg  100 mg Oral QHS PRN Lewanda Rife, MD   100 mg at 07/14/23 2100   PTA Medications: Medications Prior to Admission  Medication Sig Dispense Refill Last Dose   alosetron (LOTRONEX) 0.5 MG tablet Take 1 tablet (0.5 mg total) by mouth 2 (two) times daily. 60 tablet 2    cholestyramine (QUESTRAN) 4 GM/DOSE powder Take 2 g by mouth 3 (three) times daily with meals.      dicyclomine (BENTYL) 10 MG capsule Take 10 mg by mouth daily as needed for spasms.      MELATONIN PO Take 1 tablet by mouth at bedtime.      ondansetron (ZOFRAN-ODT) 4 MG disintegrating tablet Take 1 tablet (4 mg total) by mouth every 8 (eight) hours as needed for nausea or vomiting. 30 tablet 5    oxyCODONE (OXY IR/ROXICODONE) 5 MG immediate release tablet Take 1 tablet (5 mg total) by mouth every 6 (six) hours as needed for severe pain. (Patient not taking: Reported on 07/12/2023) 20 tablet  0    pantoprazole (PROTONIX) 40 MG tablet TAKE 1 TABLET BY MOUTH TWICE DAILY BEFORE MEAL(S) 180 tablet 3     Patient Stressors:    Patient Strengths:    Treatment Modalities: Medication Management, Group therapy, Case management,  1 to 1 session with clinician, Psychoeducation, Recreational therapy.   Physician Treatment Plan for Primary Diagnosis: Severe bipolar I disorder, current or most recent episode depressed (HCC) Long Term Goal(s): Improvement in symptoms so as ready for discharge   Short Term Goals: Ability to identify changes in lifestyle to reduce recurrence of condition will improve Ability to verbalize feelings will improve Ability to disclose and discuss suicidal ideas Ability to demonstrate self-control will improve Ability to identify and develop effective coping behaviors will improve Ability to maintain clinical measurements within  normal limits will improve Compliance with prescribed medications will improve Ability to identify triggers associated with substance abuse/mental health issues will improve  Medication Management: Evaluate patient's response, side effects, and tolerance of medication regimen.  Therapeutic Interventions: 1 to 1 sessions, Unit Group sessions and Medication administration.  Evaluation of Outcomes: Progressing  Physician Treatment Plan for Secondary Diagnosis: Principal Problem:   Severe bipolar I disorder, current or most recent episode depressed (HCC) Active Problems:   Acute posttraumatic stress disorder  Long Term Goal(s): Improvement in symptoms so as ready for discharge   Short Term Goals: Ability to identify changes in lifestyle to reduce recurrence of condition will improve Ability to verbalize feelings will improve Ability to disclose and discuss suicidal ideas Ability to demonstrate self-control will improve Ability to identify and develop effective coping behaviors will improve Ability to maintain clinical measurements within normal limits will improve Compliance with prescribed medications will improve Ability to identify triggers associated with substance abuse/mental health issues will improve     Medication Management: Evaluate patient's response, side effects, and tolerance of medication regimen.  Therapeutic Interventions: 1 to 1 sessions, Unit Group sessions and Medication administration.  Evaluation of Outcomes: Progressing   RN Treatment Plan for Primary Diagnosis: Severe bipolar I disorder, current or most recent episode depressed (HCC) Long Term Goal(s): Knowledge of disease and therapeutic regimen to maintain health will improve  Short Term Goals: Ability to demonstrate self-control, Ability to participate in decision making will improve, Ability to verbalize feelings will improve, Ability to disclose and discuss suicidal ideas, Ability to identify and develop  effective coping behaviors will improve, and Compliance with prescribed medications will improve  Medication Management: RN will administer medications as ordered by provider, will assess and evaluate patient's response and provide education to patient for prescribed medication. RN will report any adverse and/or side effects to prescribing provider.  Therapeutic Interventions: 1 on 1 counseling sessions, Psychoeducation, Medication administration, Evaluate responses to treatment, Monitor vital signs and CBGs as ordered, Perform/monitor CIWA, COWS, AIMS and Fall Risk screenings as ordered, Perform wound care treatments as ordered.  Evaluation of Outcomes: Progressing   LCSW Treatment Plan for Primary Diagnosis: Severe bipolar I disorder, current or most recent episode depressed (HCC) Long Term Goal(s): Safe transition to appropriate next level of care at discharge, Engage patient in therapeutic group addressing interpersonal concerns.  Short Term Goals: Engage patient in aftercare planning with referrals and resources, Increase social support, Increase ability to appropriately verbalize feelings, Increase emotional regulation, Facilitate acceptance of mental health diagnosis and concerns, and Increase skills for wellness and recovery  Therapeutic Interventions: Assess for all discharge needs, 1 to 1 time with Social worker, Explore available resources and support  systems, Assess for adequacy in community support network, Educate family and significant other(s) on suicide prevention, Complete Psychosocial Assessment, Interpersonal group therapy.  Evaluation of Outcomes: Progressing   Progress in Treatment: Attending groups: Yes. Participating in groups: Yes. Taking medication as prescribed: Yes. Toleration medication: Yes. Family/Significant other contact made: No, will contact:  once permission has been given. Patient understands diagnosis: Yes. Discussing patient identified problems/goals  with staff: Yes. Medical problems stabilized or resolved: Yes. Denies suicidal/homicidal ideation: Yes. Issues/concerns per patient self-inventory: No. Other: none  New problem(s) identified: No, Describe:  none  New Short Term/Long Term Goal(s): detox, elimination of symptoms of psychosis, medication management for mood stabilization; elimination of SI thoughts; development of comprehensive mental wellness/sobriety plan.   Patient Goals:  "I don't want to be on this roller coaster of ups and downs"  Discharge Plan or Barriers: CSW to assist in the development of apprpriate discharge plans.  CSW to assist patient in identifying appropriate aftercare providers, pt has indicated a desire to be referred for therapy.  Patient reports that she has a medication management provider.  Reason for Continuation of Hospitalization: Anxiety Depression Medical Issues Medication stabilization Suicidal ideation  Estimated Length of Stay:  1-7 days  Last 3 [place] Suicide Severity Risk Score: Flowsheet Row Admission (Current) from 07/12/2023 in [facility]PATIENT BE[facility]INE Most recent reading at 07/13/2023  1:56 AM ED from 07/12/2023 in [facility] Emergency Department at [facility] Most recent reading at 07/12/2023  5:28 PM ED from 05/25/2023 in [facility] Emergency Department at [facility] Most recent reading at 05/25/2023  7:08 PM  C-SSRS RISK CATEGORY High Risk High Risk No Risk       Last PHQ 2/9 Scores:    05/23/2022    4:39 PM 07/06/2021    8:35 AM  Depression screen PHQ 2/9  Decreased Interest 2 0  Down, Depressed, Hopeless 2 0  PHQ - 2 Score 4 0  Altered sleeping 2   Tired, decreased energy 2   Change in appetite 2   Feeling bad or failure about yourself  2   Trouble concentrating 2   Moving slowly or fidgety/restless 2   Suicidal thoughts 2   PHQ-9 Score 18   Difficult doing work/chores Very difficult     Scribe for Treatment Team: Harden Mo,  LCSW 07/15/2023 1:25 PM

## 2023-07-15 NOTE — Group Note (Signed)
Date:  07/15/2023 Time:  9:47 PM  Group Topic/Focus:  Wrap-Up Group:   The focus of this group is to help patients review their daily goal of treatment and discuss progress on daily workbooks.    Participation Level:  Active  Participation Quality:  Appropriate, Attentive, and Sharing  Affect:  Anxious, Excited, and Tearful  Cognitive:  Alert, Appropriate, and Lacking  Insight: Good and Improving  Engagement in Group:  Engaged and Improving  Modes of Intervention:  Discussion and Education  Additional Comments:     Katina Dung 07/15/2023, 9:47 PM

## 2023-07-15 NOTE — Progress Notes (Signed)
Patient had a visitor who was a patient here 2 weeks ago.Staff told patient that as per the policy he cannot visit now. Patient started screaming and throw the water cups on the floor.PRN medications given and patient calm down.

## 2023-07-15 NOTE — Plan of Care (Signed)
  Problem: Coping: Goal: Level of anxiety will decrease Outcome: Progressing   Problem: Pain Managment: Goal: General experience of comfort will improve Outcome: Progressing  Pin appears less angry, able to verbalize her concerns and feelings to staff.

## 2023-07-15 NOTE — Group Note (Signed)
Date:  07/15/2023 Time:  5:22 PM  Group Topic/Focus:  Activity Group:  The focus of this group is to encourage patiens to go outside to the courtyard and get some fresh air and some exercise.     Participation Level:  Active  Participation Quality:  Appropriate  Affect:  Appropriate  Cognitive:  Alert  Insight: Appropriate  Engagement in Group:  Engaged  Modes of Intervention:  Activity  Additional Comments:    Barbara Orozco 07/15/2023, 5:22 PM

## 2023-07-15 NOTE — Progress Notes (Signed)
Patient's husband had to be turned away from visitation due to the fact that he was a patient on the unit last week. Patient stated that she was told that he could visit being that he was her husband. This Clinical research associate explained to both patient and her husband that the unit policy is that a previous patient can not visit on the unit for thirty days after being discharged. Patient and husband accepted this news and he cooperated with staff and left. Patient then got upset and went into the medication room with her nurse and was seen coming out of the medication room screaming going down the hallway. Patient then went into her room and started yelling louder and threw her cups out into the hallway. Patient's nurse went to to give patient PRN anxiety medication to help with relief.

## 2023-07-15 NOTE — Group Note (Signed)
[facility] LCSW Group Therapy Note    Group Date: 07/15/2023 Start Time: 1415 End Time: 1500  Type of Therapy and Topic:  Group Therapy:  Overcoming Obstacles  Participation Level:  [facility] PARTICIPATION LEVEL: Did Not Attend  Mood:  Description of Group:   In this group patients will be encouraged to explore what they see as obstacles to their own wellness and recovery. They will be guided to discuss their thoughts, feelings, and behaviors related to these obstacles. The group will process together ways to cope with barriers, with attention given to specific choices patients can make. Each patient will be challenged to identify changes they are motivated to make in order to overcome their obstacles. This group will be process-oriented, with patients participating in exploration of their own experiences as well as giving and receiving support and challenge from other group members.  Therapeutic Goals: 1. Patient will identify personal and current obstacles as they relate to admission. 2. Patient will identify barriers that currently interfere with their wellness or overcoming obstacles.  3. Patient will identify feelings, thought process and behaviors related to these barriers. 4. Patient will identify two changes they are willing to make to overcome these obstacles:    Summary of Patient Progress   X   Therapeutic Modalities:   Cognitive Behavioral Therapy Solution Focused Therapy Motivational Interviewing Relapse Prevention Therapy   Harden Mo, LCSW

## 2023-07-15 NOTE — Group Note (Deleted)

## 2023-07-15 NOTE — Progress Notes (Signed)
Patient noted to have itchy scaly area to buttocks. Requested prn for itching. Awaiting new orders.

## 2023-07-16 DIAGNOSIS — F314 Bipolar disorder, current episode depressed, severe, without psychotic features: Secondary | ICD-10-CM | POA: Diagnosis not present

## 2023-07-16 LAB — LIPID PANEL
Cholesterol: 189 mg/dL (ref 0–200)
HDL: 38 mg/dL — ABNORMAL LOW (ref >40)
LDL Cholesterol: 103 mg/dL — ABNORMAL HIGH (ref 0–99)
Total CHOL/HDL Ratio: 5 ratio
Triglycerides: 239 mg/dL — ABNORMAL HIGH (ref <150)
VLDL: 48 mg/dL — ABNORMAL HIGH (ref 0–40)

## 2023-07-16 LAB — TSH: TSH: 1.916 u[IU]/mL (ref 0.350–4.500)

## 2023-07-16 MED ORDER — BUSPIRONE HCL 5 MG PO TABS
5.0000 mg | ORAL_TABLET | Freq: Every day | ORAL | Status: DC
  Administered 2023-07-16 – 2023-07-19 (×4): 5 mg via ORAL
  Filled 2023-07-16 (×4): qty 1

## 2023-07-16 MED ORDER — CHOLESTYRAMINE 4 G PO PACK
2.0000 g | PACK | Freq: Two times a day (BID) | ORAL | Status: DC
  Administered 2023-07-16 – 2023-07-18 (×4): 2 g via ORAL
  Filled 2023-07-16 (×7): qty 1

## 2023-07-16 MED ORDER — HYDROCORTISONE 1 % EX CREA: TOPICAL_CREAM | Freq: Two times a day (BID) | CUTANEOUS | Status: DC | PRN

## 2023-07-16 MED ORDER — LAMOTRIGINE 25 MG PO TABS
50.0000 mg | ORAL_TABLET | Freq: Every day | ORAL | Status: DC
  Administered 2023-07-17 – 2023-07-19 (×3): 50 mg via ORAL
  Filled 2023-07-16 (×3): qty 2

## 2023-07-16 MED ORDER — HYDROXYZINE HCL 50 MG PO TABS
50.0000 mg | ORAL_TABLET | Freq: Four times a day (QID) | ORAL | Status: DC | PRN
  Administered 2023-07-16 – 2023-07-19 (×6): 50 mg via ORAL
  Filled 2023-07-16 (×5): qty 1

## 2023-07-16 MED ORDER — PHENYLEPHRINE-MINERAL OIL-PET 0.25-14-74.9 % RE OINT
1.0000 | TOPICAL_OINTMENT | Freq: Two times a day (BID) | RECTAL | Status: DC | PRN
  Filled 2023-07-16: qty 57

## 2023-07-16 NOTE — Progress Notes (Signed)
Patient presents pleasant and cooperative. Complaints of headache, insomnia and anxiety. Prn given with some relief. Reports peer on unit causes her great anxiety. Denies SI, HI AVH. Medication compliant. Did attend group. No irritability this shift. Encouragement and support provided. Safety checks maintained. Medications given as prescribed. Pt receptive and remains safe on unit with q 15 min checks.

## 2023-07-16 NOTE — Group Note (Signed)
  Date:  07/16/2023 Time:  4:17 PM  Group Topic/Focus:  Outdoor recreation. Music Therapy Rapport building.    Participation Level:  Active  Participation Quality:  Appropriate and Attentive  Affect:  Appropriate  Cognitive:  Alert and Appropriate  Insight: Appropriate  Engagement in Group:  Developing/Improving and Engaged  Modes of Intervention:  Activity, Discussion, Rapport Building, and Socialization  Additional Comments:    Rosaura Carpenter 07/16/2023, 4:17 PM

## 2023-07-16 NOTE — Progress Notes (Signed)
   07/16/23 1100  Psych Admission Type (Psych Patients Only)  Admission Status Involuntary  Psychosocial Assessment  Patient Complaints Anxiety;Agitation;Irritability  Eye Contact Fair  Facial Expression Anxious;Angry  Affect Angry;Anxious;Irritable  Speech Logical/coherent  Interaction Assertive;Demanding  Motor Activity Slow  Appearance/Hygiene Unremarkable  Behavior Characteristics Anxious  Mood Anxious;Angry  Thought Process  Coherency WDL  Content WDL  Delusions None reported or observed  Perception WDL  Hallucination None reported or observed  Judgment Impaired  Confusion WDL  Danger to Self  Current suicidal ideation? Denies  Danger to Others  Danger to Others None reported or observed   Appears irritated today due requesting Haldol for anxiety. This nurse stated that her order of haldol is for agitation. Respectfully spoke to patient that the provided will be notified is she want her to get it for anxiety as well. Hydroxyzine was offered as ordered for anxiety but patient refused and was stormed out of medication room.  Provider was notified and had a discussion with patient. Patient appears to be calm at this time.

## 2023-07-16 NOTE — Group Note (Signed)
Recreation Therapy Group Note   Group Topic:Problem Solving  Group Date: 07/16/2023 Start Time: 1000 End Time: 1055 Facilitators: Rosina Lowenstein, LRT, CTRS Location: Craft Room  Group Description: Life Boat. Patients were given the scenario that they are on a boat that is about to become shipwrecked, leaving them stranded on an [place]. They are asked to make a list of 15 different items that they want to take with them when they are stranded on the [place]. Patients are asked to rank their items from most important to least important, #1 being the most important and #15 being the least. Patients will work individually for the first round to come up with 15 items and then pair up with a peer(s) to condense their list and come up with one list of 15 items between the two of them. Patients or LRT will read aloud the 15 different items to the group after each round. LRT facilitated post-activity processing to discuss how this activity can be used in daily life post discharge.   Goal Area(s) Addressed:  Patient will identify priorities, wants and needs. Patient will communicate with LRT and peers. Patient will work collectively as a Administrator, Civil Service. Patient will work on Product manager.    Affect/Mood: Appropriate   Participation Level: Active and Engaged   Participation Quality: Independent   Behavior: Appropriate, Calm, and Cooperative   Speech/Thought Process: Coherent   Insight: Good   Judgement: Good   Modes of Intervention: Activity   Patient Response to Interventions:  Attentive, Engaged, Interested , and Receptive   Education Outcome:  Acknowledges education   Clinical Observations/Individualized Feedback: Barbara Orozco was active in their participation of session activities and group discussion. Pt identified "fishing rod, rope, knife, rice, sleeping bag and jacket" as some of the items she wants to bring with her on the [place]. Pt interacted well with LRT and peers duration of  session.   Plan: Continue to engage patient in RT group sessions 2-3x/week.   Rosina Lowenstein, LRT, CTRS 07/16/2023 11:15 AM

## 2023-07-16 NOTE — Group Note (Signed)
[facility] LCSW Group Therapy Note   Group Date: 07/16/2023 Start Time: 1300 End Time: 1400  Type of Therapy/Topic:  Group Therapy:  Feelings about Diagnosis  Participation Level:  Active    Description of Group:    This group will allow patients to explore their thoughts and feelings about diagnoses they have received. Patients will be guided to explore their level of understanding and acceptance of these diagnoses. Facilitator will encourage patients to process their thoughts and feelings about the reactions of others to their diagnosis, and will guide patients in identifying ways to discuss their diagnosis with significant others in their lives. This group will be process-oriented, with patients participating in exploration of their own experiences as well as giving and receiving support and challenge from other group members.   Therapeutic Goals: 1. Patient will demonstrate understanding of diagnosis as evidence by identifying two or more symptoms of the disorder:  2. Patient will be able to express two feelings regarding the diagnosis 3. Patient will demonstrate ability to communicate their needs through discussion and/or role plays  Summary of Patient Progress: Patient was present for the entirety of the group process. She defined diagnosis as giving a name to something that is going on with someone. Pt shared that finding out her diagnosis was a positive thing for her. She stated that her support system is generally great. However, she did acknowledge that her best friend basically cut off contact with her after finding out about her diagnosis. Pt shared that most she finds knowing her mental health diagnosis is beneficial. She also spoke about the stigma attached to mental health diagnosis compared to that of a physical health diagnosis.    Therapeutic Modalities:   Cognitive Behavioral Therapy Brief Therapy Feelings Identification    Glenis Smoker, LCSW
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
## 2023-07-16 NOTE — Group Note (Signed)
Date:  07/16/2023 Time:  9:45 PM  Group Topic/Focus:  Personal Choices and Values:   The focus of this group is to help patients assess and explore the importance of values in their lives, how their values affect their decisions, how they express their values and what opposes their expression.    Participation Level:  Active  Participation Quality:  Appropriate and Attentive  Affect:  Appropriate and Excited  Cognitive:  Alert and Appropriate  Insight: Appropriate, Good, and Improving  Engagement in Group:  Developing/Improving and Engaged  Modes of Intervention:  Activity, Clarification, Discussion, Education, Limit-setting, Problem-solving, Rapport Building, Dance movement psychotherapist, Socialization, and Support  Additional Comments:     Ashish Rossetti 07/16/2023, 9:45 PM

## 2023-07-16 NOTE — Group Note (Signed)
Date:  07/16/2023 Time:  10:12 AM  Group Topic/Focus:  Goals Group:   The focus of this group is to help patients establish daily goals to achieve during treatment and discuss how the patient can incorporate goal setting into their daily lives to aide in recovery. Outdoor Recreation Structured Pharmacist, community.    Participation Level:  Active  Participation Quality:  Appropriate and Attentive  Affect:  Appropriate  Cognitive:  Alert and Appropriate  Insight: Appropriate  Engagement in Group:  Developing/Improving and Engaged  Modes of Intervention:  Activity, Discussion, Education, and Socialization  Additional Comments:    Rosaura Carpenter 07/16/2023, 10:12 AM

## 2023-07-17 DIAGNOSIS — F314 Bipolar disorder, current episode depressed, severe, without psychotic features: Secondary | ICD-10-CM | POA: Diagnosis not present

## 2023-07-17 LAB — HEMOGLOBIN A1C
Hgb A1c MFr Bld: 5.4 % (ref 4.8–5.6)
Mean Plasma Glucose: 108 mg/dL

## 2023-07-17 NOTE — Group Note (Signed)
LCSW Group Therapy Note  Group Date: 07/17/2023 Start Time: 1330 End Time: 1430   Type of Therapy and Topic:  Group Therapy: Anger Cues and Responses  Participation Level:  Active   Description of Group:   In this group, patients learned how to recognize the physical, cognitive, emotional, and behavioral responses they have to anger-provoking situations.  They identified a recent time they became angry and how they reacted.  They analyzed how their reaction was possibly beneficial and how it was possibly unhelpful.  The group discussed a variety of healthier coping skills that could help with such a situation in the future.  Focus was placed on how helpful it is to recognize the underlying emotions to our anger, because working on those can lead to a more permanent solution as well as our ability to focus on the important rather than the urgent.  Therapeutic Goals: Patients will remember their last incident of anger and how they felt emotionally and physically, what their thoughts were at the time, and how they behaved. Patients will identify how their behavior at that time worked for them, as well as how it worked against them. Patients will explore possible new behaviors to use in future anger situations. Patients will learn that anger itself is normal and cannot be eliminated, and that healthier reactions can assist with resolving conflict rather than worsening situations.  Summary of Patient Progress:   Patient was active during the group. Patient was able to engage in a discussion on triggers and how they have impacted her.  Patient was receptive and supportive of others group members.  She demonstrated fair insight into the subject matter, was respectful of peers, and participated throughout the entire session.  Therapeutic Modalities:   Cognitive Behavioral Therapy    Harden Mo, LCSW 07/17/2023  3:44 PM

## 2023-07-17 NOTE — Plan of Care (Signed)
Patient states " I feel much better with my mood today. I am happy with my new medicine."  Patient apologized for her action on Monday. Patient pleasant and cooperative on approach. Denies SI,HI and AVH. Visible in the milieu. Appetite and energy level good.Support and encouragement given.

## 2023-07-17 NOTE — Progress Notes (Signed)
[facility] MD Progress Note  07/17/2023 1:17 PM Barbara Orozco  MRN:  [id number]  Subjective:  Pt chart reviewed, discussed with interdisciplinary team, and seen on rounds. Reports mood today is "fantastic". Denies suicidal, homicidal ideations. Denies auditory visual hallucinations or paranoia. Reports sleep and appetite are good. Feels that current medication regimen is working for her and would like to keep medications the same.   Principal Problem: Severe bipolar I disorder, current or most recent episode depressed (HCC)  Diagnosis: Principal Problem:   Severe bipolar I disorder, current or most recent episode depressed (HCC) Active Problems:   Acute posttraumatic stress disorder  Total Time spent with patient:  25 minutes  Past Psychiatric History: bipolar d/o, cocaine abuse, bpd  Past Medical History:  Past Medical History:  Diagnosis Date   Acute posttraumatic stress disorder    Allergy    Anal fistula    Arm fracture    right and left   Asthma    Bipolar 2 disorder (HCC)    Borderline personality disorder (HCC) 05/23/2022   Cannabis use disorder    Cocaine use disorder, moderate, in early remission (HCC)    Complication of anesthesia    "wakes up angry for about 5 minutes"   Depression    Endometriosis    Endometriosis    Foot pain    right foot hurts since last night no reported injury per pt on 01-25-2022   GERD (gastroesophageal reflux disease)    Grief associated with loss of fetus    History of 2019 novel coronavirus disease (COVID-19) 04/29/2020   + Ag test - can see in Care everywhere   History of chicken pox    had 4 or 5 times as child   History of COVID-19 04/29/2020   mild all symptoms resolved   IBS (irritable bowel syndrome)    Irritable bowel    Leg fracture, left    Psychogenic nonepileptic seizure    last seizure 1 month ago per pt on 01-25-2022 saw dr Yvone Neu neurology   Seizure [facility])    last seizure 12-28-2021 per pt on 01-25-2022   Self-injurious  behavior    Shingles    intermittent outbreaks   Ulcerative colitis (HCC)    Wears glasses     Past Surgical History:  Procedure Laterality Date   APPENDECTOMY     BIOPSY  06/06/2020   Procedure: BIOPSY;  Surgeon: Iva Boop, MD;  Location: [facility];  Service: Endoscopy;;   COLONOSCOPY WITH PROPOFOL N/A 06/06/2020   Procedure: COLONOSCOPY WITH PROPOFOL;  Surgeon: Iva Boop, MD;  Location: [facility];  Service: Endoscopy;  Laterality: N/A;   DIAGNOSTIC LAPAROSCOPY WITH REMOVAL OF ECTOPIC PREGNANCY Right 12/11/2021   Procedure: LAPAROSCOPIC RIGHT SALPINGECTOMY WITH REMOVAL OF ECTOPIC PREGNANCY, lysis of adhesions;  Surgeon: Noland Fordyce, MD;  Location: [facility];  Service: Gynecology;  Laterality: Right;   ESOPHAGOGASTRODUODENOSCOPY (EGD) WITH PROPOFOL N/A 06/06/2020   Procedure: ESOPHAGOGASTRODUODENOSCOPY (EGD) WITH PROPOFOL;  Surgeon: Iva Boop, MD;  Location: [facility];  Service: Endoscopy;  Laterality: N/A;   HAND SURGERY Left    x3   LIGATION OF INTERNAL FISTULA TRACT N/A 03/29/2023   Procedure: LIGATION OF INTERNAL FISTULA TRACT;  Surgeon: Romie Levee, MD;  Location: [facility];  Service: General;  Laterality: N/A;   PLACEMENT OF SETON N/A 01/30/2022   Procedure: PLACEMENT OF SETON;  Surgeon: Romie Levee, MD;  Location: [facility];  Service: General;  Laterality: N/A;   RECTAL EXAM UNDER ANESTHESIA N/A  01/30/2022   Procedure: ANAL EXAM UNDER ANESTHESIA;  Surgeon: Romie Levee, MD;  Location: [facility];  Service: General;  Laterality: N/A;   TUBAL LIGATION     Family History:  Family History  Problem Relation Age of Onset   Colitis Mother    Ulcerative colitis Mother    Heart disease Mother    Other Mother    Colitis Father    Ulcerative colitis Father    Diabetes Father    Stomach cancer Paternal Aunt    Colitis Paternal Grandmother    Ulcerative colitis Paternal Grandmother    Colon cancer Neg Hx    Pancreatic cancer Neg Hx    Esophageal cancer Neg  Hx    Family Psychiatric  History: none reported Social History:  Social History   Substance and Sexual Activity  Alcohol Use Yes   Alcohol/week: 6.0 standard drinks of alcohol   Types: 2 Glasses of wine, 2 Cans of beer, 2 Shots of liquor per week     Social History   Substance and Sexual Activity  Drug Use Yes   Types: Marijuana    Social History   Socioeconomic History   Marital status: Divorced    Spouse name: Not on file   Number of children: Not on file   Years of education: Not on file   Highest education level: Not on file  Occupational History    Employer: [employer]  Tobacco Use   Smoking status: Former    Types: Cigarettes   Smokeless tobacco: Never   Tobacco comments:    Social smoker quit 6-8 yrs ago per pt on 01-25-2022  Vaping Use   Vaping status: Never Used  Substance and Sexual Activity   Alcohol use: Yes    Alcohol/week: 6.0 standard drinks of alcohol    Types: 2 Glasses of wine, 2 Cans of beer, 2 Shots of liquor per week   Drug use: Yes    Types: Marijuana   Sexual activity: Yes  Other Topics Concern   Not on file  Social History Narrative   Engaged   occ EtOH, + Marijuana, no tobacco now - former   Right Handed   Drinks Caffeine    One Story Home    Social Determinants of Health   Financial Resource Strain: Not on file  Food Insecurity: No Food Insecurity (07/13/2023)   Hunger Vital Sign    Worried About Running Out of Food in the Last Year: Never true    Ran Out of Food in the Last Year: Never true  Transportation Needs: No Transportation Needs (07/13/2023)   PRAPARE - Administrator, Civil Service (Medical): No    Lack of Transportation (Non-Medical): No  Physical Activity: Not on file  Stress: Not on file (12/15/2019)  Social Connections: Not on file   Sleep: Good  Appetite:  Good  Current Medications: Current Facility-Administered Medications  Medication Dose Route Frequency Provider Last Rate Last Admin   acetaminophen  (TYLENOL) tablet 650 mg  650 mg Oral Q6H PRN Penn, Cicely, NP   650 mg at 07/17/23 0827   alum & mag hydroxide-simeth (MAALOX/MYLANTA) 200-200-20 MG/5ML suspension 30 mL  30 mL Oral Q4H PRN Penn, Cicely, NP       ARIPiprazole (ABILIFY) tablet 2 mg  2 mg Oral Daily Charm Rings, NP   2 mg at 07/17/23 0825   azithromycin (ZITHROMAX) tablet 250 mg  250 mg Oral Daily Charm Rings, NP   250 mg  at 07/17/23 0825   busPIRone (BUSPAR) tablet 5 mg  5 mg Oral Q1200 Lauree Chandler, NP   5 mg at 07/17/23 1159   cholestyramine (QUESTRAN) packet 2 g  2 g Oral BID WC Lauree Chandler, NP   2 g at 07/17/23 0825   clonazePAM (KLONOPIN) tablet 0.5 mg  0.5 mg Oral TID Charm Rings, NP   0.5 mg at 07/17/23 1159   dicyclomine (BENTYL) capsule 10 mg  10 mg Oral Daily PRN Lewanda Rife, MD       diphenhydrAMINE (BENADRYL) capsule 50 mg  50 mg Oral TID PRN Mcneil Sober, NP   50 mg at 07/15/23 1610   Or   diphenhydrAMINE (BENADRYL) injection 50 mg  50 mg Intramuscular TID PRN Mcneil Sober, NP       haloperidol (HALDOL) tablet 5 mg  5 mg Oral TID PRN Mcneil Sober, NP   5 mg at 07/15/23 9604   Or   haloperidol lactate (HALDOL) injection 5 mg  5 mg Intramuscular TID PRN Mcneil Sober, NP       hydrocortisone cream 1 %   Topical BID PRN Lauree Chandler, NP       hydrOXYzine (ATARAX) tablet 50 mg  50 mg Oral Q6H PRN Lauree Chandler, NP   50 mg at 07/16/23 2008   lamoTRIgine (LAMICTAL) tablet 50 mg  50 mg Oral Daily Lauree Chandler, NP   50 mg at 07/17/23 5409   loperamide (IMODIUM) capsule 2 mg  2 mg Oral PRN Lewanda Rife, MD   2 mg at 07/14/23 1410   ondansetron (ZOFRAN-ODT) disintegrating tablet 4 mg  4 mg Oral Q8H PRN Lewanda Rife, MD   4 mg at 07/16/23 1712   pantoprazole (PROTONIX) EC tablet 40 mg  40 mg Oral Daily Ajibola, Ene A, NP   40 mg at 07/17/23 0824   phenylephrine-shark liver oil-mineral oil-petrolatum (PREPARATION H) rectal ointment 1 Application  1 Application Rectal  BID PRN Lauree Chandler, NP       traZODone (DESYREL) tablet 100 mg  100 mg Oral QHS PRN Lewanda Rife, MD   100 mg at 07/16/23 2058    Lab Results:  Results for orders placed or performed during the hospital encounter of 07/12/23 (from the past 48 hour(s))  Lipid panel     Status: Abnormal   Collection Time: 07/16/23  6:16 AM  Result Value Ref Range   Cholesterol 189 0 - 200 mg/dL   Triglycerides 811 (H) <150 mg/dL   HDL 38 (L) >91 mg/dL   Total CHOL/HDL Ratio 5.0 RATIO   VLDL 48 (H) 0 - 40 mg/dL   LDL Cholesterol 478 (H) 0 - 99 mg/dL    Comment:        Total Cholesterol/HDL:CHD Risk Coronary Heart Disease Risk Table                     Men   Women  1/2 Average Risk   3.4   3.3  Average Risk       5.0   4.4  2 X Average Risk   9.6   7.1  3 X Average Risk  23.4   11.0        Use the calculated Patient Ratio above and the CHD Risk Table to determine the patient's CHD Risk.        ATP III CLASSIFICATION (LDL):  <100     mg/dL   Optimal  295-621  mg/dL   Near or Above                    Optimal  130-159  mg/dL   Borderline  914-782  mg/dL   High  >956     mg/dL   Very High Performed at [facility], [address] [place], [place] [address]   Hemoglobin A1c     Status: None   Collection Time: 07/16/23  6:16 AM  Result Value Ref Range   Hgb A1c MFr Bld 5.4 4.8 - 5.6 %    Comment: (NOTE)         Prediabetes: 5.7 - 6.4         Diabetes: >6.4         Glycemic control for adults with diabetes: <7.0    Mean Plasma Glucose 108 mg/dL    Comment: (NOTE) Performed At: [facility] [address] [place], [place] [id number] Jolene Schimke MD XB:[id number]   TSH     Status: None   Collection Time: 07/16/23  6:16 AM  Result Value Ref Range   TSH 1.916 0.350 - 4.500 uIU/mL    Comment: Performed by a 3rd Generation assay with a functional sensitivity of <=0.01 uIU/mL. Performed at [facility], [address] [place], [place] [address]      Blood Alcohol level:  Lab Results  Component Value Date   [facility] <10 07/12/2023    Metabolic Disorder Labs: Lab Results  Component Value Date   HGBA1C 5.4 07/16/2023   MPG 108 07/16/2023   MPG 102.54 05/23/2022   No results found for: "PROLACTIN" Lab Results  Component Value Date   CHOL 189 07/16/2023   TRIG 239 (H) 07/16/2023   HDL 38 (L) 07/16/2023   CHOLHDL 5.0 07/16/2023   VLDL 48 (H) 07/16/2023   LDLCALC 103 (H) 07/16/2023   LDLCALC 220 (H) 05/23/2022    Physical Findings: AIMS:  , ,  ,  ,    CIWA:    COWS:     Musculoskeletal: Strength & Muscle Tone: within normal limits Gait & Station: normal Patient leans: N/A  Psychiatric Specialty Exam:  Presentation  General Appearance:  Disheveled  Eye Contact: Fair  Speech: Clear and Coherent; Normal Rate  Speech Volume: Normal  Handedness: Right   Mood and Affect  Mood: -- ("great")  Affect: Full Range   Thought Process  Thought Processes: Coherent; Goal Directed; Linear  Descriptions of Associations:Intact  Orientation:Full (Time, Place and Person)  Thought Content:Logical  History of Schizophrenia/Schizoaffective disorder:No  Hallucinations:Hallucinations: None  Ideas of Reference:None  Suicidal Thoughts:Suicidal Thoughts: No  Homicidal Thoughts:Homicidal Thoughts: No   Sensorium  Memory: Immediate Fair  Judgment: Intact  Insight: Present   Executive Functions  Concentration: Fair  Attention Span: Fair  Recall: [employer] of Knowledge: Fair  Language: Fair   Psychomotor Activity  Psychomotor Activity: Psychomotor Activity: Normal   Assets  Assets: Communication Skills; Desire for Improvement; Financial Resources/Insurance; Resilience; Physical Health; Vocational/Educational; Housing   Sleep  Sleep: Sleep: Good    Physical Exam: Physical Exam Constitutional:      General: She is not in acute distress.    Appearance: She is not  ill-appearing, toxic-appearing or diaphoretic.  Eyes:     General: No scleral icterus. Cardiovascular:     Rate and Rhythm: Normal rate.  Pulmonary:     Effort: Pulmonary effort is normal. No respiratory distress.  Neurological:     Mental Status: She is alert  and oriented to person, place, and time.  Psychiatric:        Attention and Perception: Attention and perception normal.        Mood and Affect: Mood and affect normal.        Speech: Speech normal.        Behavior: Behavior normal. Behavior is cooperative.        Thought Content: Thought content normal.        Cognition and Memory: Cognition and memory normal.    Review of Systems  Constitutional:  Negative for chills and fever.  Respiratory:  Negative for shortness of breath.   Cardiovascular:  Negative for chest pain and palpitations.  Gastrointestinal:  Negative for abdominal pain.  Neurological:  Negative for headaches.  Psychiatric/Behavioral: Negative.     Blood pressure (!) 104/57, pulse (!) 57, temperature 98.3 F (36.8 C), resp. rate 17, height 5\' 7"  (1.702 m), weight 127 kg, last menstrual period 07/04/2023, SpO2 97%, unknown if currently breastfeeding. Body mass index is 43.85 kg/m.  Treatment Plan Summary: Daily contact with patient to assess and evaluate symptoms and progress in treatment, Medication management, and Plan    Bipolar disorder -Abilify 2mg  oral daily -Lamictal 50mg  oral daily   Anxiety -Buspar 5mg  oral daily -Klonopin 0.5mg  oral 3 times daily -Hydroxyzine 50mg  oral every 6 hours PRN anxiety   Agitation protocol -Benadryl 50mg  oral or IM 3 times daily PRN agitation -Haldol 5mg  oral or IM 3 times daily Prn agitation   Insomnia -Desyrel 100mg  oral at bedtime PRN sleep  Lauree Chandler, NP 07/17/2023, 1:17 PM

## 2023-07-17 NOTE — Group Note (Signed)
Recreation Therapy Group Note   Group Topic:Healthy Support Systems  Group Date: 07/17/2023 Start Time: 1000 End Time: 1100 Facilitators: Rosina Lowenstein, LRT, CTRS Location:  Craft Room  Group Description: Straw Bridge.  Patients were given 10 plastic drinking straws and an equal length of masking tape. Using the materials provided, patients were instructed to build a free-standing bridge-like structure to suspend an everyday item (ex: deck of cards) off the floor or table surface. All materials were required to be used in Secondary school teacher. LRT facilitated post-activity discussion reviewing the importance of having strong and healthy support systems in our lives. LRT discussed how the people in our lives serve as the tape and the deck of cards we placed on top of our straw structure are the stressors we face in daily life. LRT and pts discussed what happens in our life when things get too heavy for [place], and we don't have strong supports outside of the hospital. Pt shared 2 of their healthy supports in their life aloud in the group.    Goal Area(s) Addressed:  Patient will identify 2 healthy supports in their life. Patient will identify skills to successfully complete activity. Patient will identify correlation of this activity to life post-discharge.  Patient will work on Product manager.   Affect/Mood: N/A   Participation Level: Did not attend    Clinical Observations/Individualized Feedback: Sunny did not attend group.  Plan: Continue to engage patient in RT group sessions 2-3x/week.   Rosina Lowenstein, LRT, CTRS 07/17/2023 11:55 AM

## 2023-07-17 NOTE — Group Note (Signed)
Date:  07/17/2023 Time:  8:44 PM  Group Topic/Focus:  Goals Group:   The focus of this group is to help patients establish daily goals to achieve during treatment and discuss how the patient can incorporate goal setting into their daily lives to aide in recovery.    Participation Level:  Active  Participation Quality:  Appropriate and Attentive  Affect:  Appropriate and Excited  Cognitive:  Alert and Appropriate  Insight: Appropriate, Good, and Improving  Engagement in Group:  Developing/Improving and Engaged  Modes of Intervention:  Discussion, Rapport Building, Socialization, and Support  Additional Comments:     Shaine Mount 07/17/2023, 8:44 PM

## 2023-07-17 NOTE — Group Note (Signed)
Date:  07/17/2023 Time:  5:26 PM  Group Topic/Focus:  Outdoor Recreation    Participation Level:  Active  Participation Quality:  Appropriate  Affect:  Appropriate  Cognitive:  Appropriate  Insight: Appropriate  Engagement in Group:  Engaged  Modes of Intervention:  Activity  Additional Comments:    Wilford Corner 07/17/2023, 5:26 PM

## 2023-07-18 DIAGNOSIS — F314 Bipolar disorder, current episode depressed, severe, without psychotic features: Secondary | ICD-10-CM | POA: Diagnosis not present

## 2023-07-18 MED ORDER — IBUPROFEN 200 MG PO TABS
400.0000 mg | ORAL_TABLET | Freq: Once | ORAL | Status: AC | PRN
  Administered 2023-07-18: 400 mg via ORAL
  Filled 2023-07-18: qty 2

## 2023-07-18 NOTE — Group Note (Signed)
Date:  07/18/2023 Time:  5:27 PM  Group Topic/Focus:  Self Care:   The focus of this group is to help patients understand the importance of self-care in order to improve or restore emotional, physical, spiritual, interpersonal, and financial health.    Participation Level:  Did Not Attend  Participation Quality:  Appropriate  Affect:  Appropriate  Cognitive:  Appropriate  Insight: Appropriate  Engagement in Group:  Engaged  Modes of Intervention:  Activity  Additional Comments:    Brock Larmon 07/18/2023, 5:27 PM

## 2023-07-18 NOTE — Plan of Care (Signed)
Problem: Education: Goal: Knowledge of General Education information will improve Description: Including pain rating scale, medication(s)/side effects and non-pharmacologic comfort measures 07/18/2023 1801 by Delos Haring, RN Outcome: Progressing 07/18/2023 1636 by Delos Haring, RN Outcome: Progressing   Problem: Health Behavior/Discharge Planning: Goal: Ability to manage health-related needs will improve 07/18/2023 1801 by Delos Haring, RN Outcome: Progressing 07/18/2023 1636 by Delos Haring, RN Outcome: Progressing   Problem: Clinical Measurements: Goal: Ability to maintain clinical measurements within normal limits will improve 07/18/2023 1801 by Delos Haring, RN Outcome: Progressing 07/18/2023 1636 by Delos Haring, RN Outcome: Progressing Goal: Will remain free from infection 07/18/2023 1801 by Delos Haring, RN Outcome: Progressing 07/18/2023 1636 by Delos Haring, RN Outcome: Progressing Goal: Diagnostic test results will improve 07/18/2023 1801 by Delos Haring, RN Outcome: Progressing 07/18/2023 1636 by Delos Haring, RN Outcome: Progressing Goal: Respiratory complications will improve 07/18/2023 1801 by Delos Haring, RN Outcome: Progressing 07/18/2023 1636 by Delos Haring, RN Outcome: Progressing Goal: Cardiovascular complication will be avoided 07/18/2023 1801 by Delos Haring, RN Outcome: Progressing 07/18/2023 1636 by Delos Haring, RN Outcome: Progressing   Problem: Activity: Goal: Risk for activity intolerance will decrease 07/18/2023 1801 by Delos Haring, RN Outcome: Progressing 07/18/2023 1636 by Delos Haring, RN Outcome: Progressing   Problem: Nutrition: Goal: Adequate nutrition will be maintained 07/18/2023 1801 by Delos Haring, RN Outcome: Progressing 07/18/2023 1636 by Delos Haring, RN Outcome: Progressing   Problem: Coping: Goal: Level of anxiety will decrease 07/18/2023 1801 by Delos Haring, RN Outcome:  Progressing 07/18/2023 1636 by Delos Haring, RN Outcome: Progressing   Problem: Elimination: Goal: Will not experience complications related to bowel motility 07/18/2023 1801 by Delos Haring, RN Outcome: Progressing 07/18/2023 1636 by Delos Haring, RN Outcome: Progressing Goal: Will not experience complications related to urinary retention 07/18/2023 1801 by Delos Haring, RN Outcome: Progressing 07/18/2023 1636 by Delos Haring, RN Outcome: Progressing   Problem: Pain Managment: Goal: General experience of comfort will improve 07/18/2023 1801 by Delos Haring, RN Outcome: Progressing 07/18/2023 1636 by Delos Haring, RN Outcome: Progressing   Problem: Safety: Goal: Ability to remain free from injury will improve 07/18/2023 1801 by Delos Haring, RN Outcome: Progressing 07/18/2023 1636 by Delos Haring, RN Outcome: Progressing   Problem: Skin Integrity: Goal: Risk for impaired skin integrity will decrease 07/18/2023 1801 by Delos Haring, RN Outcome: Progressing 07/18/2023 1636 by Delos Haring, RN Outcome: Progressing   Problem: Education: Goal: Knowledge of  General Education information/materials will improve 07/18/2023 1801 by Delos Haring, RN Outcome: Progressing 07/18/2023 1636 by Delos Haring, RN Outcome: Progressing Goal: Emotional status will improve 07/18/2023 1801 by Delos Haring, RN Outcome: Progressing 07/18/2023 1636 by Delos Haring, RN Outcome: Progressing Goal: Mental status will improve 07/18/2023 1801 by Delos Haring, RN Outcome: Progressing 07/18/2023 1636 by Delos Haring, RN Outcome: Progressing Goal: Verbalization of understanding the information provided will improve 07/18/2023 1801 by Delos Haring, RN Outcome: Progressing 07/18/2023 1636 by Delos Haring, RN Outcome: Progressing   Problem: Activity: Goal: Interest or engagement in activities will improve 07/18/2023 1801 by Delos Haring,  RN Outcome: Progressing 07/18/2023 1636 by Delos Haring, RN Outcome: Progressing Goal: Sleeping patterns will improve 07/18/2023 1801 by Delos Haring, RN Outcome: Progressing 07/18/2023 1636 by Delos Haring, RN Outcome: Progressing   Problem: Coping: Goal:  Ability to verbalize frustrations and anger appropriately will improve 07/18/2023 1801 by Delos Haring, RN Outcome: Progressing 07/18/2023 1636 by Delos Haring, RN Outcome: Progressing Goal: Ability to demonstrate self-control will improve 07/18/2023 1801 by Delos Haring, RN Outcome: Progressing 07/18/2023 1636 by Delos Haring, RN Outcome: Progressing   Problem: Health Behavior/Discharge Planning: Goal: Identification of resources available to assist in meeting health care needs will improve 07/18/2023 1801 by Delos Haring, RN Outcome: Progressing 07/18/2023 1636 by Delos Haring, RN Outcome: Progressing Goal: Compliance with treatment plan for underlying cause of condition will improve 07/18/2023 1801 by Delos Haring, RN Outcome: Progressing 07/18/2023 1636 by Delos Haring, RN Outcome: Progressing   Problem: Physical Regulation: Goal: Ability to maintain clinical measurements within normal limits will improve 07/18/2023 1801 by Delos Haring, RN Outcome: Progressing 07/18/2023 1636 by Delos Haring, RN Outcome: Progressing   Problem: Safety: Goal: Periods of time without injury will increase 07/18/2023 1801 by Delos Haring, RN Outcome: Progressing 07/18/2023 1636 by Delos Haring, RN Outcome: Progressing

## 2023-07-18 NOTE — Progress Notes (Deleted)
D- Patient alert and oriented x 4. Affect bright/mood congruent. Denies SI/ HI/ AVH. Patient complains. Patient endorses depression and anxiety. Goal of the day. A- Scheduled medications administered to patient, per MD orders. Support and encouragement provided.  Routine safety checks conducted every 15 minutes without incident.  Patient informed to notify staff with problems or concerns and verbalizes understanding. R- No adverse drug reactions noted.  Patient compliant with medications and treatment plan. Patient receptive, calm cooperative and interacts well with others on the unit.  Patient contracts for safety and  remains safe on the unit at this time.

## 2023-07-18 NOTE — Plan of Care (Signed)
  Problem: Education: Goal: Knowledge of General Education information will improve Description: Including pain rating scale, medication(s)/side effects and non-pharmacologic comfort measures Outcome: Progressing   Problem: Coping: Goal: Level of anxiety will decrease Outcome: Progressing   Problem: Pain Managment: Goal: General experience of comfort will improve Outcome: Not Progressing   Problem: Safety: Goal: Ability to remain free from injury will improve Outcome: Progressing

## 2023-07-18 NOTE — Group Note (Signed)
[facility] LCSW Group Therapy Note   Group Date: 07/18/2023 Start Time: 1315 End Time: 1415   Type of Therapy/Topic:  Group Therapy:  Balance in Life  Participation Level:  Active   Description of Group:    This group will address the concept of balance and how it feels and looks when one is unbalanced. Patients will be encouraged to process areas in their lives that are out of balance, and identify reasons for remaining unbalanced. Facilitators will guide patients utilizing problem- solving interventions to address and correct the stressor making their life unbalanced. Understanding and applying boundaries will be explored and addressed for obtaining  and maintaining a balanced life. Patients will be encouraged to explore ways to assertively make their unbalanced needs known to significant others in their lives, using other group members and facilitator for support and feedback.  Therapeutic Goals: Patient will identify two or more emotions or situations they have that consume much of in their lives. Patient will identify signs/triggers that life has become out of balance:  Patient will identify two ways to set boundaries in order to achieve balance in their lives:  Patient will demonstrate ability to communicate their needs through discussion and/or role plays  Summary of Patient Progress: Patient was present for the entirety of the group process. She talked about the importance of support systems and actually utilizing them. Pt was supportive of another pt in group and provided some suggestions about support groups that might be helpful for them. She shared that she has begun to break the cycle with her ex-husband. Pt stated that she got a U-haul so she can move his things after she leaves the hospital. Insight into herself and the topic was present. She appeared open and receptive to feedback/comments from both peers and facilitator.    Therapeutic Modalities:   Cognitive Behavioral  Therapy Solution-Focused Therapy Assertiveness Training   Glenis Smoker, LCSW

## 2023-07-18 NOTE — Plan of Care (Signed)
  Problem: Education: Goal: Knowledge of General Education information will improve Description: Including pain rating scale, medication(s)/side effects and non-pharmacologic comfort measures Outcome: Progressing   Problem: Health Behavior/Discharge Planning: Goal: Ability to manage health-related needs will improve Outcome: Progressing   Problem: Clinical Measurements: Goal: Ability to maintain clinical measurements within normal limits will improve Outcome: Progressing Goal: Will remain free from infection Outcome: Progressing Goal: Diagnostic test results will improve Outcome: Progressing Goal: Respiratory complications will improve Outcome: Progressing Goal: Cardiovascular complication will be avoided Outcome: Progressing   Problem: Activity: Goal: Risk for activity intolerance will decrease Outcome: Progressing   Problem: Nutrition: Goal: Adequate nutrition will be maintained Outcome: Progressing   Problem: Coping: Goal: Level of anxiety will decrease Outcome: Progressing   Problem: Elimination: Goal: Will not experience complications related to bowel motility Outcome: Progressing Goal: Will not experience complications related to urinary retention Outcome: Progressing   Problem: Pain Managment: Goal: General experience of comfort will improve Outcome: Progressing   Problem: Safety: Goal: Ability to remain free from injury will improve Outcome: Progressing   Problem: Skin Integrity: Goal: Risk for impaired skin integrity will decrease Outcome: Progressing   Problem: Education: Goal: Knowledge of [facility] Education information/materials will improve Outcome: Progressing Goal: Emotional status will improve Outcome: Progressing Goal: Mental status will improve Outcome: Progressing Goal: Verbalization of understanding the information provided will improve Outcome: Progressing   Problem: Activity: Goal: Interest or engagement in activities will  improve Outcome: Progressing Goal: Sleeping patterns will improve Outcome: Progressing   Problem: Coping: Goal: Ability to verbalize frustrations and anger appropriately will improve Outcome: Progressing Goal: Ability to demonstrate self-control will improve Outcome: Progressing   Problem: Health Behavior/Discharge Planning: Goal: Identification of resources available to assist in meeting health care needs will improve Outcome: Progressing Goal: Compliance with treatment plan for underlying cause of condition will improve Outcome: Progressing   Problem: Physical Regulation: Goal: Ability to maintain clinical measurements within normal limits will improve Outcome: Progressing   Problem: Safety: Goal: Periods of time without injury will increase Outcome: Progressing   

## 2023-07-18 NOTE — Group Note (Signed)
Date:  07/18/2023 Time:  10:39 AM  Group Topic/Focus:  Activity Group:  Focus of the group was to encourage activities such as coloring, playing cards or just having conversations for the benefit of mental health treatment.    Participation Level:  Active  Participation Quality:  Appropriate  Affect:  Appropriate  Cognitive:  Appropriate  Insight: Appropriate  Engagement in Group:  Engaged  Modes of Intervention:  Activity  Additional Comments:    Barbara Orozco 07/18/2023, 10:39 AM

## 2023-07-18 NOTE — Progress Notes (Signed)
Patient pleasant and cooperative with care. Voiced no complaints or concerns. Denies SI, HI, AVH. Looking forward to discharge on tomorrow. Prn given for nausea and sleep. Encouragement and support provided. Safety checks maintained. Medications given as prescribed. Pt receptive and remains safe on unit with q 15 min checks.

## 2023-07-18 NOTE — Progress Notes (Signed)
Patient was pleasant on approach, she interacted well with staff and peers on the shift. She denies SI, HI & AVH. She was compliant with medications. No new behavioral issues to report on shift at this time.

## 2023-07-18 NOTE — Progress Notes (Signed)
D- Patient alert and oriented x 4. Affect bright /mood euthymic. Denies SI/ HI/ AVH. Patient complains of "fibroids"  pain. Tylenol alternating with Ibuprofen effective.Patient endorses mild depression and anxiety. Her goals of the day are mood regulation, ask for help and utilize medications. A- Scheduled medications administered to patient, per MD orders. Support and encouragement provided.  Routine safety checks conducted every 15 minutes without incident.  Patient informed to notify staff with problems or concerns and verbalizes understanding. R- No adverse drug reactions noted.  Patient compliant with medications and treatment plan. Patient receptive, calm cooperative and interacts well with others on the unit.  Patient contracts for safety and  remains safe on the unit at this time.

## 2023-07-18 NOTE — Group Note (Signed)
Recreation Therapy Group Note   Group Topic:Relaxation  Group Date: 07/18/2023 Start Time: 1000 End Time: 1050 Facilitators: Rosina Lowenstein, LRT, CTRS Location:  [facility]  Group Description: PMR (Progressive Muscle Relaxation). LRT asks patients their current level of stress/anxiety from 1-10, with 10 being the highest. LRT educates patients on what PMR is and the benefits that come from it. Patients are asked to sit with their feet flat on the floor while sitting up and all the way back in their chair, if possible. LRT and pts follow a prompt through a speaker that requires you to tense and release different muscles in their body and focus on their breathing. During session, lights are off and soft music is being played. Pts are given a stress ball to use if needed. At the end of the prompt, LRT asks patients to rank their current levels of stress/anxiety from 1-10, 10 being the highest.   Goal Area(s) Addressed:  Patients will be able to describe progressive muscle relaxation.  Patient will practice using relaxation technique. Patient will identify a new coping skill.  Patient will follow multistep directions to reduce anxiety and stress.   Affect/Mood: Appropriate   Participation Level: Active and Engaged   Participation Quality: Independent   Behavior: Appropriate, Calm, and Cooperative   Speech/Thought Process: Coherent   Insight: Good   Judgement: Good   Modes of Intervention: Activity   Patient Response to Interventions:  Attentive, Engaged, Interested , and Receptive   Education Outcome:  Acknowledges education   Clinical Observations/Individualized Feedback: Learta Codding was active in their participation of session activities and group discussion. Pt identified that her anxiety was a 6 and stress was a 7 before the session. After, she rated her anxiety a 5 and her stress a 6. Pt interacted well with LRT and peers duration of session.   Plan: Continue to engage patient  in RT group sessions 2-3x/week.   Rosina Lowenstein, LRT, CTRS 07/18/2023 11:12 AM

## 2023-07-18 NOTE — Progress Notes (Signed)
[facility] MD Progress Note  07/18/2023 11:48 AM Barbara Orozco  MRN:  [id number]  Subjective:  Pt chart reviewed, discussed with interdisciplinary team, and seen on rounds. Reports "doing well" today. . She denies suicidal, homicidal ideations. She denies auditory visual hallucinations or paranoia. States she found out her grandmother, who she is primary caregiver for, went to the hospital this morning. She states her supports are her grandmother and her boyfriend.  Agreed for collateral to be called. She gave verbal consent to call her boyfriend, and provided name and phone number, Gordy Savers, [phone]. Attempted call without success. Pt states she receives medication management from [facility] in [place]. She states she has no therapist in place but is interested. She is hoping for discharge soon. States she is feeling more stable. She has also consulted with her work to return for a half day at [facility]. Will consult social work and plan discharge for tomorrow.   Update: Later in the day pt reported her boyfriend was available by phone. Called Damien back and he agrees with plan for discharge tomorrow. He will be the one to pick pt up. He denies safety concerns with discharge and states he can continue to support and monitor pt at discharge. He denies knowledge of pt having access to firearms or other weapons.  Problem: Severe bipolar I disorder, current or most recent episode depressed (HCC) Diagnosis: Principal Problem:   Severe bipolar I disorder, current or most recent episode depressed (HCC) Active Problems:   Acute posttraumatic stress disorder  Total Time spent with patient:  25 minutes  Past Psychiatric History: bipolar d/o, cocaine abuse, bpd  Past Medical History:  Past Medical History:  Diagnosis Date   Acute posttraumatic stress disorder    Allergy    Anal fistula    Arm fracture    right and left   Asthma    Bipolar 2 disorder (HCC)    Borderline personality disorder  (HCC) 05/23/2022   Cannabis use disorder    Cocaine use disorder, moderate, in early remission (HCC)    Complication of anesthesia    "wakes up angry for about 5 minutes"   Depression    Endometriosis    Endometriosis    Foot pain    right foot hurts since last night no reported injury per pt on 01-25-2022   GERD (gastroesophageal reflux disease)    Grief associated with loss of fetus    History of 2019 novel coronavirus disease (COVID-19) 04/29/2020   + Ag test - can see in Care everywhere   History of chicken pox    had 4 or 5 times as child   History of COVID-19 04/29/2020   mild all symptoms resolved   IBS (irritable bowel syndrome)    Irritable bowel    Leg fracture, left    Psychogenic nonepileptic seizure    last seizure 1 month ago per pt on 01-25-2022 saw dr Yvone Neu neurology   Seizure [facility])    last seizure 12-28-2021 per pt on 01-25-2022   Self-injurious behavior    Shingles    intermittent outbreaks   Ulcerative colitis (HCC)    Wears glasses     Past Surgical History:  Procedure Laterality Date   APPENDECTOMY     BIOPSY  06/06/2020   Procedure: BIOPSY;  Surgeon: Iva Boop, MD;  Location: [facility];  Service: Endoscopy;;   COLONOSCOPY WITH PROPOFOL N/A 06/06/2020   Procedure: COLONOSCOPY WITH PROPOFOL;  Surgeon: Iva Boop, MD;  Location:  [facility];  Service: Endoscopy;  Laterality: N/A;   DIAGNOSTIC LAPAROSCOPY WITH REMOVAL OF ECTOPIC PREGNANCY Right 12/11/2021   Procedure: LAPAROSCOPIC RIGHT SALPINGECTOMY WITH REMOVAL OF ECTOPIC PREGNANCY, lysis of adhesions;  Surgeon: Noland Fordyce, MD;  Location: [facility] OR;  Service: Gynecology;  Laterality: Right;   ESOPHAGOGASTRODUODENOSCOPY (EGD) WITH PROPOFOL N/A 06/06/2020   Procedure: ESOPHAGOGASTRODUODENOSCOPY (EGD) WITH PROPOFOL;  Surgeon: Iva Boop, MD;  Location: [facility];  Service: Endoscopy;  Laterality: N/A;   HAND SURGERY Left    x3   LIGATION OF INTERNAL FISTULA TRACT N/A 03/29/2023    Procedure: LIGATION OF INTERNAL FISTULA TRACT;  Surgeon: Romie Levee, MD;  Location: [facility];  Service: General;  Laterality: N/A;   PLACEMENT OF SETON N/A 01/30/2022   Procedure: PLACEMENT OF SETON;  Surgeon: Romie Levee, MD;  Location: [facility];  Service: General;  Laterality: N/A;   RECTAL EXAM UNDER ANESTHESIA N/A 01/30/2022   Procedure: ANAL EXAM UNDER ANESTHESIA;  Surgeon: Romie Levee, MD;  Location: [facility];  Service: General;  Laterality: N/A;   TUBAL LIGATION     Family History:  Family History  Problem Relation Age of Onset   Colitis Mother    Ulcerative colitis Mother    Heart disease Mother    Other Mother    Colitis Father    Ulcerative colitis Father    Diabetes Father    Stomach cancer Paternal Aunt    Colitis Paternal Grandmother    Ulcerative colitis Paternal Grandmother    Colon cancer Neg Hx    Pancreatic cancer Neg Hx    Esophageal cancer Neg Hx    Family Psychiatric  History: none reported Social History:  Social History   Substance and Sexual Activity  Alcohol Use Yes   Alcohol/week: 6.0 standard drinks of alcohol   Types: 2 Glasses of wine, 2 Cans of beer, 2 Shots of liquor per week     Social History   Substance and Sexual Activity  Drug Use Yes   Types: Marijuana    Social History   Socioeconomic History   Marital status: Divorced    Spouse name: Not on file   Number of children: Not on file   Years of education: Not on file   Highest education level: Not on file  Occupational History    Employer: [employer]  Tobacco Use   Smoking status: Former    Types: Cigarettes   Smokeless tobacco: Never   Tobacco comments:    Social smoker quit 6-8 yrs ago per pt on 01-25-2022  Vaping Use   Vaping status: Never Used  Substance and Sexual Activity   Alcohol use: Yes    Alcohol/week: 6.0 standard drinks of alcohol    Types: 2 Glasses of wine, 2 Cans of beer, 2 Shots of liquor per week   Drug use: Yes    Types: Marijuana   Sexual activity: Yes   Other Topics Concern   Not on file  Social History Narrative   Engaged   occ EtOH, + Marijuana, no tobacco now - former   Right Handed   Drinks Caffeine    One Story Home    Social Determinants of Health   Financial Resource Strain: Not on file  Food Insecurity: No Food Insecurity (07/13/2023)   Hunger Vital Sign    Worried About Running Out of Food in the Last Year: Never true    Ran Out of Food in the Last Year: Never true  Transportation Needs: No Transportation Needs (  07/13/2023)   PRAPARE - Administrator, Civil Service (Medical): No    Lack of Transportation (Non-Medical): No  Physical Activity: Not on file  Stress: Not on file (12/15/2019)  Social Connections: Not on file   Sleep: Fair  Appetite:  Good  Current Medications: Current Facility-Administered Medications  Medication Dose Route Frequency Provider Last Rate Last Admin   acetaminophen (TYLENOL) tablet 650 mg  650 mg Oral Q6H PRN Penn, Cicely, NP   650 mg at 07/18/23 0804   alum & mag hydroxide-simeth (MAALOX/MYLANTA) 200-200-20 MG/5ML suspension 30 mL  30 mL Oral Q4H PRN Penn, Cranston Neighbor, NP       ARIPiprazole (ABILIFY) tablet 2 mg  2 mg Oral Daily Charm Rings, NP   2 mg at 07/18/23 0804   busPIRone (BUSPAR) tablet 5 mg  5 mg Oral Q1200 Lauree Chandler, NP   5 mg at 07/18/23 1107   cholestyramine (QUESTRAN) packet 2 g  2 g Oral BID WC Lauree Chandler, NP   2 g at 07/17/23 2103   clonazePAM (KLONOPIN) tablet 0.5 mg  0.5 mg Oral TID Charm Rings, NP   0.5 mg at 07/18/23 0805   dicyclomine (BENTYL) capsule 10 mg  10 mg Oral Daily PRN Lewanda Rife, MD       diphenhydrAMINE (BENADRYL) capsule 50 mg  50 mg Oral TID PRN Mcneil Sober, NP   50 mg at 07/15/23 1478   Or   diphenhydrAMINE (BENADRYL) injection 50 mg  50 mg Intramuscular TID PRN Mcneil Sober, NP       haloperidol (HALDOL) tablet 5 mg  5 mg Oral TID PRN Mcneil Sober, NP   5 mg at 07/15/23 2956   Or   haloperidol lactate (HALDOL)  injection 5 mg  5 mg Intramuscular TID PRN Mcneil Sober, NP       hydrocortisone cream 1 %   Topical BID PRN Lauree Chandler, NP       hydrOXYzine (ATARAX) tablet 50 mg  50 mg Oral Q6H PRN Lauree Chandler, NP   50 mg at 07/18/23 1106   ibuprofen (ADVIL) tablet 400 mg  400 mg Oral Once PRN Lauree Chandler, NP       lamoTRIgine (LAMICTAL) tablet 50 mg  50 mg Oral Daily Lauree Chandler, NP   50 mg at 07/18/23 0804   loperamide (IMODIUM) capsule 2 mg  2 mg Oral PRN Lewanda Rife, MD   2 mg at 07/14/23 1410   ondansetron (ZOFRAN-ODT) disintegrating tablet 4 mg  4 mg Oral Q8H PRN Lewanda Rife, MD   4 mg at 07/17/23 1522   pantoprazole (PROTONIX) EC tablet 40 mg  40 mg Oral Daily Ajibola, Ene A, NP   40 mg at 07/18/23 0804   phenylephrine-shark liver oil-mineral oil-petrolatum (PREPARATION H) rectal ointment 1 Application  1 Application Rectal BID PRN Lauree Chandler, NP       traZODone (DESYREL) tablet 100 mg  100 mg Oral QHS PRN Lewanda Rife, MD   100 mg at 07/17/23 2105    Lab Results: No results found for this or any previous visit (from the past 48 hour(s)).  Blood Alcohol level:  Lab Results  Component Value Date   ETH <10 07/12/2023    Metabolic Disorder Labs: Lab Results  Component Value Date   HGBA1C 5.4 07/16/2023   MPG 108 07/16/2023   MPG 102.54 05/23/2022   No results found for: "PROLACTIN" Lab Results  Component Value Date  CHOL 189 07/16/2023   TRIG 239 (H) 07/16/2023   HDL 38 (L) 07/16/2023   CHOLHDL 5.0 07/16/2023   VLDL 48 (H) 07/16/2023   LDLCALC 103 (H) 07/16/2023   LDLCALC 220 (H) 05/23/2022    Physical Findings: AIMS:  , ,  ,  ,    CIWA:    COWS:     Musculoskeletal: Strength & Muscle Tone: within normal limits Gait & Station: normal Patient leans: N/A  Psychiatric Specialty Exam:  Presentation  General Appearance:  Fairly Groomed; Casual  Eye Contact: Fair  Speech: Clear and Coherent; Normal Rate  Speech  Volume: Normal  Handedness: Right   Mood and Affect  Mood: -- ("doing well")  Affect: Full Range   Thought Process  Thought Processes: Coherent; Goal Directed; Linear  Descriptions of Associations:Intact  Orientation:Full (Time, Place and Person)  Thought Content:Logical  History of Schizophrenia/Schizoaffective disorder:No  Duration of Psychotic Symptoms:No data recorded Hallucinations:Hallucinations: None  Ideas of Reference:None  Suicidal Thoughts:Suicidal Thoughts: No  Homicidal Thoughts:Homicidal Thoughts: No   Sensorium  Memory: Immediate Fair  Judgment: Intact  Insight: Present   Executive Functions  Concentration: Fair  Attention Span: Fair  Recall: [employer] of Knowledge: Fair  Language: Fair   Psychomotor Activity  Psychomotor Activity: Psychomotor Activity: Normal   Assets  Assets: Communication Skills; Desire for Improvement; Financial Resources/Insurance; Housing; Intimacy; Leisure Time; Physical Health; Resilience; Social Support; Vocational/Educational   Sleep  Sleep: Sleep: Fair    Physical Exam: Physical Exam Constitutional:      General: She is not in acute distress.    Appearance: She is not ill-appearing, toxic-appearing or diaphoretic.  Eyes:     General: No scleral icterus. Cardiovascular:     Rate and Rhythm: Normal rate.  Pulmonary:     Effort: Pulmonary effort is normal. No respiratory distress.  Neurological:     Mental Status: She is alert and oriented to person, place, and time.  Psychiatric:        Attention and Perception: Attention and perception normal.        Mood and Affect: Mood and affect normal.        Speech: Speech normal.        Behavior: Behavior normal. Behavior is cooperative.        Thought Content: Thought content normal.        Cognition and Memory: Cognition and memory normal.        Judgment: Judgment normal.    Review of Systems  Constitutional:  Negative for  chills and fever.  Respiratory:  Negative for shortness of breath.   Cardiovascular:  Negative for chest pain and palpitations.  Gastrointestinal:  Negative for abdominal pain.  Neurological:  Negative for headaches.  Psychiatric/Behavioral: Negative.     Blood pressure 101/68, pulse 63, temperature (!) 97.4 F (36.3 C), resp. rate 17, height 5\' 7"  (1.702 m), weight 127 kg, last menstrual period 07/04/2023, SpO2 99%, unknown if currently breastfeeding. Body mass index is 43.85 kg/m.   Treatment Plan Summary: Daily contact with patient to assess and evaluate symptoms and progress in treatment, Medication management, and Plan    Bipolar disorder -Abilify 2mg  oral daily -Lamictal 50mg  oral daily   Anxiety -Buspar 5mg  oral daily -Klonopin 0.5mg  oral 3 times daily -Hydroxyzine 50mg  oral every 6 hours PRN anxiety   Agitation protocol -Benadryl 50mg  oral or IM 3 times daily PRN agitation -Haldol 5mg  oral or IM 3 times daily Prn agitation   Insomnia -Desyrel 100mg  oral at  bedtime PRN sleep  Lauree Chandler, NP 07/18/2023, 11:48 AM

## 2023-07-18 NOTE — Group Note (Signed)
Date:  07/18/2023 Time:  10:35 PM  Group Topic/Focus:  Overcoming Stress:   The focus of this group is to define stress and help patients assess their triggers.    Participation Level:  Active  Participation Quality:  Appropriate and Attentive  Affect:  Appropriate  Cognitive:  Alert and Appropriate  Insight: Appropriate and Good  Engagement in Group:  Developing/Improving  Modes of Intervention:  Discussion, Education, and Limit-setting  Additional Comments:     Garik Diamant 07/18/2023, 10:35 PM

## 2023-07-19 DIAGNOSIS — F314 Bipolar disorder, current episode depressed, severe, without psychotic features: Secondary | ICD-10-CM | POA: Diagnosis not present

## 2023-07-19 MED ORDER — CLONAZEPAM 0.5 MG PO TABS: 0.5000 mg | ORAL_TABLET | Freq: Three times a day (TID) | ORAL | 0 refills | Status: DC

## 2023-07-19 MED ORDER — HYDROXYZINE HCL 50 MG PO TABS: 50.0000 mg | ORAL_TABLET | Freq: Four times a day (QID) | ORAL | 0 refills | Status: DC | PRN

## 2023-07-19 MED ORDER — BUSPIRONE HCL 5 MG PO TABS: 5.0000 mg | ORAL_TABLET | Freq: Every day | ORAL | 3 refills | Status: DC

## 2023-07-19 MED ORDER — LAMOTRIGINE 25 MG PO TABS: 50.0000 mg | ORAL_TABLET | Freq: Every day | ORAL | 3 refills | Status: DC

## 2023-07-19 MED ORDER — TRAZODONE HCL 100 MG PO TABS: 100.0000 mg | ORAL_TABLET | Freq: Every evening | ORAL | 3 refills | Status: DC | PRN

## 2023-07-19 MED ORDER — ARIPIPRAZOLE 2 MG PO TABS: 2.0000 mg | ORAL_TABLET | Freq: Every day | ORAL | 3 refills | Status: DC

## 2023-07-19 NOTE — Group Note (Signed)
Recreation Therapy Group Note   Group Topic:Leisure Education  Group Date: 07/19/2023 Start Time: 1000 End Time: 1100 Facilitators: Rosina Lowenstein, LRT, CTRS Location:  Craft Room  Group Description: Leisure. Patients were given the option to choose from singing karaoke, coloring mandalas, using oil pastels, journaling, or playing with play-doh. LRT and pts discussed the meaning of leisure, the importance of participating in leisure during their free time/when they're outside of the hospital, as well as how our leisure interests can also serve as coping skills.    Goal Area(s) Addressed:  Patient will identify a current leisure interest.  Patient will learn the definition of "leisure". Patient will practice making a positive decision. Patient will have the opportunity to try a new leisure activity. Patient will communicate with peers and LRT.    Affect/Mood: Appropriate   Participation Level: Active and Engaged   Participation Quality: Independent   Behavior: Appropriate, Calm, and Cooperative   Speech/Thought Process: Coherent   Insight: Good   Judgement: Good   Modes of Intervention: Activity   Patient Response to Interventions:  Attentive, Engaged, Interested , and Receptive   Education Outcome:  Acknowledges education   Clinical Observations/Individualized Feedback: Barbara Orozco was active in their participation of session activities and group discussion. Pt identified "listen to music and take my dog for a walk" as things she does in her free time. Pt chose to draw with oil pastels while in group. Pt interacted well with LRT and peers duration of session.   Plan: Continue to engage patient in RT group sessions 2-3x/week.   Rosina Lowenstein, LRT, CTRS 07/19/2023 11:27 AM

## 2023-07-19 NOTE — BHH Suicide Risk Assessment (Signed)
[facility] Discharge Suicide Risk Assessment   Principal Problem: Severe bipolar I disorder, current or most recent episode depressed (HCC) Discharge Diagnoses: Principal Problem:   Severe bipolar I disorder, current or most recent episode depressed (HCC) Active Problems:   Acute posttraumatic stress disorder   Total Time spent with patient: 1 hour  Musculoskeletal: Strength & Muscle Tone: within normal limits Gait & Station: normal Patient leans: N/A  Psychiatric Specialty Exam  Presentation  General Appearance:  Fairly Groomed; Casual  Eye Contact: Fair  Speech: Clear and Coherent; Normal Rate  Speech Volume: Normal  Handedness: Right   Mood and Affect  Mood: -- ("doing well")  Duration of Depression Symptoms: Greater than two weeks  Affect: Full Range   Thought Process  Thought Processes: Coherent; Goal Directed; Linear  Descriptions of Associations:Intact  Orientation:Full (Time, Place and Person)  Thought Content:Logical  History of Schizophrenia/Schizoaffective disorder:No  Duration of Psychotic Symptoms:No data recorded Hallucinations:Hallucinations: None  Ideas of Reference:None  Suicidal Thoughts:Suicidal Thoughts: No  Homicidal Thoughts:Homicidal Thoughts: No   Sensorium  Memory: Immediate Fair  Judgment: Intact  Insight: Present   Executive Functions  Concentration: Fair  Attention Span: Fair  Recall: [employer] of Knowledge: Fair  Language: Fair   Psychomotor Activity  Psychomotor Activity: Psychomotor Activity: Normal   Assets  Assets: Communication Skills; Desire for Improvement; Financial Resources/Insurance; Housing; Intimacy; Leisure Time; Physical Health; Resilience; Social Support; Vocational/Educational   Sleep  Sleep: Sleep: Fair    Blood pressure 115/74, pulse 61, temperature 98.1 F (36.7 C), temperature source Oral, resp. rate 17, height 5\' 7"  (1.702 m), weight 127 kg, last menstrual  period 07/04/2023, SpO2 99%, unknown if currently breastfeeding. Body mass index is 43.85 kg/m.  Mental Status Per Nursing Assessment::   On Admission:  Self-harm thoughts  Demographic Factors:  Caucasian  Loss Factors: NA  Historical Factors: NA  Risk Reduction Factors:   Positive social support and Positive therapeutic relationship  Continued Clinical Symptoms:  Bipolar Disorder:   Mixed State  Cognitive Features That Contribute To Risk:  None    Suicide Risk:  Minimal: No identifiable suicidal ideation.  Patients presenting with no risk factors but with morbid ruminations; may be classified as minimal risk based on the severity of the depressive symptoms   Follow-up Information     Loni Beckwith, [employer] Follow up.   Why: Follow through with your 07/23/23 appointment at [facility]. Thanks! Contact information: [address] [place] [address] [phone]                 Plan Of Care/Follow-up recommendations: Dr. Susy Manor, DO 07/19/2023, 10:18 AM

## 2023-07-19 NOTE — Progress Notes (Signed)
Discharge Note:  Patient denies SI/HI/AVH at this time. Discharge instructions, AVS, and transition record gone over with patient. Patient agrees to comply with medication management, follow-up visit, and outpatient therapy. Patient belongings returned to patient. Patient questions and concerns addressed and answered. Patient ambulatory off unit. Patient discharged to home with boyfriend.

## 2023-07-19 NOTE — Group Note (Unsigned)
Date:  07/19/2023 Time:  1:58 AM  Group Topic/Focus:  Spirituality:   The focus of this group is to discuss how one's spirituality can aide in recovery.     Participation Level:  {BHH PARTICIPATION QIHKV:42595}  Participation Quality:  {BHH PARTICIPATION QUALITY:22265}  Affect:  {BHH AFFECT:22266}  Cognitive:  {BHH COGNITIVE:22267}  Insight: {BHH Insight2:20797}  Engagement in Group:  {BHH ENGAGEMENT IN GLOVF:64332}  Modes of Intervention:  {BHH MODES OF INTERVENTION:22269}  Additional Comments:  ***  Lenore Cordia 07/19/2023, 1:58 AM
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
## 2023-07-19 NOTE — Plan of Care (Signed)
Problem: Education: Goal: Knowledge of General Education information will improve Description: Including pain rating scale, medication(s)/side effects and non-pharmacologic comfort measures Outcome: Adequate for Discharge   Problem: Health Behavior/Discharge Planning: Goal: Ability to manage health-related needs will improve Outcome: Adequate for Discharge   Problem: Clinical Measurements: Goal: Ability to maintain clinical measurements within normal limits will improve Outcome: Adequate for Discharge Goal: Will remain free from infection Outcome: Adequate for Discharge Goal: Diagnostic test results will improve Outcome: Adequate for Discharge Goal: Respiratory complications will improve Outcome: Adequate for Discharge Goal: Cardiovascular complication will be avoided Outcome: Adequate for Discharge   Problem: Activity: Goal: Risk for activity intolerance will decrease Outcome: Adequate for Discharge   Problem: Nutrition: Goal: Adequate nutrition will be maintained Outcome: Adequate for Discharge   Problem: Coping: Goal: Level of anxiety will decrease Outcome: Adequate for Discharge   Problem: Elimination: Goal: Will not experience complications related to bowel motility Outcome: Adequate for Discharge Goal: Will not experience complications related to urinary retention Outcome: Adequate for Discharge   Problem: Pain Managment: Goal: General experience of comfort will improve Outcome: Adequate for Discharge   Problem: Safety: Goal: Ability to remain free from injury will improve Outcome: Adequate for Discharge   Problem: Skin Integrity: Goal: Risk for impaired skin integrity will decrease Outcome: Adequate for Discharge   Problem: Education: Goal: Knowledge of [facility] Education information/materials will improve Outcome: Adequate for Discharge Goal: Emotional status will improve Outcome: Adequate for Discharge Goal: Mental status will  improve Outcome: Adequate for Discharge Goal: Verbalization of understanding the information provided will improve Outcome: Adequate for Discharge   Problem: Activity: Goal: Interest or engagement in activities will improve Outcome: Adequate for Discharge Goal: Sleeping patterns will improve Outcome: Adequate for Discharge   Problem: Coping: Goal: Ability to verbalize frustrations and anger appropriately will improve Outcome: Adequate for Discharge Goal: Ability to demonstrate self-control will improve Outcome: Adequate for Discharge   Problem: Health Behavior/Discharge Planning: Goal: Identification of resources available to assist in meeting health care needs will improve Outcome: Adequate for Discharge Goal: Compliance with treatment plan for underlying cause of condition will improve Outcome: Adequate for Discharge   Problem: Physical Regulation: Goal: Ability to maintain clinical measurements within normal limits will improve Outcome: Adequate for Discharge   Problem: Safety: Goal: Periods of time without injury will increase Outcome: Adequate for Discharge

## 2023-07-19 NOTE — [facility] Counselor (Signed)
CSW met with pt briefly to discuss discharge/aftercare plans. She shared plans to return home upon discharge and that transportation would be provided by her boyfriend. Pt reports that she has an appointment set up with [facility] on 07/23/23 at [facility]. She states that she is not certain whether it was 2 or 2:30PM. CSW shares that he can call to verify and pt states that this is ok because she can do this on her own. She has clothes to wear home upon discharge. Pt denied any use of tobacco products or need for cessation services. However, pt was noted to be medium risk on SDOH so CSW will attach [facility] brochure to discharge paperwork. She reports that she smokes marijuana but denies any interest in substance use specific services. CSW shares that it was noted that she was interested in therapy. Pt states that she is interested in trauma therapists specifically. Pt and CSW discussed this briefly. She shared that she can google on her own when CSW offered to put together some resources for this. CSW verified this. When asked if there were any other questions, pt inquired about discharge time. She was informed that CSW would need to check with provider. She agreed. No other concerns expressed. Contact ended without incident.   Barbara Orozco. Algis Greenhouse, MSW, LCSW, LCAS 07/19/2023 9:44 AM

## 2023-07-23 DIAGNOSIS — F3181 Bipolar II disorder: Secondary | ICD-10-CM | POA: Diagnosis not present

## 2023-07-23 DIAGNOSIS — F902 Attention-deficit hyperactivity disorder, combined type: Secondary | ICD-10-CM | POA: Diagnosis not present

## 2023-08-16 ENCOUNTER — Encounter (HOSPITAL_BASED_OUTPATIENT_CLINIC_OR_DEPARTMENT_OTHER): Payer: Self-pay | Admitting: Family Medicine

## 2023-08-16 ENCOUNTER — Ambulatory Visit (HOSPITAL_BASED_OUTPATIENT_CLINIC_OR_DEPARTMENT_OTHER): Payer: Self-pay | Admitting: Family Medicine

## 2023-08-16 VITALS — BP 109/70 | HR 73 | Ht 67.0 in | Wt 280.7 lb

## 2023-08-16 DIAGNOSIS — F603 Borderline personality disorder: Secondary | ICD-10-CM

## 2023-08-16 DIAGNOSIS — Z7689 Persons encountering health services in other specified circumstances: Secondary | ICD-10-CM

## 2023-08-16 DIAGNOSIS — R1012 Left upper quadrant pain: Secondary | ICD-10-CM | POA: Diagnosis not present

## 2023-08-16 DIAGNOSIS — R1011 Right upper quadrant pain: Secondary | ICD-10-CM

## 2023-08-16 DIAGNOSIS — N809 Endometriosis, unspecified: Secondary | ICD-10-CM

## 2023-08-16 DIAGNOSIS — F902 Attention-deficit hyperactivity disorder, combined type: Secondary | ICD-10-CM

## 2023-08-16 NOTE — Progress Notes (Signed)
New Patient Office Visit  Subjective    Patient ID: Barbara Orozco, female    DOB: 04/16/1986  Age: 37 y.o. MRN: [id number]  HPI Barbara Orozco is a 37 year old female who presents to establish with Primary Care & Sports Medicine at [facility]. She made an appt today to have her EKG performed prior to start stimulant medication. Denies chest pain, palpitations, changes in vision, shortness of breath, lower extremity edema, headaches, lightheadedness, weakness.  She is seeing [facility] for medication management of her mental health disorders.   Lower back pain about a week, worsens also occurs with and after intercourse  Tylenol & ibuprofen does not improve pain  No urinary symptoms, fever/chills. Denies vaginal dryness, lesions, pain.  Endometriosis- always have abd pain   Outpatient Encounter Medications as of 08/16/2023  Medication Sig   ARIPiprazole (ABILIFY) 2 MG tablet Take 1 tablet (2 mg total) by mouth daily.   busPIRone (BUSPAR) 5 MG tablet Take 1 tablet (5 mg total) by mouth daily at 12 noon.   cholestyramine (QUESTRAN) 4 GM/DOSE powder Take 2 g by mouth 3 (three) times daily with meals.   clonazePAM (KLONOPIN) 0.5 MG tablet Take 1 tablet (0.5 mg total) by mouth 3 (three) times daily.   dicyclomine (BENTYL) 10 MG capsule Take 10 mg by mouth daily as needed for spasms.   lamoTRIgine (LAMICTAL) 25 MG tablet Take 2 tablets (50 mg total) by mouth daily.   MELATONIN PO Take 1 tablet by mouth at bedtime.   ondansetron (ZOFRAN-ODT) 4 MG disintegrating tablet Take 1 tablet (4 mg total) by mouth every 8 (eight) hours as needed for nausea or vomiting.   pantoprazole (PROTONIX) 40 MG tablet TAKE 1 TABLET BY MOUTH TWICE DAILY BEFORE MEAL(S)   traZODone (DESYREL) 100 MG tablet Take 1 tablet (100 mg total) by mouth at bedtime as needed for sleep.   [DISCONTINUED] alosetron (LOTRONEX) 0.5 MG tablet Take 1 tablet (0.5 mg total) by mouth 2 (two) times daily. (Patient not  taking: Reported on 08/16/2023)   [DISCONTINUED] hydrOXYzine (ATARAX) 50 MG tablet Take 1 tablet (50 mg total) by mouth every 6 (six) hours as needed for anxiety. (Patient not taking: Reported on 08/16/2023)   No facility-administered encounter medications on file as of 08/16/2023.    Past Medical History:  Diagnosis Date   Acute posttraumatic stress disorder    Allergy    Anal fistula    Arm fracture    right and left   Asthma    Bipolar 2 disorder (HCC)    Borderline personality disorder (HCC) 05/23/2022   Cannabis use disorder    Cocaine use disorder, moderate, in early remission (HCC)    Complication of anesthesia    "wakes up angry for about 5 minutes"   Depression    Endometriosis    Endometriosis    Foot pain    right foot hurts since last night no reported injury per pt on 01-25-2022   GERD (gastroesophageal reflux disease)    Grief associated with loss of fetus    History of 2019 novel coronavirus disease (COVID-19) 04/29/2020   + Ag test - can see in Care everywhere   History of chicken pox    had 4 or 5 times as child   History of COVID-19 04/29/2020   mild all symptoms resolved   IBS (irritable bowel syndrome)    Irritable bowel    Leg fracture, left    Psychogenic nonepileptic seizure    last seizure  1 month ago per pt on 01-25-2022 saw dr Yvone Neu neurology   Seizure [facility])    last seizure 12-28-2021 per pt on 01-25-2022   Self-injurious behavior    Shingles    intermittent outbreaks   Ulcerative colitis [facility])    Wears glasses     Past Surgical History:  Procedure Laterality Date   APPENDECTOMY     BIOPSY  06/06/2020   Procedure: BIOPSY;  Surgeon: Iva Boop, MD;  Location: [facility];  Service: Endoscopy;;   COLONOSCOPY WITH PROPOFOL N/A 06/06/2020   Procedure: COLONOSCOPY WITH PROPOFOL;  Surgeon: Iva Boop, MD;  [facility];  Service: Endoscopy;  Laterality: N/A;   DIAGNOSTIC LAPAROSCOPY WITH REMOVAL OF ECTOPIC PREGNANCY Right  12/11/2021   Procedure: LAPAROSCOPIC RIGHT SALPINGECTOMY WITH REMOVAL OF ECTOPIC PREGNANCY, lysis of adhesions;  Surgeon: Noland Fordyce, MD;  [facility]: [facility];  Service: Gynecology;  Laterality: Right;   ESOPHAGOGASTRODUODENOSCOPY (EGD) WITH PROPOFOL N/A 06/06/2020   Procedure: ESOPHAGOGASTRODUODENOSCOPY (EGD) WITH PROPOFOL;  Surgeon: Iva Boop, MD;  [facility];  Service: Endoscopy;  Laterality: N/A;   HAND SURGERY Left    x3   LIGATION OF INTERNAL FISTULA TRACT N/A 03/29/2023   Procedure: LIGATION OF INTERNAL FISTULA TRACT;  Surgeon: Romie Levee, MD;  [facility]: [facility];  Service: General;  Laterality: N/A;   PLACEMENT OF SETON N/A 01/30/2022   Procedure: PLACEMENT OF SETON;  Surgeon: Romie Levee, MD;  [facility]: [facility];  Service: General;  Laterality: N/A;   RECTAL EXAM UNDER ANESTHESIA N/A 01/30/2022   Procedure: ANAL EXAM UNDER ANESTHESIA;  Surgeon: Romie Levee, MD;  [facility];  Service: General;  Laterality: N/A;   TUBAL LIGATION      Family History  Problem Relation Age of Onset   Colitis Mother    Ulcerative colitis Mother    Heart disease Mother    Other Mother    Colitis Father    Ulcerative colitis Father    Diabetes Father    Stomach cancer Paternal Aunt    Colitis Paternal Grandmother    Ulcerative colitis Paternal Grandmother    Colon cancer Neg Hx    Pancreatic cancer Neg Hx    Esophageal cancer Neg Hx     Review of Systems  Constitutional:  Negative for malaise/fatigue and weight loss.  HENT:  Negative for ear pain and tinnitus.   Eyes:  Negative for blurred vision, double vision and photophobia.  Respiratory:  Negative for cough and shortness of breath.   Cardiovascular:  Negative for chest pain, palpitations, orthopnea and leg swelling.  Gastrointestinal:  Positive for abdominal pain. Negative for nausea and vomiting.  Genitourinary:  Negative for dysuria, flank pain, frequency, hematuria and urgency.  Musculoskeletal:  Positive  for back pain. Negative for myalgias and neck pain.  Neurological:  Negative for dizziness, tingling, tremors, focal weakness, weakness and headaches.  Psychiatric/Behavioral:  Negative for depression and suicidal ideas. The patient is not nervous/anxious and does not have insomnia.       Objective    BP 109/70   Pulse 73   Ht 5\' 7"  (1.702 m)   Wt 280 lb 11.2 oz (127.3 kg)   SpO2 99%   BMI 43.96 kg/m   Physical Exam Constitutional:      Appearance: Normal appearance.  Cardiovascular:     Rate and Rhythm: Normal rate and regular rhythm.     Pulses: Normal pulses.     Heart sounds: Normal heart sounds.  Pulmonary:  Effort: Pulmonary effort is normal.     Breath sounds: Normal breath sounds.  Abdominal:     General: Abdomen is protuberant. Bowel sounds are normal.     Palpations: Abdomen is soft. There is no hepatomegaly or splenomegaly.     Tenderness: There is abdominal tenderness in the right upper quadrant. There is no right CVA tenderness, left CVA tenderness or guarding. Negative signs include Murphy's sign and McBurney's sign.  Neurological:     Mental Status: She is alert.     Assessment & Plan:   1. Encounter to establish care Patient is a 37 year old female who presents today to establish care with primary care at [facility]. Reviewed the past medical history, family history, social history, surgical history, medications and allergies today- updates made as indicated. She has concerns today about having paperwork completed to be able to receive medication for her ADHD.   2. RUQ abdominal pain Physical exam with normal bowel sounds and soft to palpation. Noted tenderness to palpation in abdominal RUQ. No guarding, rebound tenderness, or abdominal distention. Will assess liver function, pancreatic function, and lipid panel today. Will also order RUQ [place].  - Lipid panel - Comprehensive metabolic panel - Lipase - Amylase - US Abdomen Limited RUQ (LIVER/GB);  Future  3. Endometriosis Patient has a history of endometriosis and would like to establish with an OBGYN.  - Ambulatory referral to Obstetrics / Gynecology  4. Borderline personality disorder [facility]) Patient being followed by [facility].   5. Severe obesity (BMI >= 40) (HCC) Discussed importance of healthy lifestyle modifications, including healthy nutrition and daily exercise.   6. Attention deficit hyperactivity disorder (ADHD), combined type Patient being followed by [facility]. EKG repeated today, due to significant artifact from ED visit on 07/15/2023. Patient denies chest pain, palpitations, racing heart, shortness of breath, lower extremity edema. No concerns present on EKG. NSR 60s. Paperwork completed for patient to receive stimulants and provided to patient.    Return in about 4 weeks (around 09/13/2023) for Physical.   Alyson Reedy, FNP

## 2023-08-16 NOTE — Patient Instructions (Signed)

## 2023-08-17 LAB — COMPREHENSIVE METABOLIC PANEL
ALT: 16 [IU]/L (ref 0–32)
AST: 16 [IU]/L (ref 0–40)
Albumin: 3.8 g/dL — ABNORMAL LOW (ref 3.9–4.9)
Alkaline Phosphatase: 100 [IU]/L (ref 44–121)
BUN/Creatinine Ratio: 15 (ref 9–23)
BUN: 10 mg/dL (ref 6–20)
Bilirubin Total: 0.2 mg/dL (ref 0.0–1.2)
CO2: 21 mmol/L (ref 20–29)
Calcium: 9.2 mg/dL (ref 8.7–10.2)
Chloride: 105 mmol/L (ref 96–106)
Creatinine, Ser: 0.68 mg/dL (ref 0.57–1.00)
Globulin, Total: 2.6 g/dL (ref 1.5–4.5)
Glucose: 84 mg/dL (ref 70–99)
Potassium: 4.6 mmol/L (ref 3.5–5.2)
Sodium: 141 mmol/L (ref 134–144)
Total Protein: 6.4 g/dL (ref 6.0–8.5)
eGFR: 116 mL/min/{1.73_m2} (ref >59)

## 2023-08-17 LAB — LIPASE: Lipase: 34 U/L (ref 14–72)

## 2023-08-17 LAB — AMYLASE: Amylase: 22 U/L — ABNORMAL LOW (ref 31–110)

## 2023-08-17 LAB — LIPID PANEL
Chol/HDL Ratio: 5.6 {ratio} — ABNORMAL HIGH (ref 0.0–4.4)
Cholesterol, Total: 235 mg/dL — ABNORMAL HIGH (ref 100–199)
HDL: 42 mg/dL (ref >39)
LDL Chol Calc (NIH): 154 mg/dL — ABNORMAL HIGH (ref 0–99)
Triglycerides: 215 mg/dL — ABNORMAL HIGH (ref 0–149)
VLDL Cholesterol Cal: 39 mg/dL (ref 5–40)

## 2023-08-19 ENCOUNTER — Encounter (HOSPITAL_BASED_OUTPATIENT_CLINIC_OR_DEPARTMENT_OTHER): Payer: Self-pay

## 2023-08-21 ENCOUNTER — Encounter (HOSPITAL_BASED_OUTPATIENT_CLINIC_OR_DEPARTMENT_OTHER): Payer: Self-pay

## 2023-08-21 DIAGNOSIS — F902 Attention-deficit hyperactivity disorder, combined type: Secondary | ICD-10-CM | POA: Diagnosis not present

## 2023-08-22 DIAGNOSIS — F902 Attention-deficit hyperactivity disorder, combined type: Secondary | ICD-10-CM | POA: Diagnosis not present

## 2023-08-26 ENCOUNTER — Encounter (HOSPITAL_BASED_OUTPATIENT_CLINIC_OR_DEPARTMENT_OTHER): Payer: 59 | Admitting: Certified Nurse Midwife

## 2023-08-29 ENCOUNTER — Encounter (HOSPITAL_BASED_OUTPATIENT_CLINIC_OR_DEPARTMENT_OTHER): Payer: Self-pay | Admitting: Family Medicine

## 2023-08-30 ENCOUNTER — Ambulatory Visit
Admission: RE | Admit: 2023-08-30 | Discharge: 2023-08-30 | Disposition: A | Discharge status: Home / Self Care | Payer: No Typology Code available for payment source | Source: Ambulatory Visit | Attending: Internal Medicine | Admitting: Internal Medicine

## 2023-08-30 ENCOUNTER — Encounter (HOSPITAL_COMMUNITY): Payer: Self-pay

## 2023-08-30 ENCOUNTER — Ambulatory Visit
Admission: RE | Admit: 2023-08-30 | Discharge: 2023-08-30 | Disposition: A | Discharge status: Home / Self Care | Payer: 59 | Source: Ambulatory Visit | Attending: Family Medicine | Admitting: Family Medicine

## 2023-08-30 ENCOUNTER — Emergency Department (HOSPITAL_COMMUNITY)
Admission: EM | Admit: 2023-08-30 | Discharge: 2023-08-30 | Disposition: A | Discharge status: Home / Self Care | Payer: No Typology Code available for payment source | Attending: Emergency Medicine | Admitting: Emergency Medicine

## 2023-08-30 ENCOUNTER — Emergency Department (HOSPITAL_COMMUNITY): Payer: No Typology Code available for payment source

## 2023-08-30 ENCOUNTER — Other Ambulatory Visit: Payer: Self-pay

## 2023-08-30 VITALS — BP 133/93 | HR 90 | Temp 100.0°F | Resp 18

## 2023-08-30 DIAGNOSIS — R102 Pelvic and perineal pain: Secondary | ICD-10-CM

## 2023-08-30 DIAGNOSIS — R1110 Vomiting, unspecified: Secondary | ICD-10-CM | POA: Insufficient documentation

## 2023-08-30 DIAGNOSIS — R1012 Left upper quadrant pain: Secondary | ICD-10-CM | POA: Diagnosis not present

## 2023-08-30 DIAGNOSIS — R109 Unspecified abdominal pain: Secondary | ICD-10-CM | POA: Diagnosis present

## 2023-08-30 DIAGNOSIS — F902 Attention-deficit hyperactivity disorder, combined type: Secondary | ICD-10-CM | POA: Diagnosis not present

## 2023-08-30 DIAGNOSIS — R1032 Left lower quadrant pain: Secondary | ICD-10-CM | POA: Insufficient documentation

## 2023-08-30 DIAGNOSIS — F3181 Bipolar II disorder: Secondary | ICD-10-CM | POA: Diagnosis not present

## 2023-08-30 DIAGNOSIS — R197 Diarrhea, unspecified: Secondary | ICD-10-CM | POA: Insufficient documentation

## 2023-08-30 DIAGNOSIS — R1011 Right upper quadrant pain: Secondary | ICD-10-CM

## 2023-08-30 LAB — CBC
HCT: 39.9 % (ref 36.0–46.0)
Hemoglobin: 12.9 g/dL (ref 12.0–15.0)
MCH: 28.5 pg (ref 26.0–34.0)
MCHC: 32.3 g/dL (ref 30.0–36.0)
MCV: 88.3 fL (ref 80.0–100.0)
Platelets: 308 10*3/uL (ref 150–400)
RBC: 4.52 MIL/uL (ref 3.87–5.11)
RDW: 13.2 % (ref 11.5–15.5)
WBC: 7.2 10*3/uL (ref 4.0–10.5)
nRBC: 0 % (ref 0.0–0.2)

## 2023-08-30 LAB — WET PREP, GENITAL
Clue Cells Wet Prep HPF POC: NONE SEEN
Sperm: NONE SEEN
Trich, Wet Prep: NONE SEEN
WBC, Wet Prep HPF POC: <10 (ref <10)
Yeast Wet Prep HPF POC: NONE SEEN

## 2023-08-30 LAB — POCT URINALYSIS DIP (MANUAL ENTRY)
Bilirubin, UA: NEGATIVE
Glucose, UA: NEGATIVE mg/dL
Ketones, POC UA: NEGATIVE mg/dL
Leukocytes, UA: NEGATIVE
Nitrite, UA: NEGATIVE
Protein Ur, POC: NEGATIVE mg/dL
Spec Grav, UA: >=1.03 — AB (ref 1.010–1.025)
Urobilinogen, UA: 0.2 U/dL
pH, UA: 5.5 (ref 5.0–8.0)

## 2023-08-30 LAB — LIPASE, BLOOD: Lipase: 25 U/L (ref 11–51)

## 2023-08-30 LAB — COMPREHENSIVE METABOLIC PANEL
ALT: 16 U/L (ref 0–44)
AST: 18 U/L (ref 15–41)
Albumin: 3.4 g/dL — ABNORMAL LOW (ref 3.5–5.0)
Alkaline Phosphatase: 88 U/L (ref 38–126)
Anion gap: 8 (ref 5–15)
BUN: 8 mg/dL (ref 6–20)
CO2: 21 mmol/L — ABNORMAL LOW (ref 22–32)
Calcium: 8.5 mg/dL — ABNORMAL LOW (ref 8.9–10.3)
Chloride: 104 mmol/L (ref 98–111)
Creatinine, Ser: 0.7 mg/dL (ref 0.44–1.00)
GFR, Estimated: >60 mL/min (ref >60)
Glucose, Bld: 100 mg/dL — ABNORMAL HIGH (ref 70–99)
Potassium: 3.8 mmol/L (ref 3.5–5.1)
Sodium: 133 mmol/L — ABNORMAL LOW (ref 135–145)
Total Bilirubin: 0.4 mg/dL (ref 0.3–1.2)
Total Protein: 6.7 g/dL (ref 6.5–8.1)

## 2023-08-30 LAB — POCT URINE PREGNANCY: Preg Test, Ur: NEGATIVE

## 2023-08-30 MED ORDER — LIDOCAINE HCL (PF) 1 % IJ SOLN
1.0000 mL | Freq: Once | INTRAMUSCULAR | Status: AC
  Administered 2023-08-30: 2.1 mL
  Filled 2023-08-30: qty 30

## 2023-08-30 MED ORDER — FLUCONAZOLE 150 MG PO TABS: 150.0000 mg | ORAL_TABLET | Freq: Every day | ORAL | 0 refills | Status: AC

## 2023-08-30 MED ORDER — MORPHINE SULFATE (PF) 4 MG/ML IV SOLN
4.0000 mg | Freq: Once | INTRAVENOUS | Status: AC
  Administered 2023-08-30: 4 mg via INTRAVENOUS
  Filled 2023-08-30: qty 1

## 2023-08-30 MED ORDER — DOXYCYCLINE HYCLATE 100 MG PO TABS: 100.0000 mg | ORAL_TABLET | Freq: Two times a day (BID) | ORAL | 0 refills | Status: AC

## 2023-08-30 MED ORDER — KETOROLAC TROMETHAMINE 15 MG/ML IJ SOLN
15.0000 mg | Freq: Once | INTRAMUSCULAR | Status: DC
  Filled 2023-08-30: qty 1

## 2023-08-30 MED ORDER — CEFTRIAXONE SODIUM 1 G IJ SOLR
500.0000 mg | Freq: Once | INTRAMUSCULAR | Status: AC
  Administered 2023-08-30: 500 mg via INTRAMUSCULAR
  Filled 2023-08-30: qty 10

## 2023-08-30 NOTE — ED Triage Notes (Signed)
Sent by UC. Patient reports abd pain, nausea, vomiting, diarrhea, and left sided pelvic pain x 3 days. Patient reports she had a fever at urgent care that is new.

## 2023-08-30 NOTE — ED Notes (Signed)
Patient is being discharged from the Urgent Care and sent to the Emergency Department via POV (friend) . Per [place], [place], patient is in need of higher level of care due to PID, ovarian cyst, cute gynecological emergency. Patient is aware and verbalizes understanding of plan of care.  Vitals:   08/30/23 1100  BP: (!) 133/93  Pulse: 90  Resp: 18  Temp: 100 F (37.8 C)  SpO2: 97%

## 2023-08-30 NOTE — ED Provider Notes (Signed)
[facility] - URGENT CARE CENTER  Note:  This document was prepared using Conservation officer, historic buildings and may include unintentional dictation errors.  MRN: [id number] DOB: [birth year]  Subjective:   Barbara Orozco is a 37 y.o. female with past medical history of endometriosis, IBS presenting for 3-day history of acute onset persistent and worsening left lower quadrant/pelvic pain with nausea.  No urinary symptoms.  No vaginal discharge.  However, she has had dyspareunia for the past 2 weeks.  No concern for sexually transmitted infection or BV.  Reports that she was just tested for this and was negative.  Denies history of ulcerative colitis, diverticulitis.  Reports that she was established to have IBS.  She also has PCOS.  No current facility-administered medications for this encounter.  Current Outpatient Medications:    ARIPiprazole (ABILIFY) 2 MG tablet, Take 1 tablet (2 mg total) by mouth daily., Disp: 30 tablet, Rfl: 3   busPIRone (BUSPAR) 5 MG tablet, Take 1 tablet (5 mg total) by mouth daily at 12 noon., Disp: 60 tablet, Rfl: 3   cholestyramine (QUESTRAN) 4 GM/DOSE powder, Take 2 g by mouth 3 (three) times daily with meals., Disp: , Rfl:    clonazePAM (KLONOPIN) 0.5 MG tablet, Take 1 tablet (0.5 mg total) by mouth 3 (three) times daily., Disp: 30 tablet, Rfl: 0   dicyclomine (BENTYL) 10 MG capsule, Take 10 mg by mouth daily as needed for spasms., Disp: , Rfl:    lamoTRIgine (LAMICTAL) 25 MG tablet, Take 2 tablets (50 mg total) by mouth daily., Disp: 30 tablet, Rfl: 3   MELATONIN PO, Take 1 tablet by mouth at bedtime., Disp: , Rfl:    ondansetron (ZOFRAN-ODT) 4 MG disintegrating tablet, Take 1 tablet (4 mg total) by mouth every 8 (eight) hours as needed for nausea or vomiting., Disp: 30 tablet, Rfl: 5   pantoprazole (PROTONIX) 40 MG tablet, TAKE 1 TABLET BY MOUTH TWICE DAILY BEFORE MEAL(S), Disp: 180 tablet, Rfl: 3   traZODone (DESYREL) 100 MG tablet, Take 1 tablet (100 mg  total) by mouth at bedtime as needed for sleep., Disp: 30 tablet, Rfl: 3   Allergies  Allergen Reactions   Bee Venom Anaphylaxis   Pork Allergy Anaphylaxis   Tramadol Rash and Other (See Comments)    Pt has Crohn's  Disease - Tramadol makes her stomach hurt/cramps   Cinnamon Itching and Other (See Comments)    Makes mouth burn and itch   Nsaids Other (See Comments)    Avoid due to Crohn's disease   Lithium Rash    Past Medical History:  Diagnosis Date   Acute posttraumatic stress disorder    ADHD (attention deficit hyperactivity disorder), combined type    Allergy    Anal fistula    Arm fracture    right and left   Asthma    Bipolar 2 disorder (HCC)    Borderline personality disorder (HCC) 05/23/2022   Cannabis use disorder    Cocaine use disorder, moderate, in early remission (HCC)    Complication of anesthesia    "wakes up angry for about 5 minutes"   Depression    Endometriosis    Endometriosis    Foot pain    right foot hurts since last night no reported injury per pt on 01-25-2022   GERD (gastroesophageal reflux disease)    Grief associated with loss of fetus    History of 2019 novel coronavirus disease (COVID-19) 04/29/2020   + Ag test - can see in Care everywhere  History of chicken pox    had 4 or 5 times as child   History of COVID-19 04/29/2020   mild all symptoms resolved   IBS (irritable bowel syndrome)    Irritable bowel    Leg fracture, left    Psychogenic nonepileptic seizure    last seizure 1 month ago per pt on 01-25-2022 saw dr Yvone Neu neurology   Seizure [facility])    last seizure 12-28-2021 per pt on 01-25-2022   Self-injurious behavior    Shingles    intermittent outbreaks   Ulcerative colitis (HCC)    Wears glasses      Past Surgical History:  Procedure Laterality Date   APPENDECTOMY     BIOPSY  06/06/2020   Procedure: BIOPSY;  Surgeon: Iva Boop, MD;  [facility];  Service: Endoscopy;;   COLONOSCOPY WITH PROPOFOL N/A  06/06/2020   Procedure: COLONOSCOPY WITH PROPOFOL;  Surgeon: Iva Boop, MD;  [facility];  Service: Endoscopy;  Laterality: N/A;   DIAGNOSTIC LAPAROSCOPY WITH REMOVAL OF ECTOPIC PREGNANCY Right 12/11/2021   Procedure: LAPAROSCOPIC RIGHT SALPINGECTOMY WITH REMOVAL OF ECTOPIC PREGNANCY, lysis of adhesions;  Surgeon: Noland Fordyce, MD;  [facility];  Service: Gynecology;  Laterality: Right;   ESOPHAGOGASTRODUODENOSCOPY (EGD) WITH PROPOFOL N/A 06/06/2020   Procedure: ESOPHAGOGASTRODUODENOSCOPY (EGD) WITH PROPOFOL;  Surgeon: Iva Boop, MD;  [facility];  Service: Endoscopy;  Laterality: N/A;   HAND SURGERY Left    x3   LIGATION OF INTERNAL FISTULA TRACT N/A 03/29/2023   Procedure: LIGATION OF INTERNAL FISTULA TRACT;  Surgeon: Romie Levee, MD;  [facility];  Service: General;  Laterality: N/A;   PLACEMENT OF SETON N/A 01/30/2022   Procedure: PLACEMENT OF SETON;  Surgeon: Romie Levee, MD;  [facility];  Service: General;  Laterality: N/A;   RECTAL EXAM UNDER ANESTHESIA N/A 01/30/2022   Procedure: ANAL EXAM UNDER ANESTHESIA;  Surgeon: Romie Levee, MD;  [facility]: [facility];  Service: General;  Laterality: N/A;   TUBAL LIGATION      Family History  Problem Relation Age of Onset   Colitis Mother    Ulcerative colitis Mother    Heart disease Mother    Other Mother    Colitis Father    Ulcerative colitis Father    Diabetes Father    Stomach cancer Paternal Aunt    Colitis Paternal Grandmother    Ulcerative colitis Paternal Grandmother    Colon cancer Neg Hx    Pancreatic cancer Neg Hx    Esophageal cancer Neg Hx     Social History   Tobacco Use   Smoking status: Former    Types: Cigarettes   Smokeless tobacco: Never   Tobacco comments:    Social smoker quit 6-8 yrs ago per pt on 01-25-2022  Vaping Use   Vaping status: Never Used  Substance Use Topics   Alcohol use: Yes    Alcohol/week: 6.0 standard drinks of alcohol    Types: 2  Glasses of wine, 2 Cans of beer, 2 Shots of liquor per week   Drug use: Yes    Types: Marijuana    ROS   Objective:   Vitals: BP (!) 133/93 (BP Location: Right Arm)   Pulse 90   Temp 100 F (37.8 C) (Oral)   Resp 18   LMP 08/27/2023 (Exact Date) Comment: States she just spotted 3 days ago.  SpO2 97%   Breastfeeding No   Physical Exam Constitutional:      General:  She is not in acute distress.    Appearance: Normal appearance. She is well-developed. She is not ill-appearing, toxic-appearing or diaphoretic.  HENT:     Head: Normocephalic and atraumatic.     Nose: Nose normal.     Mouth/Throat:     Mouth: Mucous membranes are moist.     Pharynx: Oropharynx is clear.  Eyes:     General: No scleral icterus.       Right eye: No discharge.        Left eye: No discharge.     Extraocular Movements: Extraocular movements intact.     Conjunctiva/sclera: Conjunctivae normal.  Cardiovascular:     Rate and Rhythm: Normal rate.  Pulmonary:     Effort: Pulmonary effort is normal.  Abdominal:     General: Bowel sounds are normal. There is no distension.     Palpations: Abdomen is soft. There is no mass.     Tenderness: There is abdominal tenderness (left pelvic region) in the suprapubic area and left lower quadrant. There is no right CVA tenderness, left CVA tenderness, guarding or rebound.  Skin:    General: Skin is warm and dry.  Neurological:     General: No focal deficit present.     Mental Status: She is alert and oriented to person, place, and time.  Psychiatric:        Mood and Affect: Mood normal.        Behavior: Behavior normal.        Thought Content: Thought content normal.        Judgment: Judgment normal.     Results for orders placed or performed during the hospital encounter of 08/30/23 (from the past 24 hour(s))  POCT urine pregnancy     Status: None   Collection Time: 08/30/23 11:14 AM  Result Value Ref Range   Preg Test, Ur Negative Negative  POCT  urinalysis dipstick     Status: Abnormal   Collection Time: 08/30/23 11:14 AM  Result Value Ref Range   Color, UA yellow yellow   Clarity, UA clear clear   Glucose, UA negative negative mg/dL   Bilirubin, UA negative negative   Ketones, POC UA negative negative mg/dL   Spec Grav, UA >=6.644 (A) 1.010 - 1.025   Blood, UA moderate (A) negative   pH, UA 5.5 5.0 - 8.0   Protein Ur, POC negative negative mg/dL   Urobilinogen, UA 0.2 0.2 or 1.0 E.U./dL   Nitrite, UA Negative Negative   Leukocytes, UA Negative Negative    Assessment and Plan :   PDMP not reviewed this encounter.  1. Pelvic pain    Patient is in need of a higher level of care than we can provide in the urgent care setting.  Warrants rule out of an acute gynecologic emergency, PID, tubo-ovarian abscess, ovarian torsion.  Patient declined any medications for her pain in clinic.  Advised that she present to the emergency room now.  Presents with her spouse who contracts for safety and will take her there now.   Wallis Bamberg, [place] 08/30/23 1143

## 2023-08-30 NOTE — ED Provider Notes (Signed)
[place] EMERGENCY DEPARTMENT AT [facility] Provider Note   CSN: [id number] Arrival date & time: 08/30/23  1159     History {Add pertinent medical, surgical, social history, OB history to HPI:1} Chief Complaint  Patient presents with   Pelvic Pain    Barbara Orozco is a 37 y.o. female.   Pelvic Pain   Patient has a history of acid reflux-year-old bowel syndrome endometriosis, seizures who presents ED with complaints of abdominal pain.  Patient states has been having pain in her lower abdomen and pelvic area for the last 3 days.  Patient also has been having issues with vomiting as well as diarrhea.  She has had some low-grade fevers up to 100.  Patient went to an urgent care today.  She was noted to have abdominal tenderness.  States her last menstrual period was irregular.  They noted she had a elevated temperature and recommended ED evaluation    Home Medications Prior to Admission medications   Medication Sig Start Date End Date Taking? Authorizing Provider  ARIPiprazole (ABILIFY) 2 MG tablet Take 1 tablet (2 mg total) by mouth daily. 07/20/23   Sarina Ill, DO  busPIRone (BUSPAR) 5 MG tablet Take 1 tablet (5 mg total) by mouth daily at 12 noon. 07/19/23   Sarina Ill, DO  cholestyramine Lanetta Inch) 4 GM/DOSE powder Take 2 g by mouth 3 (three) times daily with meals.    [provider]  clonazePAM (KLONOPIN) 0.5 MG tablet Take 1 tablet (0.5 mg total) by mouth 3 (three) times daily. 07/19/23   Sarina Ill, DO  dicyclomine (BENTYL) 10 MG capsule Take 10 mg by mouth daily as needed for spasms.    [provider]  lamoTRIgine (LAMICTAL) 25 MG tablet Take 2 tablets (50 mg total) by mouth daily. 07/20/23   Sarina Ill, DO  MELATONIN PO Take 1 tablet by mouth at bedtime.    [provider]  ondansetron (ZOFRAN-ODT) 4 MG disintegrating tablet Take 1 tablet (4 mg total) by mouth every 8 (eight) hours as  needed for nausea or vomiting. 01/15/23   Iva Boop, MD  pantoprazole (PROTONIX) 40 MG tablet TAKE 1 TABLET BY MOUTH TWICE DAILY BEFORE MEAL(S) 03/06/23   Iva Boop, MD  traZODone (DESYREL) 100 MG tablet Take 1 tablet (100 mg total) by mouth at bedtime as needed for sleep. 07/19/23   Sarina Ill, DO      Allergies    Bee venom, Pork allergy, Tramadol, Cinnamon, Nsaids, and Lithium    Review of Systems   Review of Systems  Genitourinary:  Positive for pelvic pain.    Physical Exam Updated Vital Signs BP 132/78 (BP Location: Left Arm)   Pulse 85   Temp 99 F (37.2 C) (Oral)   Resp 18   Ht 1.702 m (5\' 7" )   Wt 127.3 kg   LMP 08/27/2023 (Exact Date) Comment: States she just spotted 3 days ago.  SpO2 100%   BMI 43.96 kg/m  Physical Exam Vitals and nursing note reviewed.  Constitutional:      General: She is not in acute distress.    Appearance: She is well-developed.  HENT:     Head: Normocephalic and atraumatic.     Right Ear: External ear normal.     Left Ear: External ear normal.  Eyes:     General: No scleral icterus.       Right eye: No discharge.  Left eye: No discharge.     Conjunctiva/sclera: Conjunctivae normal.  Neck:     Trachea: No tracheal deviation.  Cardiovascular:     Rate and Rhythm: Normal rate and regular rhythm.  Pulmonary:     Effort: Pulmonary effort is normal. No respiratory distress.     Breath sounds: Normal breath sounds. No stridor. No wheezing or rales.  Abdominal:     General: Bowel sounds are normal. There is no distension.     Palpations: Abdomen is soft.     Tenderness: There is abdominal tenderness in the suprapubic area and left lower quadrant. There is no guarding or rebound.  Musculoskeletal:        General: No tenderness or deformity.     Cervical back: Neck supple.  Skin:    General: Skin is warm and dry.     Findings: No rash.  Neurological:     General: No focal deficit present.     Mental Status:  She is alert.     Cranial Nerves: No cranial nerve deficit, dysarthria or facial asymmetry.     Sensory: No sensory deficit.     Motor: No abnormal muscle tone or seizure activity.     Coordination: Coordination normal.  Psychiatric:        Mood and Affect: Mood normal.     ED Results / Procedures / Treatments   Labs (all labs ordered are listed, but only abnormal results are displayed) Labs Reviewed  COMPREHENSIVE METABOLIC PANEL - Abnormal; Notable for the following components:      Result Value   Sodium 133 (*)    CO2 21 (*)    Glucose, Bld 100 (*)    Calcium 8.5 (*)    Albumin 3.4 (*)    All other components within normal limits  WET PREP, GENITAL  CBC  LIPASE, BLOOD  RPR  GC/CHLAMYDIA PROBE AMP ([place]) NOT AT [facility]    EKG None  Radiology US Abdomen Limited RUQ (LIVER/GB)  Result Date: 08/30/2023 CLINICAL DATA:  LUQ pain x1 week EXAM: ULTRASOUND ABDOMEN LIMITED RIGHT UPPER QUADRANT COMPARISON:  Mar 31, 2017 FINDINGS: Gallbladder: No gallstones or wall thickening visualized. There is a small echogenic focus noted along the gallbladder wall which measures up to 8 mm which may reflect an adherent sludge ball versus a small polyp. No sonographic Murphy sign noted by sonographer. Common bile duct: Diameter: Visualized portion measures 5 mm, within normal limits. Liver: No focal lesion identified. Upper limits of normal in parenchymal echogenicity. Portal vein is patent on color Doppler imaging with normal direction of blood flow towards the liver. Other: None. IMPRESSION: 1. No sonographic etiology for abdominal pain is identified. 2. There is a small echogenic focus noted along the gallbladder wall which measures up to 8 mm which may reflect an adherent sludge ball versus a small polyp. Recommend follow-up ultrasound in 6 months to assess for stability. Electronically Signed   By: Meda Klinefelter M.D.   On: 08/30/2023 11:29    Procedures Procedures  {Document cardiac  monitor, telemetry assessment procedure when appropriate:1}  Medications Ordered in ED Medications - No data to display  ED Course/ Medical Decision Making/ A&P   {   Click here for ABCD2, HEART and other calculatorsREFRESH Note before signing :1}                              Medical Decision Making Amount and/or Complexity of Data  Reviewed Labs: ordered.   ***  {Document critical care time when appropriate:1} {Document review of labs and clinical decision tools ie heart score, Chads2Vasc2 etc:1}  {Document your independent review of radiology images, and any outside records:1} {Document your discussion with family members, caretakers, and with consultants:1} {Document social determinants of health affecting pt's care:1} {Document your decision making why or why not admission, treatments were needed:1} Final Clinical Impression(s) / ED Diagnoses Final diagnoses:  None    Rx / DC Orders ED Discharge Orders     None

## 2023-08-30 NOTE — ED Triage Notes (Signed)
Pt reports left lower quadrant pain and nausea x 3 days. Reports she has bad endometriosis. Pt concern for ectopic pregnancy, as pain feel the same when she had an ectopic pregnancy.  Pain medication (leftover) gives no relief.

## 2023-08-30 NOTE — Discharge Instructions (Signed)
The antibiotics to treat for a possible infection.  Follow-up with your OB/GYN doctor to be rechecked.  Return to the ED for worsening symptoms fevers chills

## 2023-08-31 LAB — RPR: RPR Ser Ql: NONREACTIVE

## 2023-09-02 LAB — GC/CHLAMYDIA PROBE AMP (~~LOC~~) NOT AT ARMC
Chlamydia: NEGATIVE
Comment: NEGATIVE
Comment: NORMAL
Neisseria Gonorrhea: NEGATIVE

## 2023-09-06 ENCOUNTER — Encounter (HOSPITAL_BASED_OUTPATIENT_CLINIC_OR_DEPARTMENT_OTHER): Payer: Self-pay | Admitting: Certified Nurse Midwife

## 2023-09-06 ENCOUNTER — Other Ambulatory Visit (HOSPITAL_COMMUNITY)
Admission: RE | Admit: 2023-09-06 | Discharge: 2023-09-06 | Disposition: A | Discharge status: Home / Self Care | Payer: No Typology Code available for payment source | Source: Ambulatory Visit | Attending: Certified Nurse Midwife | Admitting: Certified Nurse Midwife

## 2023-09-06 ENCOUNTER — Ambulatory Visit (INDEPENDENT_AMBULATORY_CARE_PROVIDER_SITE_OTHER): Payer: No Typology Code available for payment source | Admitting: Certified Nurse Midwife

## 2023-09-06 VITALS — BP 115/77 | HR 61 | Ht 67.0 in | Wt 284.6 lb

## 2023-09-06 DIAGNOSIS — Z124 Encounter for screening for malignant neoplasm of cervix: Secondary | ICD-10-CM

## 2023-09-06 DIAGNOSIS — N979 Female infertility, unspecified: Secondary | ICD-10-CM | POA: Diagnosis not present

## 2023-09-06 DIAGNOSIS — R102 Pelvic and perineal pain: Secondary | ICD-10-CM

## 2023-09-06 DIAGNOSIS — N946 Dysmenorrhea, unspecified: Secondary | ICD-10-CM | POA: Diagnosis not present

## 2023-09-06 DIAGNOSIS — F410 Panic disorder [episodic paroxysmal anxiety] without agoraphobia: Secondary | ICD-10-CM | POA: Diagnosis not present

## 2023-09-06 DIAGNOSIS — F3181 Bipolar II disorder: Secondary | ICD-10-CM | POA: Diagnosis not present

## 2023-09-06 DIAGNOSIS — F4312 Post-traumatic stress disorder, chronic: Secondary | ICD-10-CM | POA: Diagnosis not present

## 2023-09-06 NOTE — Progress Notes (Unsigned)
GYNECOLOGY  VISIT  CC:   pelvic pain  HPI: 37 y.o. G62P0020 Divorced White or Caucasian female here for pelvic pain/follow-up from ED. Patient reports a long-standing history of "Endometriosis" with painful and heavy periods that are typically monthly. She has a hx of 2 Ectopic pregnancies. Reports hx Right Salpingectomy, Left tube intact. Patient states that when MD performed right salpingectomy due to ectopic, MD also noted presence of endometriosis on left fallopian tube as well.   Pt had onset of a more "acute" type pelvic pain around 08/30/23. Presented to ED pain pain in lower abdomen and pelvic region since 08/27/23. She was sent from Urgent Care to ED. [place] was limited by body habitus and overlying bowel gas. No fibroids noted. Endometrium 2mm. Right ovary appeared normal. Left ovary appeared normal. Abdominal US performed as well, mostly unremarkable with small echogenic focus along gallbladder wall (?polyp vs. Sludge). Wet prep was negative. GC/CT/RPV negative. CBC wnl. UPT negative. UDS positive for cannabinoid, otherwise negative. Pt was given Rx Doxycycline BID x 7 days.   Pt is sexually active and monogamous with one partner. She states there is no risk of STD (STD was also ruled out in ED). She is agreeable to pap smear today with repeat STI testing.  Past Surgical History:  Procedure Laterality Date   APPENDECTOMY     BIOPSY  06/06/2020   Procedure: BIOPSY;  Surgeon: Iva Boop, MD;  Location: [facility] ENDOS[facility];  Service: Endoscopy;;   COLONOSCOPY WITH PROPOFOL N/A 06/06/2020   Procedure: COLONOSCOPY WITH PROPOFOL;  Surgeon: Iva Boop, MD;  Location: [facility] END[facility];  Service: Endoscopy;  Laterality: N/A;   DIAGNOSTIC LAPAROSCOPY WITH REMOVAL OF ECTOPIC PREGNANCY Right 12/11/2021   Procedure: LAPAROSCOPIC RIGHT SALPINGECTOMY WITH REMOVAL OF ECTOPIC PREGNANCY, lysis of adhesions;  Surgeon: Noland Fordyce, MD;  Location: [facility] OR;  Service: Gynecology;  Laterality: Right;    ESOPHAGOGASTRODUODENOSCOPY (EGD) WITH PROPOFOL N/A 06/06/2020   Procedure: ESOPHAGOGASTRODUODENOSCOPY (EGD) WITH PROPOFOL;  Surgeon: Iva Boop, MD;  Location: [facility];  Service: Endoscopy;  Laterality: N/A;   HAND SURGERY Left    x3   LIGATION OF INTERNAL FISTULA TRACT N/A 03/29/2023   Procedure: LIGATION OF INTERNAL FISTULA TRACT;  Surgeon: Romie Levee, MD;  Location: [facility] ORS;  Service: General;  Laterality: N/A;   PLACEMENT OF SETON N/A 01/30/2022   Procedure: PLACEMENT OF SETON;  Surgeon: Romie Levee, MD;  Location: [facility] ORS;  Service: General;  Laterality: N/A;   RECTAL EXAM UNDER ANESTHESIA N/A 01/30/2022   Procedure: ANAL EXAM UNDER ANESTHESIA;  Surgeon: Romie Levee, MD;  Location: [facility] ORS;  Service: General;  Laterality: N/A;   TUBAL LIGATION        Past Medical History:  Diagnosis Date   Acute posttraumatic stress disorder    ADHD (attention deficit hyperactivity disorder), combined type    Allergy    Anal fistula    Arm fracture    right and left   Asthma    Bipolar 2 disorder (HCC)    Borderline personality disorder (HCC) 05/23/2022   Cannabis use disorder    Cocaine use disorder, moderate, in early remission (HCC)    Complication of anesthesia    "wakes up angry for about 5 minutes"   Depression    Endometriosis    Endometriosis    Foot pain    right foot hurts since last night no reported injury per pt on 01-25-2022   GERD (gastroesophageal reflux disease)    Grief associated with loss  of fetus    History of 2019 novel coronavirus disease (COVID-19) 04/29/2020   + Ag test - can see in Care everywhere   History of chicken pox    had 4 or 5 times as child   History of COVID-19 04/29/2020   mild all symptoms resolved   IBS (irritable bowel syndrome)    Irritable bowel    Leg fracture, left    Psychogenic nonepileptic seizure    last seizure 1 month ago per pt on 01-25-2022 saw dr Yvone Neu neurology   Seizure [facility])    last seizure 12-28-2021 per pt  on 01-25-2022   Self-injurious behavior    Shingles    intermittent outbreaks   Ulcerative colitis (HCC)    Wears glasses     MEDS:   Current Outpatient Medications on File Prior to Visit  Medication Sig Dispense Refill   ARIPiprazole (ABILIFY) 2 MG tablet Take 1 tablet (2 mg total) by mouth daily. 30 tablet 3   busPIRone (BUSPAR) 5 MG tablet Take 1 tablet (5 mg total) by mouth daily at 12 noon. 60 tablet 3   cholestyramine (QUESTRAN) 4 GM/DOSE powder Take 2 g by mouth 3 (three) times daily with meals.     clonazePAM (KLONOPIN) 0.5 MG tablet Take 1 tablet (0.5 mg total) by mouth 3 (three) times daily. 30 tablet 0   dicyclomine (BENTYL) 10 MG capsule Take 10 mg by mouth daily as needed for spasms.     doxycycline (VIBRA-TABS) 100 MG tablet Take 1 tablet (100 mg total) by mouth 2 (two) times daily for 7 days. 14 tablet 0   lamoTRIgine (LAMICTAL) 25 MG tablet Take 2 tablets (50 mg total) by mouth daily. 30 tablet 3   MELATONIN PO Take 1 tablet by mouth at bedtime.     ondansetron (ZOFRAN-ODT) 4 MG disintegrating tablet Take 1 tablet (4 mg total) by mouth every 8 (eight) hours as needed for nausea or vomiting. 30 tablet 5   pantoprazole (PROTONIX) 40 MG tablet TAKE 1 TABLET BY MOUTH TWICE DAILY BEFORE MEAL(S) 180 tablet 3   traZODone (DESYREL) 100 MG tablet Take 1 tablet (100 mg total) by mouth at bedtime as needed for sleep. 30 tablet 3   No current facility-administered medications on file prior to visit.    ALLERGIES: Bee venom, Pork allergy, Tramadol, Cinnamon, Nsaids, and Lithium  SH:  She works from home for [employer]. Has monogamous supportive boyfriend.  ROS  PHYSICAL EXAMINATION:    BP 115/77 (BP Location: Right Arm, Patient Position: Sitting, Cuff Size: Large)   Pulse 61   Ht 5\' 7"  (1.702 m)   Wt 284 lb 9.6 oz (129.1 kg)   LMP 08/27/2023 (Exact Date) Comment: States she just spotted 3 days ago.  BMI 44.57 kg/m     General appearance: alert, cooperative and appears stated  age Neck: no adenopathy, supple, symmetrical, trachea midline and thyroid  CV:  Regular rate and rhythm Lungs:  clear to auscultation, no wheezes, rales or rhonchi, symmetric air entry Breasts: normal appearance, no masses or tenderness, Inspection negative, No nipple retraction or dimpling, No nipple discharge or bleeding, No axillary or supraclavicular adenopathy, Normal to palpation without dominant masses Abdomen: soft, non-tender; bowel sounds normal; no masses,  no organomegaly Lymph:  no inguinal LAD noted  Pelvic: External genitalia:  no lesions              Urethra:  normal appearing urethra with no masses, tenderness or lesions  Bartholins and Skenes: normal                 Vagina: normal mucosa without prolapse or lesions              Cervix: cervical motion tenderness, no bleeding following Pap, no lesions, and nulliparous appearance              Bimanual Exam:  Uterus:  tender, tender on motion, and well supported              Adnexa: no mass, fullness, tenderness              Anus:  normal sphincter tone, no lesions  Chaperone, Hendricks Milo, CMA, was present for exam.  Assessment/Plan: 1. Pelvic pain - Pt will RTO for Pelvic GYN [place] to evaluate uterus/ovaries and follow-up with Dr. Hyacinth Meeker. Pt reports she is interested in pursuing surgical management of endometriosis and evaluation of patency of left fallopian tube (HSG).  - CBC w/Diff - Vitamin D, 25-Hydroxy, Total - Urine Culture - Beta hCG quant (ref lab)  2. Cervical cancer screening - Cytology - PAP( [facility])  3. Infertility, female - Pt opinion is infertility and Hx Ectopic x 2 likely related to endometriosis. Hx Right Salpingectomy due to ectopic. - Anti mullerian hormone  RTO for Pelvic GYN US/transabd/transvag and follow-up with Dr. Hyacinth Meeker to discuss if surgical management of endometriosis with HSG an option. Letta Kocher

## 2023-09-09 DIAGNOSIS — N979 Female infertility, unspecified: Secondary | ICD-10-CM | POA: Insufficient documentation

## 2023-09-09 DIAGNOSIS — N946 Dysmenorrhea, unspecified: Secondary | ICD-10-CM | POA: Insufficient documentation

## 2023-09-09 DIAGNOSIS — R102 Pelvic and perineal pain: Secondary | ICD-10-CM | POA: Insufficient documentation

## 2023-09-09 LAB — URINE CULTURE

## 2023-09-11 ENCOUNTER — Encounter (HOSPITAL_BASED_OUTPATIENT_CLINIC_OR_DEPARTMENT_OTHER): Payer: Self-pay | Admitting: Obstetrics & Gynecology

## 2023-09-11 ENCOUNTER — Ambulatory Visit (INDEPENDENT_AMBULATORY_CARE_PROVIDER_SITE_OTHER): Payer: No Typology Code available for payment source | Admitting: Obstetrics & Gynecology

## 2023-09-11 ENCOUNTER — Ambulatory Visit (INDEPENDENT_AMBULATORY_CARE_PROVIDER_SITE_OTHER): Payer: No Typology Code available for payment source

## 2023-09-11 VITALS — BP 115/70 | HR 56 | Wt 281.0 lb

## 2023-09-11 DIAGNOSIS — F603 Borderline personality disorder: Secondary | ICD-10-CM

## 2023-09-11 DIAGNOSIS — Z9079 Acquired absence of other genital organ(s): Secondary | ICD-10-CM | POA: Diagnosis not present

## 2023-09-11 DIAGNOSIS — N809 Endometriosis, unspecified: Secondary | ICD-10-CM | POA: Diagnosis not present

## 2023-09-11 DIAGNOSIS — R102 Pelvic and perineal pain: Secondary | ICD-10-CM

## 2023-09-11 DIAGNOSIS — N7011 Chronic salpingitis: Secondary | ICD-10-CM

## 2023-09-11 DIAGNOSIS — N946 Dysmenorrhea, unspecified: Secondary | ICD-10-CM

## 2023-09-11 LAB — CYTOLOGY - PAP
Chlamydia: NEGATIVE
Comment: NEGATIVE
Comment: NEGATIVE
Comment: NEGATIVE
Comment: NORMAL
Diagnosis: NEGATIVE
High risk HPV: NEGATIVE
Neisseria Gonorrhea: NEGATIVE
Trichomonas: NEGATIVE

## 2023-09-13 ENCOUNTER — Encounter (HOSPITAL_BASED_OUTPATIENT_CLINIC_OR_DEPARTMENT_OTHER): Payer: 59 | Admitting: Family Medicine

## 2023-09-13 DIAGNOSIS — F4312 Post-traumatic stress disorder, chronic: Secondary | ICD-10-CM | POA: Diagnosis not present

## 2023-09-13 DIAGNOSIS — F902 Attention-deficit hyperactivity disorder, combined type: Secondary | ICD-10-CM | POA: Diagnosis not present

## 2023-09-13 DIAGNOSIS — F410 Panic disorder [episodic paroxysmal anxiety] without agoraphobia: Secondary | ICD-10-CM | POA: Diagnosis not present

## 2023-09-13 DIAGNOSIS — F3181 Bipolar II disorder: Secondary | ICD-10-CM | POA: Diagnosis not present

## 2023-09-16 NOTE — Progress Notes (Unsigned)
GYNECOLOGY  VISIT  CC:   discuss ultrasound  HPI: 37 y.o. G22P0020 Divorced White or Caucasian female here for discussion of ultrasound and to discuss treatment options.  Pt has hx of endometriosis that was diagnosis with surgical treatment (right salpingectomy) with ectopic pregnancy in 12/2021.  Reviewed all images with pt and significant other.  There are several locations of endometriosis that were not treated with the surgery.  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  She is desirous of pregnancy.   We discussed difficulty of conceiving with hydrosalpinx and additional risks of ectopic pregnancy.  Pt does have IVF coverage with her insurance.  Improved success with IVF with removal of any abnormal tubal tissue.  She is not sure if she is ready to proceed with this.  Really would like to know if tube is functioning.  HSG discussed for evaluation.  She is desirous of having this done.  We did review ultrasound findings from today as well.  Her ultrasound was normal except for a 2cm cyst on the right ovary.  There was one present with her laparoscopy last year.  I am unsure if this is new or something that has been present all of this time.  It appears simple and does have benign appearance today.    Pt was tearful today.  Looking at pictures of ectopic pregnancy was very sad for her as well as discussing the appearance of her other tube as well.     Past Medical History:  Diagnosis Date   Acute posttraumatic stress disorder    ADHD (attention deficit hyperactivity disorder), combined type    Allergy    Anal fistula    Arm fracture    right and left   Asthma    Bipolar 2 disorder (HCC)    Borderline personality disorder (HCC) 05/23/2022   Cannabis use disorder    Cocaine use disorder, moderate, in early remission (HCC)    Complication of anesthesia    "wakes up angry for about 5 minutes"    Depression    Endometriosis    Foot pain    right foot hurts since last night no reported injury per pt on 01-25-2022   GERD (gastroesophageal reflux disease)    Grief associated with loss of fetus    History of 2019 novel coronavirus disease (COVID-19) 04/29/2020   + Ag test - can see in Care everywhere   History of chicken pox    had 4 or 5 times as child   History of COVID-19 04/29/2020   mild all symptoms resolved   IBS (irritable bowel syndrome)    Irritable bowel    Leg fracture, left    Psychogenic nonepileptic seizure    last seizure 1 month ago per pt on 01-25-2022 saw dr Yvone Neu neurology   Seizure [facility])    last seizure 12-28-2021 per pt on 01-25-2022   Self-injurious behavior    Shingles    intermittent outbreaks   Ulcerative colitis (HCC)    Wears glasses     MEDS:   Current Outpatient Medications on File Prior to Visit  Medication Sig Dispense Refill   ARIPiprazole (ABILIFY) 2 MG tablet Take 1 tablet (2 mg total) by mouth daily. 30 tablet 3   busPIRone (BUSPAR) 5 MG tablet Take 1 tablet (5 mg total) by mouth daily at 12 noon. 60 tablet 3   cholestyramine (QUESTRAN) 4 GM/DOSE powder Take  2 g by mouth 3 (three) times daily with meals.     clonazePAM (KLONOPIN) 0.5 MG tablet Take 1 tablet (0.5 mg total) by mouth 3 (three) times daily. 30 tablet 0   dicyclomine (BENTYL) 10 MG capsule Take 10 mg by mouth daily as needed for spasms.     lamoTRIgine (LAMICTAL) 25 MG tablet Take 2 tablets (50 mg total) by mouth daily. 30 tablet 3   MELATONIN PO Take 1 tablet by mouth at bedtime.     ondansetron (ZOFRAN-ODT) 4 MG disintegrating tablet Take 1 tablet (4 mg total) by mouth every 8 (eight) hours as needed for nausea or vomiting. 30 tablet 5   pantoprazole (PROTONIX) 40 MG tablet TAKE 1 TABLET BY MOUTH TWICE DAILY BEFORE MEAL(S) 180 tablet 3   traZODone (DESYREL) 100 MG tablet Take 1 tablet (100 mg total) by mouth at bedtime as needed for sleep. 30 tablet 3   No current  facility-administered medications on file prior to visit.    ALLERGIES: Bee venom, Pork allergy, Tramadol, Cinnamon, Nsaids, and Lithium  SH:  partnered, non smoker  ROS  PHYSICAL EXAMINATION:    BP 115/70 (BP Location: Right Arm, Patient Position: Sitting, Cuff Size: Large)   Pulse (!) 56   Wt 281 lb (127.5 kg)   LMP 08/27/2023 (Exact Date) Comment: States she just spotted 3 days ago.  BMI 44.01 kg/m     Physical Exam Constitutional:      Appearance: Normal appearance.  Cardiovascular:     Rate and Rhythm: Normal rate and regular rhythm.  Pulmonary:     Effort: Pulmonary effort is normal.     Breath sounds: Normal breath sounds.  Neurological:     General: No focal deficit present.  Psychiatric:        Mood and Affect: Mood normal.        Behavior: Behavior normal.      Assessment/Plan: 1. Endometriosis - pt aware there is still active endometriosis present in her pelvis.  Treatment with progesterones, IUD, [place] or similar medication, depo lupron all reviewed.  She is not sure she wants to use any hormonal therapy.  2. Pelvic pain - Given active endometriosis, feel she needs to consider treatment option.   3. H/O unilateral salpingectomy - with treatment of ectopic  4. Hydrosalpinx - discussed evaluation of this fallopian tube.  If not patent or poorly patent, could consider salpingectomy with treatment of endometriosis at the same time.  She is not ready to make this decision.  Will plan to schedule HSG with next cycle if possible  5. Borderline personality disorder (HCC) - is receiving treatment and followed by behavioral health provider  Total time with pt 50 minutes Documentation time:  8 minutes

## 2023-09-17 LAB — CBC WITH DIFFERENTIAL/PLATELET
Basophils Absolute: 0.1 10*3/uL (ref 0.0–0.2)
Basos: 1 %
EOS (ABSOLUTE): 0.4 10*3/uL (ref 0.0–0.4)
Eos: 5 %
Hematocrit: 39.7 % (ref 34.0–46.6)
Hemoglobin: 12.6 g/dL (ref 11.1–15.9)
Immature Grans (Abs): 0.1 10*3/uL (ref 0.0–0.1)
Immature Granulocytes: 1 %
Lymphocytes Absolute: 3.3 10*3/uL — ABNORMAL HIGH (ref 0.7–3.1)
Lymphs: 40 %
MCH: 28.3 pg (ref 26.6–33.0)
MCHC: 31.7 g/dL (ref 31.5–35.7)
MCV: 89 fL (ref 79–97)
Monocytes Absolute: 0.6 10*3/uL (ref 0.1–0.9)
Monocytes: 8 %
Neutrophils Absolute: 3.8 10*3/uL (ref 1.4–7.0)
Neutrophils: 45 %
Platelets: 344 10*3/uL (ref 150–450)
RBC: 4.46 x10E6/uL (ref 3.77–5.28)
RDW: 12.9 % (ref 11.7–15.4)
WBC: 8.2 10*3/uL (ref 3.4–10.8)

## 2023-09-17 LAB — VITAMIN D, 25-HYDROXY, TOTAL: Vitamin D, 25-Hydroxy, Serum: 22 ng/mL — ABNORMAL LOW

## 2023-09-17 LAB — ANTI MULLERIAN HORMONE: ANTI-MULLERIAN HORMONE (AMH): 1.7 ng/mL

## 2023-09-17 LAB — BETA HCG QUANT (REF LAB): hCG Quant: <1 m[IU]/mL

## 2023-09-19 ENCOUNTER — Other Ambulatory Visit (HOSPITAL_BASED_OUTPATIENT_CLINIC_OR_DEPARTMENT_OTHER): Payer: Self-pay | Admitting: Certified Nurse Midwife

## 2023-09-19 MED ORDER — CHOLECALCIFEROL 1.25 MG (50000 UT) PO TABS: 1.0000 | ORAL_TABLET | ORAL | 11 refills | Status: DC

## 2023-09-20 ENCOUNTER — Encounter (HOSPITAL_BASED_OUTPATIENT_CLINIC_OR_DEPARTMENT_OTHER): Payer: Self-pay | Admitting: Family Medicine

## 2023-09-20 ENCOUNTER — Telehealth (HOSPITAL_BASED_OUTPATIENT_CLINIC_OR_DEPARTMENT_OTHER): Payer: Self-pay | Admitting: *Deleted

## 2023-09-20 ENCOUNTER — Other Ambulatory Visit (HOSPITAL_BASED_OUTPATIENT_CLINIC_OR_DEPARTMENT_OTHER): Payer: Self-pay | Admitting: Certified Nurse Midwife

## 2023-09-20 ENCOUNTER — Ambulatory Visit (INDEPENDENT_AMBULATORY_CARE_PROVIDER_SITE_OTHER): Payer: No Typology Code available for payment source | Admitting: Family Medicine

## 2023-09-20 ENCOUNTER — Telehealth (HOSPITAL_BASED_OUTPATIENT_CLINIC_OR_DEPARTMENT_OTHER): Payer: Self-pay | Admitting: Family Medicine

## 2023-09-20 VITALS — BP 124/83 | HR 84 | Ht 67.0 in | Wt 277.0 lb

## 2023-09-20 DIAGNOSIS — Z0000 Encounter for general adult medical examination without abnormal findings: Secondary | ICD-10-CM | POA: Diagnosis not present

## 2023-09-20 DIAGNOSIS — Z808 Family history of malignant neoplasm of other organs or systems: Secondary | ICD-10-CM | POA: Diagnosis not present

## 2023-09-20 DIAGNOSIS — R109 Unspecified abdominal pain: Secondary | ICD-10-CM | POA: Diagnosis not present

## 2023-09-20 DIAGNOSIS — F410 Panic disorder [episodic paroxysmal anxiety] without agoraphobia: Secondary | ICD-10-CM | POA: Diagnosis not present

## 2023-09-20 DIAGNOSIS — Z5181 Encounter for therapeutic drug level monitoring: Secondary | ICD-10-CM | POA: Diagnosis not present

## 2023-09-20 DIAGNOSIS — F4312 Post-traumatic stress disorder, chronic: Secondary | ICD-10-CM | POA: Diagnosis not present

## 2023-09-20 DIAGNOSIS — E282 Polycystic ovarian syndrome: Secondary | ICD-10-CM

## 2023-09-20 DIAGNOSIS — F3181 Bipolar II disorder: Secondary | ICD-10-CM | POA: Diagnosis not present

## 2023-09-20 MED ORDER — CHOLESTYRAMINE 4 GM/DOSE PO POWD: 2.0000 g | Freq: Two times a day (BID) | ORAL | 0 refills | Status: DC

## 2023-09-20 MED ORDER — CHOLESTYRAMINE LIGHT 4 G PO PACK: 2.0000 g | PACK | Freq: Two times a day (BID) | ORAL | 0 refills | Status: DC

## 2023-09-20 MED ORDER — VITAMIN D (ERGOCALCIFEROL) 1.25 MG (50000 UNIT) PO CAPS: 50000.0000 [IU] | ORAL_CAPSULE | ORAL | 3 refills | Status: DC

## 2023-09-20 MED ORDER — ALBUTEROL SULFATE HFA 108 (90 BASE) MCG/ACT IN AERS: 2.0000 | INHALATION_SPRAY | Freq: Four times a day (QID) | RESPIRATORY_TRACT | 2 refills | Status: DC | PRN

## 2023-09-20 NOTE — Telephone Encounter (Signed)
[facility] called and would like for someone to please give them a call about [phone])

## 2023-09-20 NOTE — Progress Notes (Unsigned)
Complete physical exam  Patient: Barbara Orozco   DOB: Sep 14, 1986   37 y.o. Female  MRN: [id number]  Subjective:   Barbara Orozco is a 37 y.o. female who presents today for a complete physical exam. She reports consuming a general diet, has improved diet. Exercise is limited by lower back pain. She generally feels well. She reports sleeping well. She does not have additional problems to discuss today.   Patient reports she has been experiencing constant LUQ & LLQ abdominal pain, with an increase in pain when she has bowel movements.   Dentist- recently  Eye- last year   Depression screenings:    09/11/2023    4:06 PM 09/06/2023    8:12 AM 08/16/2023    8:48 AM  Depression screen PHQ 2/9  Decreased Interest 0 0 2  Down, Depressed, Hopeless 0 0 2  PHQ - 2 Score 0 0 4  Altered sleeping  0 3  Tired, decreased energy  0 3  Change in appetite  0 2  Feeling bad or failure about yourself   0 1  Trouble concentrating  0 3  Moving slowly or fidgety/restless  0 3  Suicidal thoughts  0 1  PHQ-9 Score  0 20  Difficult doing work/chores   Very difficult    Anxiety screenings:    08/16/2023    8:48 AM  GAD 7 : Generalized Anxiety Score  Nervous, Anxious, on Edge 2  Control/stop worrying 1  Worry too much - different things 1  Trouble relaxing 2  Restless 3  Easily annoyed or irritable 3  Afraid - awful might happen 2  Total GAD 7 Score 14  Anxiety Difficulty Very difficult    Patient Care Team: Alyson Reedy, FNP as PCP - General (Family Medicine) Van Clines, MD as Consulting Physician (Neurology)   Outpatient Medications Prior to Visit  Medication Sig   busPIRone (BUSPAR) 5 MG tablet Take 1 tablet (5 mg total) by mouth daily at 12 noon.   clonazePAM (KLONOPIN) 0.5 MG tablet Take 1 tablet (0.5 mg total) by mouth 3 (three) times daily.   desvenlafaxine (PRISTIQ) 25 MG 24 hr tablet    desvenlafaxine (PRISTIQ) 50 MG 24 hr tablet    dicyclomine (BENTYL) 10 MG capsule  Take 10 mg by mouth daily as needed for spasms.   lamoTRIgine (LAMICTAL) 25 MG tablet Take 2 tablets (50 mg total) by mouth daily.   MELATONIN PO Take 1 tablet by mouth at bedtime.   ondansetron (ZOFRAN-ODT) 4 MG disintegrating tablet Take 1 tablet (4 mg total) by mouth every 8 (eight) hours as needed for nausea or vomiting.   pantoprazole (PROTONIX) 40 MG tablet TAKE 1 TABLET BY MOUTH TWICE DAILY BEFORE MEAL(S)   traZODone (DESYREL) 100 MG tablet Take 1 tablet (100 mg total) by mouth at bedtime as needed for sleep.   [DISCONTINUED] Cholecalciferol 1.25 MG (50000 UT) TABS Take 1 tablet by mouth once a week.   [DISCONTINUED] cholestyramine (QUESTRAN) 4 GM/DOSE powder Take 2 g by mouth 3 (three) times daily with meals.   [DISCONTINUED] ARIPiprazole (ABILIFY) 2 MG tablet Take 1 tablet (2 mg total) by mouth daily. (Patient not taking: Reported on 09/20/2023)   No facility-administered medications prior to visit.    Review of Systems  Constitutional:  Negative for malaise/fatigue and weight loss.  HENT:  Negative for ear pain and tinnitus.   Eyes:  Negative for blurred vision and double vision.  Respiratory:  Negative for cough and shortness of  breath.   Cardiovascular:  Negative for chest pain, palpitations and leg swelling.  Gastrointestinal:  Positive for abdominal pain. Negative for nausea and vomiting.  Musculoskeletal:  Negative for myalgias.  Neurological:  Negative for dizziness, weakness and headaches.  Psychiatric/Behavioral:  Negative for depression, substance abuse and suicidal ideas. The patient is not nervous/anxious and does not have insomnia.        Objective:     BP 124/83   Pulse 84   Ht 5\' 7"  (1.702 m)   Wt 277 lb (125.6 kg)   LMP 08/27/2023 (Exact Date) Comment: States she just spotted 3 days ago.  SpO2 97%   BMI 43.38 kg/m  BP Readings from Last 3 Encounters:  09/20/23 124/83  09/11/23 115/70  09/06/23 115/77     Physical Exam Constitutional:       Appearance: Normal appearance.  HENT:     Head: Normocephalic.     Right Ear: Tympanic membrane, ear canal and external ear normal.     Left Ear: Tympanic membrane, ear canal and external ear normal.     Nose: Nose normal.     Mouth/Throat:     Mouth: Mucous membranes are moist.     Pharynx: Oropharynx is clear.  Eyes:     Extraocular Movements: Extraocular movements intact.     Pupils: Pupils are equal, round, and reactive to light.  Cardiovascular:     Rate and Rhythm: Normal rate and regular rhythm.     Pulses: Normal pulses.     Heart sounds: Normal heart sounds.  Pulmonary:     Effort: Pulmonary effort is normal.     Breath sounds: Normal breath sounds.  Abdominal:     General: Abdomen is flat. Bowel sounds are normal.     Palpations: Abdomen is soft.     Tenderness: There is abdominal tenderness in the left upper quadrant and left lower quadrant.  Musculoskeletal:        General: Normal range of motion.     Cervical back: Normal range of motion.  Skin:    General: Skin is warm and dry.  Neurological:     Mental Status: She is alert.  Psychiatric:        Mood and Affect: Mood normal.        Behavior: Behavior normal.        Thought Content: Thought content normal.        Judgment: Judgment normal.       Assessment & Plan:    Routine Health Maintenance and Physical Exam  Health Maintenance  Topic Date Due   COVID-19 Vaccine (1 - 2023-24 season) 10/06/2023*   DTaP/Tdap/Td vaccine (1 - Tdap) 11/06/2023*   Flu Shot  02/03/2024*   HIV Screening  09/19/2024*   Pap with HPV screening  09/05/2028   Hepatitis C Screening  Completed   HPV Vaccine  Aged Out  *Topic was postponed. The date shown is not the original due date.    1. Wellness examination Routine HCM labs ordered/Labs reviewed/discussed today. Will obtain labs today and update patient with results.  Review of PMH, FH, SH, medications and HM performed. Preventative care hand-out provided.  Recommend  healthy diet.  Recommend approximately 150 minutes/week of moderate intensity exercise. Recommend regular dental and vision exams. Always use seatbelt/lap and shoulder restraints. Recommend using smoke alarms and checking batteries at least twice a year. Recommend using sunscreen when outside. Discussed immunization recommendations for tetanus vaccine. Patient declines at this time, will consider. Vaccines are  UTD.   2. Encounter for therapeutic drug level monitoring Patient currently on Lamictal- will assess lamotrigine level.  - Lamotrigine level  3. PCOS (polycystic ovarian syndrome) Patient would like hormone levels tested- will check basic levels today.  - Estradiol - DHEA  4. Family history of malignant neoplasm of pituitary gland and craniopharyngeal duct Patient reports family history of pituitary tumor in her mother and maternal grandmother- she would like her prolactin level assessed.  - Prolactin  5. Left sided abdominal pain Patient reports chronic abdominal pain in her LUQ and LLQ. She is wondering about pain relief, but is allergic to NSAIDs and tramadol. She reports minimal pain relief with tylenol. Advised patient will look into what may benefit her abdominal pain and reach out to her with options.    Return in about 1 year (around 09/19/2024) for Physical with fasting labs.     Alyson Reedy, FNP

## 2023-09-20 NOTE — Patient Instructions (Signed)

## 2023-09-23 ENCOUNTER — Encounter (HOSPITAL_BASED_OUTPATIENT_CLINIC_OR_DEPARTMENT_OTHER): Payer: Self-pay | Admitting: Family Medicine

## 2023-09-24 DIAGNOSIS — F3181 Bipolar II disorder: Secondary | ICD-10-CM | POA: Diagnosis not present

## 2023-09-24 NOTE — Telephone Encounter (Signed)
error 

## 2023-09-26 ENCOUNTER — Encounter (HOSPITAL_BASED_OUTPATIENT_CLINIC_OR_DEPARTMENT_OTHER): Payer: Self-pay | Admitting: Family Medicine

## 2023-09-27 LAB — PROLACTIN: Prolactin: 16.1 ng/mL (ref 4.8–33.4)

## 2023-09-27 LAB — DHEA: Dehydroepiandrosterone (DHEA): 90 ng/dL (ref 31–701)

## 2023-09-27 LAB — LAMOTRIGINE LEVEL: Lamotrigine Lvl: 1.6 ug/mL — ABNORMAL LOW (ref 2.0–20.0)

## 2023-09-27 LAB — ESTRADIOL: Estradiol: 41 pg/mL

## 2023-09-27 NOTE — Telephone Encounter (Signed)
Called pharmacy. They stated they did not have any questions or concerns regarding this patients medication.

## 2023-10-02 DIAGNOSIS — F3181 Bipolar II disorder: Secondary | ICD-10-CM | POA: Diagnosis not present

## 2023-10-04 DIAGNOSIS — F902 Attention-deficit hyperactivity disorder, combined type: Secondary | ICD-10-CM | POA: Diagnosis not present

## 2023-10-04 DIAGNOSIS — F3181 Bipolar II disorder: Secondary | ICD-10-CM | POA: Diagnosis not present

## 2023-10-04 DIAGNOSIS — F410 Panic disorder [episodic paroxysmal anxiety] without agoraphobia: Secondary | ICD-10-CM | POA: Diagnosis not present

## 2023-10-04 DIAGNOSIS — F4312 Post-traumatic stress disorder, chronic: Secondary | ICD-10-CM | POA: Diagnosis not present

## 2023-11-01 DIAGNOSIS — F902 Attention-deficit hyperactivity disorder, combined type: Secondary | ICD-10-CM | POA: Diagnosis not present

## 2023-11-01 DIAGNOSIS — F4312 Post-traumatic stress disorder, chronic: Secondary | ICD-10-CM | POA: Diagnosis not present

## 2023-11-01 DIAGNOSIS — F3181 Bipolar II disorder: Secondary | ICD-10-CM | POA: Diagnosis not present

## 2023-11-01 DIAGNOSIS — F410 Panic disorder [episodic paroxysmal anxiety] without agoraphobia: Secondary | ICD-10-CM | POA: Diagnosis not present

## 2023-11-05 DIAGNOSIS — F410 Panic disorder [episodic paroxysmal anxiety] without agoraphobia: Secondary | ICD-10-CM | POA: Diagnosis not present

## 2023-11-05 DIAGNOSIS — F5105 Insomnia due to other mental disorder: Secondary | ICD-10-CM | POA: Diagnosis not present

## 2023-11-05 DIAGNOSIS — F902 Attention-deficit hyperactivity disorder, combined type: Secondary | ICD-10-CM | POA: Diagnosis not present

## 2023-11-05 DIAGNOSIS — F3181 Bipolar II disorder: Secondary | ICD-10-CM | POA: Diagnosis not present

## 2023-11-05 DIAGNOSIS — F4312 Post-traumatic stress disorder, chronic: Secondary | ICD-10-CM | POA: Diagnosis not present

## 2023-11-05 DIAGNOSIS — F411 Generalized anxiety disorder: Secondary | ICD-10-CM | POA: Diagnosis not present

## 2023-11-10 ENCOUNTER — Ambulatory Visit (HOSPITAL_COMMUNITY)
Admission: EM | Admit: 2023-11-10 | Discharge: 2023-11-11 | Disposition: A | Discharge status: Other Acute Inpatient Hospital | Payer: No Typology Code available for payment source | Attending: Nurse Practitioner | Admitting: Nurse Practitioner

## 2023-11-10 DIAGNOSIS — Z91410 Personal history of adult physical and sexual abuse: Secondary | ICD-10-CM | POA: Diagnosis not present

## 2023-11-10 DIAGNOSIS — R45851 Suicidal ideations: Secondary | ICD-10-CM

## 2023-11-10 DIAGNOSIS — X58XXXA Exposure to other specified factors, initial encounter: Secondary | ICD-10-CM | POA: Insufficient documentation

## 2023-11-10 DIAGNOSIS — F3181 Bipolar II disorder: Secondary | ICD-10-CM | POA: Diagnosis not present

## 2023-11-10 DIAGNOSIS — X780XXA Intentional self-harm by sharp glass, initial encounter: Secondary | ICD-10-CM | POA: Insufficient documentation

## 2023-11-10 DIAGNOSIS — F603 Borderline personality disorder: Secondary | ICD-10-CM | POA: Diagnosis not present

## 2023-11-10 MED ORDER — TRAZODONE HCL 50 MG PO TABS
50.0000 mg | ORAL_TABLET | Freq: Every evening | ORAL | Status: DC | PRN
  Administered 2023-11-10: 50 mg via ORAL
  Filled 2023-11-10: qty 1

## 2023-11-10 MED ORDER — MAGNESIUM HYDROXIDE 400 MG/5ML PO SUSP: 30.0000 mL | Freq: Every day | ORAL | Status: DC | PRN

## 2023-11-10 MED ORDER — ACETAMINOPHEN 325 MG PO TABS
650.0000 mg | ORAL_TABLET | Freq: Four times a day (QID) | ORAL | Status: DC | PRN
  Administered 2023-11-11: 650 mg via ORAL
  Filled 2023-11-10: qty 2

## 2023-11-10 MED ORDER — ALUM & MAG HYDROXIDE-SIMETH 200-200-20 MG/5ML PO SUSP: 30.0000 mL | ORAL | Status: DC | PRN

## 2023-11-10 MED ORDER — HYDROXYZINE HCL 25 MG PO TABS
25.0000 mg | ORAL_TABLET | Freq: Three times a day (TID) | ORAL | Status: DC | PRN
  Administered 2023-11-10: 25 mg via ORAL
  Filled 2023-11-10: qty 1

## 2023-11-10 MED ORDER — TRAZODONE HCL 150 MG PO TABS
150.0000 mg | ORAL_TABLET | Freq: Once | ORAL | Status: AC
Start: 2023-11-11 — End: 2023-11-10
  Administered 2023-11-10: 150 mg via ORAL
  Filled 2023-11-10: qty 1
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
## 2023-11-10 NOTE — ED Notes (Signed)
 Patient A&Ox4. Patient reports SI with multiple plans. Patient denies HI and AVH. Patient oriented to unit and meal given. Patient denies any physical complaints when asked. No acute distress noted. Support and encouragement provided. Routine safety checks conducted according to facility protocol. Encouraged patient to notify staff if thoughts of harm toward self or others arise. Patient verbalize understanding and agreement. Will continue to monitor for safety.

## 2023-11-10 NOTE — ED Provider Notes (Addendum)
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 concentration for many patients. However, some  are best treated at concentrations outside of this range. Acetaminophen  concentrations >150 ug/mL at 4 hours after ingestion  and >50 ug/mL at 12 hours after ingestion are often associated with  toxic reactions.  Performed at [facility], [address] [place], [place] [address]    WBC 07/12/2023 10.7 (H)  4.0 - 10.5 K/uL Final   RBC 07/12/2023 4.91  3.87 - 5.11 MIL/uL Final   Hemoglobin 07/12/2023 14.0  12.0 - 15.0 g/dL Final   HCT 90/93/7975 42.0  36.0 - 46.0 % Final   MCV 07/12/2023 85.5  80.0 - 100.0 fL Final   MCH 07/12/2023 28.5  26.0 - 34.0 pg Final   MCHC 07/12/2023 33.3  30.0 - 36.0 g/dL Final   RDW 90/93/7975 13.4  11.5 - 15.5 % Final   Platelets 07/12/2023 403 (H)  150 - 400 K/uL Final   nRBC 07/12/2023 0.0  0.0 - 0.2 % Final   Performed at [facility], [address] [place], [place] [address]   Tricyclic, Ur Screen 07/12/2023 NONE DETECTED  NONE DETECTED Final   Amphetamines, Ur Screen 07/12/2023 NONE DETECTED  NONE DETECTED Final   MDMA (Ecstasy)Ur Screen 07/12/2023 NONE DETECTED  NONE DETECTED Final   Cocaine Metabolite,Ur [place] 07/12/2023 NONE DETECTED  NONE DETECTED Final   Opiate, Ur Screen 07/12/2023 NONE DETECTED  NONE DETECTED Final   Phencyclidine (PCP) Ur S 07/12/2023 NONE DETECTED  NONE DETECTED Final   Cannabinoid 50 Ng, Ur [place] 07/12/2023 POSITIVE (A)  NONE DETECTED Final   Barbiturates, Ur Screen 07/12/2023 NONE DETECTED  NONE DETECTED Final   Benzodiazepine, Ur Scrn 07/12/2023 NONE DETECTED  NONE DETECTED Final   Methadone Scn, Ur 07/12/2023 NONE DETECTED  NONE DETECTED Final   Comment: (NOTE) Tricyclics + metabolites, urine    Cutoff 1000  ng/mL Amphetamines + metabolites, urine  Cutoff 1000 ng/mL MDMA (Ecstasy), urine              Cutoff 500 ng/mL Cocaine Metabolite, urine          Cutoff 300 ng/mL Opiate + metabolites, urine        Cutoff 300 ng/mL Phencyclidine (PCP), urine         Cutoff 25 ng/mL Cannabinoid, urine                 Cutoff 50 ng/mL Barbiturates + metabolites, urine  Cutoff 200 ng/mL Benzodiazepine, urine              Cutoff 200 ng/mL Methadone, urine                   Cutoff 300 ng/mL  The urine drug screen provides only a preliminary, unconfirmed analytical test result and should not be used for non-medical purposes. Clinical consideration and professional judgment should be applied to any positive drug screen result due to possible interfering substances. A more specific alternate chemical method must be used in order to obtain a confirmed analytical result. Gas chromatography / mass spectrometry (GC/MS) is the preferred confirm                          atory method. Performed at [facility], [address] [place], [place] [address]    Preg Test, Ur 07/12/2023  NEGATIVE  NEGATIVE Final   Comment:        THE SENSITIVITY OF THIS METHODOLOGY IS >24 mIU/mL    HIV-1 P24 Antigen - HIV24 07/12/2023 NON REACTIVE  NON REACTIVE Final   Comment: (NOTE) Detection of p24 may be inhibited by biotin in the sample, causing false negative results in acute infection.    HIV 1/2 Antibodies 07/12/2023 NON REACTIVE  NON REACTIVE Final   Interpretation (HIV Ag Ab) 07/12/2023 A non reactive test result means that HIV 1 or HIV 2 antibodies and HIV 1 p24 antigen were not detected in the specimen.   Final   Performed at [facility], [address] [place], [place] [address]   HCV Ab 07/12/2023 NON REACTIVE  NON REACTIVE Final   Comment: (NOTE) Nonreactive HCV antibody screen is consistent with no HCV infections,  unless recent infection is suspected or other evidence exists to indicate HCV  infection.  Performed at [facility], [address], [place], [place] [address]    Hepatitis B Surface Ag 07/12/2023 NON REACTIVE  NON REACTIVE Final   Performed at [facility], [address], [place], [place] [address]   RPR Ser Ql 07/12/2023 NON REACTIVE  NON REACTIVE Final   Performed at [facility], [address] [place], [place] [address]    Allergies: Bee venom, Pork allergy, Tramadol, Cinnamon, Nsaids, and Lithium  Medications:  Facility Ordered Medications  Medication   acetaminophen  (TYLENOL ) tablet 650 mg   alum & mag hydroxide-simeth (MAALOX/MYLANTA) 200-200-20 MG/5ML suspension 30 mL   magnesium  hydroxide (MILK OF MAGNESIA) suspension 30 mL   hydrOXYzine  (ATARAX ) tablet 25 mg   [COMPLETED] traZODone  (DESYREL ) tablet 150 mg   PTA Medications  Medication Sig   ondansetron  (ZOFRAN -ODT) 4 MG disintegrating tablet Take 1 tablet (4 mg total) by mouth every 8 (eight) hours as needed for nausea or vomiting.   pantoprazole  (PROTONIX ) 40 MG tablet TAKE 1 TABLET BY MOUTH TWICE DAILY BEFORE MEAL(S)   MELATONIN PO Take 1 tablet by mouth at bedtime.   dicyclomine  (BENTYL ) 10 MG capsule Take 10 mg by mouth daily as needed for spasms.   busPIRone  (BUSPAR ) 5 MG tablet Take 1 tablet (5 mg total) by mouth daily at 12 noon.   traZODone  (DESYREL ) 100 MG tablet Take 1 tablet (100 mg total) by mouth at bedtime as needed for sleep.   clonazePAM  (KLONOPIN ) 0.5 MG tablet Take 1 tablet (0.5 mg total) by mouth 3 (three) times daily.   lamoTRIgine  (LAMICTAL ) 25 MG tablet Take 2 tablets (50 mg total) by mouth daily.   DIALYVITE VITAMIN D3 MAX 1.25 MG (50000 UT) TABS TAKE 1 TABLET BY MOUTH ONCE A WEEK   desvenlafaxine  (PRISTIQ ) 50 MG 24 hr tablet    desvenlafaxine  (PRISTIQ ) 25 MG 24 hr tablet    albuterol  (VENTOLIN  HFA) 108 (90 Base) MCG/ACT inhaler Inhale 2 puffs into the lungs every 6 (six) hours as needed for wheezing or shortness of breath.   cholestyramine  light  (PREVALITE ) 4 g packet Take 0.5 packets (2 g total) by mouth 2 (two) times daily.   Vitamin D , Ergocalciferol , (DRISDOL ) 1.25 MG (50000 UNIT) CAPS capsule Take 1 capsule (50,000 Units total) by mouth every 7 (seven) days.      Medical Decision Making  Barbara Orozco is a 38 y/o female with history of Bipolar Disorder, Suicidal ideation, ADHD presenting to [facility] for worsening depression and suicidal ideation with a plan to either overdose on  her medications, walk into traffic or jump in front of a train.    Recommendations  Based on my evaluation the patient does not appear to have an emergency medical condition.  Patient recommended for inpatient treatment and will be admitted to [facility] continuous observation until an inpatient bed can be identified.  Barbara Rumble E Zadaya Cuadra, NP 11/11/23  12:27 AM
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
## 2023-11-11 ENCOUNTER — Inpatient Hospital Stay (HOSPITAL_COMMUNITY)
Admission: AD | Admit: 2023-11-11 | Discharge: 2023-11-14 | DRG: 885 | Disposition: A | Discharge status: Home / Self Care | Payer: No Typology Code available for payment source | Source: Intra-hospital | Attending: Psychiatry | Admitting: Psychiatry

## 2023-11-11 ENCOUNTER — Other Ambulatory Visit: Payer: Self-pay

## 2023-11-11 ENCOUNTER — Encounter (HOSPITAL_COMMUNITY): Payer: Self-pay | Admitting: Nurse Practitioner

## 2023-11-11 DIAGNOSIS — J45909 Unspecified asthma, uncomplicated: Secondary | ICD-10-CM | POA: Diagnosis present

## 2023-11-11 DIAGNOSIS — Z800 Family history of malignant neoplasm of digestive organs: Secondary | ICD-10-CM | POA: Diagnosis not present

## 2023-11-11 DIAGNOSIS — Z886 Allergy status to analgesic agent status: Secondary | ICD-10-CM

## 2023-11-11 DIAGNOSIS — Z79899 Other long term (current) drug therapy: Secondary | ICD-10-CM | POA: Diagnosis not present

## 2023-11-11 DIAGNOSIS — Z8249 Family history of ischemic heart disease and other diseases of the circulatory system: Secondary | ICD-10-CM | POA: Diagnosis not present

## 2023-11-11 DIAGNOSIS — F3181 Bipolar II disorder: Secondary | ICD-10-CM | POA: Diagnosis present

## 2023-11-11 DIAGNOSIS — R45851 Suicidal ideations: Secondary | ICD-10-CM | POA: Diagnosis present

## 2023-11-11 DIAGNOSIS — Z8616 Personal history of COVID-19: Secondary | ICD-10-CM

## 2023-11-11 DIAGNOSIS — F314 Bipolar disorder, current episode depressed, severe, without psychotic features: Secondary | ICD-10-CM | POA: Diagnosis not present

## 2023-11-11 DIAGNOSIS — F445 Conversion disorder with seizures or convulsions: Secondary | ICD-10-CM | POA: Diagnosis present

## 2023-11-11 DIAGNOSIS — Z91030 Bee allergy status: Secondary | ICD-10-CM

## 2023-11-11 DIAGNOSIS — Z818 Family history of other mental and behavioral disorders: Secondary | ICD-10-CM | POA: Diagnosis not present

## 2023-11-11 DIAGNOSIS — F4310 Post-traumatic stress disorder, unspecified: Secondary | ICD-10-CM | POA: Diagnosis present

## 2023-11-11 DIAGNOSIS — N809 Endometriosis, unspecified: Secondary | ICD-10-CM | POA: Diagnosis present

## 2023-11-11 DIAGNOSIS — K219 Gastro-esophageal reflux disease without esophagitis: Secondary | ICD-10-CM | POA: Diagnosis present

## 2023-11-11 DIAGNOSIS — F4321 Adjustment disorder with depressed mood: Secondary | ICD-10-CM | POA: Diagnosis present

## 2023-11-11 DIAGNOSIS — Z888 Allergy status to other drugs, medicaments and biological substances status: Secondary | ICD-10-CM

## 2023-11-11 DIAGNOSIS — G4700 Insomnia, unspecified: Secondary | ICD-10-CM | POA: Diagnosis present

## 2023-11-11 DIAGNOSIS — Z91014 Allergy to mammalian meats: Secondary | ICD-10-CM | POA: Diagnosis not present

## 2023-11-11 DIAGNOSIS — F603 Borderline personality disorder: Secondary | ICD-10-CM | POA: Diagnosis present

## 2023-11-11 DIAGNOSIS — Z833 Family history of diabetes mellitus: Secondary | ICD-10-CM

## 2023-11-11 DIAGNOSIS — F3281 Premenstrual dysphoric disorder: Secondary | ICD-10-CM | POA: Insufficient documentation

## 2023-11-11 DIAGNOSIS — F319 Bipolar disorder, unspecified: Principal | ICD-10-CM

## 2023-11-11 DIAGNOSIS — F1290 Cannabis use, unspecified, uncomplicated: Secondary | ICD-10-CM | POA: Diagnosis present

## 2023-11-11 LAB — POCT URINE DRUG SCREEN - MANUAL ENTRY (I-SCREEN)
POC Amphetamine UR: NOT DETECTED
POC Buprenorphine (BUP): NOT DETECTED
POC Cocaine UR: NOT DETECTED
POC Marijuana UR: POSITIVE — AB
POC Methadone UR: NOT DETECTED
POC Methamphetamine UR: NOT DETECTED
POC Morphine: NOT DETECTED
POC Oxazepam (BZO): NOT DETECTED
POC Oxycodone UR: NOT DETECTED
POC Secobarbital (BAR): NOT DETECTED

## 2023-11-11 LAB — CBC WITH DIFFERENTIAL/PLATELET
Abs Immature Granulocytes: 0.02 10*3/uL (ref 0.00–0.07)
Basophils Absolute: 0.1 10*3/uL (ref 0.0–0.1)
Basophils Relative: 1 %
Eosinophils Absolute: 0.4 10*3/uL (ref 0.0–0.5)
Eosinophils Relative: 4 %
HCT: 38.9 % (ref 36.0–46.0)
Hemoglobin: 12.7 g/dL (ref 12.0–15.0)
Immature Granulocytes: 0 %
Lymphocytes Relative: 44 %
Lymphs Abs: 4.6 10*3/uL — ABNORMAL HIGH (ref 0.7–4.0)
MCH: 28.5 pg (ref 26.0–34.0)
MCHC: 32.6 g/dL (ref 30.0–36.0)
MCV: 87.4 fL (ref 80.0–100.0)
Monocytes Absolute: 0.6 10*3/uL (ref 0.1–1.0)
Monocytes Relative: 5 %
Neutro Abs: 4.9 10*3/uL (ref 1.7–7.7)
Neutrophils Relative %: 46 %
Platelets: 411 10*3/uL — ABNORMAL HIGH (ref 150–400)
RBC: 4.45 MIL/uL (ref 3.87–5.11)
RDW: 14 % (ref 11.5–15.5)
WBC: 10.5 10*3/uL (ref 4.0–10.5)
nRBC: 0 % (ref 0.0–0.2)

## 2023-11-11 LAB — LIPID PANEL
Cholesterol: 242 mg/dL — ABNORMAL HIGH (ref 0–200)
HDL: 42 mg/dL (ref >40)
LDL Cholesterol: 151 mg/dL — ABNORMAL HIGH (ref 0–99)
Total CHOL/HDL Ratio: 5.8 {ratio}
Triglycerides: 247 mg/dL — ABNORMAL HIGH (ref <150)
VLDL: 49 mg/dL — ABNORMAL HIGH (ref 0–40)

## 2023-11-11 LAB — ETHANOL: Alcohol, Ethyl (B): <10 mg/dL (ref <10)

## 2023-11-11 LAB — COMPREHENSIVE METABOLIC PANEL
ALT: 15 U/L (ref 0–44)
AST: 17 U/L (ref 15–41)
Albumin: 3.4 g/dL — ABNORMAL LOW (ref 3.5–5.0)
Alkaline Phosphatase: 79 U/L (ref 38–126)
Anion gap: 11 (ref 5–15)
BUN: 11 mg/dL (ref 6–20)
CO2: 22 mmol/L (ref 22–32)
Calcium: 9.1 mg/dL (ref 8.9–10.3)
Chloride: 104 mmol/L (ref 98–111)
Creatinine, Ser: 0.73 mg/dL (ref 0.44–1.00)
GFR, Estimated: >60 mL/min (ref >60)
Glucose, Bld: 93 mg/dL (ref 70–99)
Potassium: 4.1 mmol/L (ref 3.5–5.1)
Sodium: 137 mmol/L (ref 135–145)
Total Bilirubin: 0.3 mg/dL (ref 0.0–1.2)
Total Protein: 6.2 g/dL — ABNORMAL LOW (ref 6.5–8.1)

## 2023-11-11 LAB — POC URINE PREG, ED: Preg Test, Ur: NEGATIVE

## 2023-11-11 MED ORDER — VENLAFAXINE HCL ER 150 MG PO CP24
150.0000 mg | ORAL_CAPSULE | Freq: Every day | ORAL | Status: DC
  Filled 2023-11-11 (×2): qty 1

## 2023-11-11 MED ORDER — CHOLESTYRAMINE LIGHT 4 G PO PACK
2.0000 g | PACK | Freq: Two times a day (BID) | ORAL | Status: DC
  Administered 2023-11-11: 2 g via ORAL
  Filled 2023-11-11 (×2): qty 1

## 2023-11-11 MED ORDER — LAMOTRIGINE 100 MG PO TABS
100.0000 mg | ORAL_TABLET | Freq: Every day | ORAL | Status: DC
  Administered 2023-11-11: 100 mg via ORAL
  Filled 2023-11-11: qty 1

## 2023-11-11 MED ORDER — LORAZEPAM 2 MG/ML IJ SOLN: 2.0000 mg | Freq: Three times a day (TID) | INTRAMUSCULAR | Status: DC | PRN

## 2023-11-11 MED ORDER — HALOPERIDOL LACTATE 5 MG/ML IJ SOLN: 5.0000 mg | Freq: Three times a day (TID) | INTRAMUSCULAR | Status: DC | PRN

## 2023-11-11 MED ORDER — TRAZODONE HCL 100 MG PO TABS
100.0000 mg | ORAL_TABLET | Freq: Every evening | ORAL | Status: DC | PRN
  Administered 2023-11-11: 100 mg via ORAL
  Filled 2023-11-11: qty 1

## 2023-11-11 MED ORDER — DIPHENHYDRAMINE HCL 50 MG/ML IJ SOLN: 50.0000 mg | Freq: Three times a day (TID) | INTRAMUSCULAR | Status: DC | PRN

## 2023-11-11 MED ORDER — CLONAZEPAM 0.5 MG PO TABS
0.5000 mg | ORAL_TABLET | Freq: Three times a day (TID) | ORAL | Status: DC
Start: 2023-11-11 — End: 2023-11-11
  Administered 2023-11-11: 0.5 mg via ORAL
  Filled 2023-11-11: qty 1

## 2023-11-11 MED ORDER — CHOLESTYRAMINE LIGHT 4 G PO PACK
2.0000 g | PACK | Freq: Two times a day (BID) | ORAL | Status: DC
Start: 2023-11-11 — End: 2023-11-14
  Administered 2023-11-11 – 2023-11-14 (×4): 2 g via ORAL
  Filled 2023-11-11 (×7): qty 1

## 2023-11-11 MED ORDER — PANTOPRAZOLE SODIUM 40 MG PO TBEC
40.0000 mg | DELAYED_RELEASE_TABLET | Freq: Every day | ORAL | Status: DC
  Administered 2023-11-11: 40 mg via ORAL
  Filled 2023-11-11: qty 1

## 2023-11-11 MED ORDER — HALOPERIDOL 5 MG PO TABS: 5.0000 mg | ORAL_TABLET | Freq: Three times a day (TID) | ORAL | Status: DC | PRN

## 2023-11-11 MED ORDER — DIPHENHYDRAMINE HCL 25 MG PO CAPS: 50.0000 mg | ORAL_CAPSULE | Freq: Three times a day (TID) | ORAL | Status: DC | PRN

## 2023-11-11 MED ORDER — CLONAZEPAM 0.5 MG PO TABS
0.5000 mg | ORAL_TABLET | Freq: Three times a day (TID) | ORAL | Status: DC
  Administered 2023-11-11 – 2023-11-14 (×9): 0.5 mg via ORAL
  Filled 2023-11-11 (×9): qty 1

## 2023-11-11 MED ORDER — PANTOPRAZOLE SODIUM 40 MG PO TBEC
40.0000 mg | DELAYED_RELEASE_TABLET | Freq: Every day | ORAL | Status: DC
  Administered 2023-11-12 – 2023-11-14 (×3): 40 mg via ORAL
  Filled 2023-11-11 (×4): qty 1

## 2023-11-11 MED ORDER — NON FORMULARY: 100.0000 mg | Freq: Every day | Status: DC

## 2023-11-11 MED ORDER — ACETAMINOPHEN 325 MG PO TABS
650.0000 mg | ORAL_TABLET | Freq: Four times a day (QID) | ORAL | Status: DC | PRN
  Administered 2023-11-11 – 2023-11-13 (×4): 650 mg via ORAL
  Filled 2023-11-11 (×4): qty 2

## 2023-11-11 MED ORDER — PANTOPRAZOLE SODIUM 40 MG PO TBEC: 40.0000 mg | DELAYED_RELEASE_TABLET | Freq: Every day | ORAL | Status: DC

## 2023-11-11 MED ORDER — HYDROXYZINE HCL 25 MG PO TABS
25.0000 mg | ORAL_TABLET | Freq: Three times a day (TID) | ORAL | Status: DC | PRN
  Administered 2023-11-11 – 2023-11-13 (×4): 25 mg via ORAL
  Filled 2023-11-11 (×4): qty 1

## 2023-11-11 MED ORDER — ALUM & MAG HYDROXIDE-SIMETH 200-200-20 MG/5ML PO SUSP: 30.0000 mL | ORAL | Status: DC | PRN

## 2023-11-11 MED ORDER — DESVENLAFAXINE SUCCINATE ER 50 MG PO TB24
100.0000 mg | ORAL_TABLET | Freq: Every day | ORAL | Status: DC
  Administered 2023-11-11: 100 mg via ORAL
  Filled 2023-11-11: qty 2

## 2023-11-11 MED ORDER — HALOPERIDOL LACTATE 5 MG/ML IJ SOLN: 10.0000 mg | Freq: Three times a day (TID) | INTRAMUSCULAR | Status: DC | PRN

## 2023-11-11 NOTE — ED Notes (Signed)
 Patient resting in lounger with eyes closed, respirations even and unlabored. Patient in no apparent acute distress. Environment secured. Safety checks in place per facility protocol.

## 2023-11-11 NOTE — ED Notes (Signed)
 Patient received breakfast.

## 2023-11-11 NOTE — Progress Notes (Signed)
 Patient moved to a private room. [id number]

## 2023-11-11 NOTE — Discharge Instructions (Addendum)
 Dear Barbara Orozco,  It was a pleasure to take care of you during your stay at [facility]) where you were treated for your suicidal ideation, bipolar disorder, .   While you were here, you were:  observed and cared for by our nurses and nursing assistants  treated with medications by your psychiatrists  Please review the medication list provided to you at discharge and stop, start taking, or continue taking the medications listed there.  You should also follow-up with your primary care doctor, or start seeing one if you don't have one yet. If applicable, here are some scheduled follow-ups for you:    I recommend abstinence from alcohol, tobacco, and other illicit drug use.   If your psychiatric symptoms or suicidal thoughts recur, worsen, or if you have side effects to your psychiatric medications, call your outpatient psychiatric provider, [phone] or go to the nearest emergency department.  Take care!  Signed: Lynwood Morene Lavone Delsie, MD 11/11/2023, 10:46 AM  Naloxone (Narcan) can help reverse an overdose when given to the victim quickly.  [place] offers free naloxone kits and instructions/training on its use.  Add naloxone to your first aid kit and you can help save a life. A prescription can be filled at your local pharmacy or free kits are provided by the county

## 2023-11-11 NOTE — ED Notes (Signed)
 Patient alert & oriented x4. Patient endorses thoughts to hurt self stating Always. I always have a plan., not for here though. It's hard here. Denies HI. Denies A/VH. Patient reports back and head pain rating 8/10, PRN Tylenol  administered with no complications per order. No acute distress noted. Support and encouragement provided. Routine safety checks conducted per facility protocol. Encouraged patient to notify staff if any thoughts of harm towards self or others arise. Patient verbalizes understanding and agreement.
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
## 2023-11-11 NOTE — Progress Notes (Signed)
 Pt has been accepted to [facility] on 11/11/2023 Bed assignment: 307-2  Pt meets inpatient criteria per: Lynwood Katz MD  Attending Physician will be. Rankin Montes, MD   Report can be called to: Adult unit: [phone]  Pt can arrive after DC   Care Team Notified: [facility] Cherylynn Ernst RN, April Coleman RN, Brittany Ward RN, Lynwood Katz MD, Rashell Mclean RN   [place] Kennadee Walthour LCSW-A   11/11/2023 10:41 AM
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
## 2023-11-11 NOTE — Plan of Care (Signed)
  Problem: Education: Goal: Knowledge of [facility] Education information/materials will improve Outcome: Progressing Goal: Verbalization of understanding the information provided will improve Outcome: Progressing   Problem: Coping: Goal: Ability to demonstrate self-control will improve Outcome: Progressing   Problem: Safety: Goal: Periods of time without injury will increase Outcome: Progressing

## 2023-11-11 NOTE — Progress Notes (Signed)
 Patient signed 72 hour request for discharge 11/11/23 at 1245.

## 2023-11-11 NOTE — [facility] Group Notes (Signed)
 [facility] Group Notes:  (Nursing/MHT/Case Management/Adjunct)  Date:  11/11/2023  Time:  2000  Type of Therapy:   wrap up group  Participation Level:  Active  Participation Quality:  Appropriate  Affect:  Appropriate  Cognitive:  Alert and Appropriate  Insight:  Appropriate  Engagement in Group:  Engaged  Modes of Intervention:  Discussion and Support  Summary of Progress/Problems: pt stated her day were a 1.5 . She didn't set a goal but were happy she received a visit from family today.  Cassius LOISE Dawn 11/11/2023, 8:53 PM

## 2023-11-11 NOTE — ED Notes (Signed)
 Patient resting quietly in bed with eyes closed. Respirations equal and unlabored, skin warm and dry, NAD. Routine safety checks conducted according to facility protocol. Will continue to monitor for safety.

## 2023-11-11 NOTE — Progress Notes (Signed)
   11/11/23 2100  Psych Admission Type (Psych Patients Only)  Admission Status Voluntary  Psychosocial Assessment  Patient Complaints Worrying  Eye Contact Fair  Facial Expression Anxious  Affect Appropriate to circumstance  Speech Logical/coherent  Interaction Assertive  Motor Activity Slow  Appearance/Hygiene Unremarkable  Behavior Characteristics Cooperative;Appropriate to situation  Mood Pleasant  Thought Process  Coherency WDL  Content WDL  Delusions None reported or observed  Perception WDL  Hallucination None reported or observed  Judgment Poor  Confusion None  Danger to Self  Current suicidal ideation? Denies  Self-Injurious Behavior No self-injurious ideation or behavior indicators observed or expressed   Agreement Not to Harm Self Yes  Description of Agreement verbal  Danger to Others  Danger to Others None reported or observed

## 2023-11-11 NOTE — Group Note (Deleted)
 Recreation Therapy Group Note   Group Topic:Problem Solving  Group Date: 11/11/2023 Start Time: 0940 End Time: 1011 Facilitators: Seiya Silsby-McCall, Aleczander Fandino A, NT Location: [address] Dayroom       Affect/Mood: {RT B[facility] Affect/Mood:26271}   Participation Level: {RT B[facility] Participation [employer]   Participation Quality: {RT [facility] Participation Quality:26268}   Behavior: {RT B[facility] Group Behavior:26269}   Speech/Thought Process: {RT B[facility] Speech/Thought:26276}   Insight: {RT B[facility] Insight:26272}   Judgement: {RT B[facility] Judgement:26278}   Modes of Intervention: {RT B[facility] Modes of Intervention:26277}   Patient Response to Interventions:  {RT B[facility] Patient Response to Intervention:26274}   Education Outcome:  {RT B[facility] Education Outcome:26279}   Clinical Observations/Individualized Feedback: *** was *** in their participation of session activities and group discussion. Pt identified ***   Plan: {RT B[facility] Tx Plan:26280}   Maveric Debono A Zellie Jenning-McCall, NT,  11/11/2023 12:23 PM

## 2023-11-11 NOTE — ED Notes (Signed)
 Patient transferred to [facility] per MD order. After Visit Summary (AVS) printed and given to patient. AVS reviewed with patient and all questions fully answered. Patient discharged in no acute distress, A& O x4 and ambulatory. Patient endorsed SI upon discharge, denied HI and AVH. Patient verbalized understanding of all discharge instructions explained by staff. Patient mood reported okay.  Patient belongings returned to patient from locker #17 complete and intact. Patient escorted to back sallyport via staff for transport to destination. Safety maintained.

## 2023-11-11 NOTE — Tx Team (Signed)
 Initial Treatment Plan 11/11/2023 1:25 PM Barbara Orozco FMW:[id number]    PATIENT STRESSORS: Loss of family and friends   Substance abuse     PATIENT STRENGTHS: Forensic Psychologist fund of knowledge  Motivation for treatment/growth  Supportive family/friends  Work skills    PATIENT IDENTIFIED PROBLEMS: Suicidal thoughts                     DISCHARGE CRITERIA:  Improved stabilization in mood, thinking, and/or behavior Need for constant or close observation no longer present  PRELIMINARY DISCHARGE PLAN: Return to previous living arrangement  PATIENT/FAMILY INVOLVEMENT: This treatment plan has been presented to and reviewed with the patient, [name].  The patient has been given the opportunity to ask questions and make suggestions.  Caci Orren  JAYSON Sharps, RN 11/11/2023, 1:25 PM

## 2023-11-11 NOTE — ED Notes (Signed)
 Patient tearful, stating she would like her home medications. Patient refused any assistance from writer when asked if there's anything we can do to help. Providers made aware. Environment secured, safety checks in place per facility policy.

## 2023-11-12 DIAGNOSIS — F3281 Premenstrual dysphoric disorder: Secondary | ICD-10-CM | POA: Insufficient documentation

## 2023-11-12 DIAGNOSIS — F314 Bipolar disorder, current episode depressed, severe, without psychotic features: Secondary | ICD-10-CM | POA: Diagnosis not present

## 2023-11-12 DIAGNOSIS — F319 Bipolar disorder, unspecified: Principal | ICD-10-CM

## 2023-11-12 MED ORDER — LIDOCAINE 5 % EX PTCH
1.0000 | MEDICATED_PATCH | CUTANEOUS | Status: DC
Start: 2023-11-12 — End: 2023-11-14
  Administered 2023-11-12 – 2023-11-14 (×3): 1 via TRANSDERMAL
  Filled 2023-11-12 (×3): qty 1

## 2023-11-12 MED ORDER — ONDANSETRON 4 MG PO TBDP
4.0000 mg | ORAL_TABLET | Freq: Two times a day (BID) | ORAL | Status: DC
  Administered 2023-11-12: 4 mg via ORAL
  Filled 2023-11-12 (×6): qty 1

## 2023-11-12 MED ORDER — LURASIDONE HCL 40 MG PO TABS
40.0000 mg | ORAL_TABLET | Freq: Every day | ORAL | Status: DC
  Administered 2023-11-12 – 2023-11-13 (×2): 40 mg via ORAL
  Filled 2023-11-12 (×3): qty 1

## 2023-11-12 MED ORDER — RAMELTEON 8 MG PO TABS
8.0000 mg | ORAL_TABLET | Freq: Every day | ORAL | Status: DC
  Administered 2023-11-12 – 2023-11-13 (×2): 8 mg via ORAL
  Filled 2023-11-12 (×4): qty 1

## 2023-11-12 MED ORDER — LAMOTRIGINE 150 MG PO TABS
150.0000 mg | ORAL_TABLET | Freq: Every day | ORAL | Status: DC
  Administered 2023-11-12 – 2023-11-14 (×3): 150 mg via ORAL
  Filled 2023-11-12 (×5): qty 1

## 2023-11-12 MED ORDER — DESVENLAFAXINE SUCCINATE ER 50 MG PO TB24
50.0000 mg | ORAL_TABLET | Freq: Every day | ORAL | Status: DC
  Administered 2023-11-12 – 2023-11-14 (×3): 50 mg via ORAL
  Filled 2023-11-12 (×5): qty 1

## 2023-11-12 MED ORDER — BUSPIRONE HCL 10 MG PO TABS
10.0000 mg | ORAL_TABLET | Freq: Three times a day (TID) | ORAL | Status: DC
Start: 2023-11-12 — End: 2023-11-12
  Administered 2023-11-12: 10 mg via ORAL
  Filled 2023-11-12 (×6): qty 1
  Filled 2023-11-12: qty 2

## 2023-11-12 MED ORDER — TRAZODONE HCL 100 MG PO TABS
200.0000 mg | ORAL_TABLET | Freq: Every day | ORAL | Status: DC
  Administered 2023-11-12 – 2023-11-13 (×2): 200 mg via ORAL
  Filled 2023-11-12 (×3): qty 2

## 2023-11-12 MED ORDER — ASPIRIN-ACETAMINOPHEN-CAFFEINE 250-250-65 MG PO TABS
1.0000 | ORAL_TABLET | Freq: Four times a day (QID) | ORAL | Status: DC | PRN
  Administered 2023-11-12 – 2023-11-14 (×3): 1 via ORAL
  Filled 2023-11-12 (×3): qty 1

## 2023-11-12 MED ORDER — NON FORMULARY: 50.0000 mg | Freq: Every day | Status: DC

## 2023-11-12 MED ORDER — ONDANSETRON 4 MG PO TBDP
4.0000 mg | ORAL_TABLET | Freq: Three times a day (TID) | ORAL | Status: DC | PRN
  Administered 2023-11-14: 4 mg via ORAL
  Filled 2023-11-12 (×2): qty 1

## 2023-11-12 MED ORDER — BUSPIRONE HCL 7.5 MG PO TABS
7.5000 mg | ORAL_TABLET | Freq: Three times a day (TID) | ORAL | Status: DC
  Filled 2023-11-12 (×2): qty 1

## 2023-11-12 NOTE — Plan of Care (Signed)
 Pt has been labile throughout the day

## 2023-11-12 NOTE — Group Note (Signed)
 Date:  11/12/2023 Time:  10:23 AM  Group Topic/Focus:  Goals Group:   The focus of this group is to help patients establish daily goals to achieve during treatment and discuss how the patient can incorporate goal setting into their daily lives to aide in recovery. Orientation:   The focus of this group is to educate the patient on the purpose and policies of crisis stabilization and provide a format to answer questions about their admission.  The group details unit policies and expectations of patients while admitted.    Participation Level:  Did Not Attend    [name] 11/12/2023, 10:23 AM

## 2023-11-12 NOTE — Plan of Care (Signed)
   Problem: Education: Goal: Knowledge of [facility] Education information/materials will improve Outcome: Progressing   Problem: Activity: Goal: Interest or engagement in activities will improve Outcome: Progressing   Problem: Coping: Goal: Ability to verbalize frustrations and anger appropriately will improve Outcome: Progressing   Problem: Safety: Goal: Periods of time without injury will increase Outcome: Progressing

## 2023-11-12 NOTE — BHH Group Notes (Signed)
 Adult Psychoeducational Group Note  Date:  11/12/2023 Time:  9:15 PM  Group Topic/Focus:  Wrap-Up Group:   The focus of this group is to help patients review their daily goal of treatment and discuss progress on daily workbooks.  Participation Level:  Active  Participation Quality:  Appropriate  Affect:  Appropriate  Cognitive:  Alert  Insight: Appropriate  Engagement in Group:  Engaged  Modes of Intervention:  Discussion  Additional Comments:  Patient attended and participated in the Wrap-up group.  Barbara Orozco 11/12/2023, 9:15 PM
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
## 2023-11-12 NOTE — Group Note (Signed)
 LCSW Group Therapy Note   Group Date: 11/12/2023 Start Time: 1100 End Time: 1200   Type of Therapy and Topic:  Group Therapy   Participation:  did not attend   Topic:  Effective Communication Skills  Goal of the Session: The goal of today's group session was to improve communication skills among participants, helping them express themselves more clearly, listen actively, and better understand others. This will contribute to healthier relationships and more effective problem-solving within the group and in daily life.  Objectives of the Session: To enhance active listening skills by engaging participants in exercises that improve their ability to focus on and understand others' points of view. To practice the use of I statements to help participants express their thoughts and feelings without sounding accusatory or confrontational. To discuss the importance of body language and other non-verbal communication cues and how they impact conversations. To help participants recognize the role of emotions in communication and develop strategies for managing emotions during conversations.  Summary:   In today's group session on communication, we began by discussing the importance of effective communication. We reviewed the different types of communication.  We then introduced the concept of active listening and "I" statements and practiced new communication skills using examples.  Participants learned how emotions can influence conversations and practiced techniques for managing strong emotions during discussions.  Participants were encouraged to reflect on one thing they would like to improve in their own communication and practice that in the coming week.  Therapeutic Modalities:  Elements of DBT   Catherene MALVA Dynes, LCSWA 11/12/2023  12:32 PM

## 2023-11-12 NOTE — Plan of Care (Signed)
   Problem: Safety: Goal: Periods of time without injury will increase Outcome: Progressing

## 2023-11-12 NOTE — Progress Notes (Signed)
 Pt irritable this morning and argumentative regarding scheduled meds. Unit schedule and meds reviewed with pt. Pt denies SI/HI/AVH.

## 2023-11-12 NOTE — H&P (Addendum)
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 for substance use disrupting patient's sleep and patient possibly being able to stay up due to her Ritalin use.  Patient has had increased depressive symptoms over the last few weeks.  Concern that Pristiq  may be further disrupting patient and patient also endorses taking 200 mg of her trazodone  rather than the prescribed 100 mg which could also be increasing serotonin. Will trial ramelteon . In a Bipolar  patient higher doses of trzodone could be further destabilizing mood as could daily marijuana use.  Patient would benefit from mood stabilization.  Will start Latuda  for mood stabilization.   Patient  willing to have Lamictal  increased which can help with depressive symptoms.  Will also decrease Pristiq  to help decrease  norepinephrine anergic effects that could possibly destabilizing patient bipolar disorder.  Will also continue to hold patient's stimulants.Patient does appear to meet criteria for PMDD, Pristiq  could be beneficial for this, patient also has a appointment with her OB/GYN to discuss other options.  Observation Level/Precautions: CBC: WNL W/exception platelet 411, CMP: WNL W/exception albumin 3.4/T protein 6.2, urine pregnancy: (-), EKG: QTc 427 heart rate 64, lipids cholesterol 242/TG 247/VLDL 49/LDL 151, UDS: Positive cannabis  Laboratory:    Psychotherapy:    Medications:    Consultations:    Discharge Concerns:    Estimated LOS:  Other:    Patient signed a 72-hour on 11/10/2022  Safety and Monitoring: Voluntary admission to inpatient psychiatric unit for safety, stabilization and treatment Daily contact with patient to assess and evaluate symptoms and progress in treatment Patient's case to be discussed in multi-disciplinary team meeting Observation Level : q15 minute checks Vital signs: q12 hours Precautions: suicide, but pt currently verbally contracts for safety on unit    Discharge Planning: Social work and case management to assist with discharge planning and identification of hospital follow-up needs prior to discharge Estimated LOS: 5-7 days Discharge Concerns: Need to establish a safety plan; Medication compliance and effectiveness Discharge Goals: Return home with outpatient referrals for mental health follow-up including medication management/psychotherapy.     Physician Treatment Plan for Primary Diagnosis: Suicidal ideation Long Term Goal(s): Improvement in symptoms so as ready for discharge  Short Term Goals: Ability to identify changes in lifestyle to reduce recurrence of condition will improve, Ability to verbalize feelings will improve, Ability to disclose and discuss suicidal ideas, Ability to demonstrate self-control will improve, Ability to identify and develop  effective coping behaviors will improve, Ability to maintain clinical measurements within normal limits will improve, Compliance with prescribed medications will improve, and Ability to identify triggers associated with substance abuse/mental health issues will improve  Physician Treatment Plan for Secondary Diagnosis: Principal Problem:   Suicidal ideation Active Problems:   Borderline personality disorder (HCC)   GERD (gastroesophageal reflux disease)   Psychogenic nonepileptic seizure   Endometriosis   Cannabis use disorder   Grief associated with loss of fetus   Bipolar disorder (HCC)   PMDD (premenstrual dysphoric disorder) R/O persistent depressive disorder  -Discontinue BuSpar  given additional serotonergic effects - Decrease Pristiq  to 50 mg daily - Hold methylphenidate 20 mg daily - Increase Lamictal  to 150 mg daily - Continue Klonopin  0.5 mg 3 times daily - Continue trazodone  100 mg nightly - Start Latuda  40mg  q supper - Start ramelteon  8mg  at bedtime, insomnia  Medical diagnoses - Restart home Zofran  4 mg every 8 hours as needed - Continue Prevalite  2 g twice daily  Long Term Goal(s): Improvement in symptoms so as ready for discharge  Short Term Goals: Ability to identify changes in lifestyle to reduce recurrence of condition will improve, Ability to verbalize feelings will improve, Ability to disclose and discuss suicidal ideas, Ability to demonstrate self-control will improve, Ability to identify and develop effective coping behaviors will improve, Ability to maintain clinical measurements within normal limits will improve, Compliance with prescribed medications will improve, and Ability to identify triggers associated with substance abuse/mental health issues will improve  I certify that inpatient services furnished can reasonably be expected to improve the patient's condition.    PGY-4 Reggie KATHEE Rice, MD 1/7/20252:32 PM

## 2023-11-12 NOTE — [facility] Group Notes (Signed)

## 2023-11-12 NOTE — Progress Notes (Addendum)
   11/12/23 2050  Psych Admission Type (Psych Patients Only)  Admission Status Voluntary/72 hour document signed  Date 72 hour document signed  11/11/23  Time 72 hour document signed  1245  Psychosocial Assessment  Patient Complaints Anxiety;Depression  Eye Contact Fair  Facial Expression Anxious  Affect Appropriate to circumstance  Speech Logical/coherent  Interaction Assertive  Motor Activity Other (Comment) (WNL)  Appearance/Hygiene Unremarkable  Behavior Characteristics Cooperative  Mood Depressed;Anxious  Thought Process  Coherency WDL  Content WDL  Delusions None reported or observed  Perception WDL  Hallucination None reported or observed  Judgment Impaired  Confusion None  Danger to Self  Current suicidal ideation? Denies  Self-Injurious Behavior No self-injurious ideation or behavior indicators observed or expressed   Agreement Not to Harm Self Yes  Description of Agreement verbal  Danger to Others  Danger to Others None reported or observed

## 2023-11-13 ENCOUNTER — Encounter (HOSPITAL_COMMUNITY): Payer: Self-pay

## 2023-11-13 DIAGNOSIS — F314 Bipolar disorder, current episode depressed, severe, without psychotic features: Secondary | ICD-10-CM

## 2023-11-13 MED ORDER — BENZONATATE 100 MG PO CAPS
100.0000 mg | ORAL_CAPSULE | Freq: Three times a day (TID) | ORAL | Status: DC
  Administered 2023-11-13 – 2023-11-14 (×4): 100 mg via ORAL
  Filled 2023-11-13 (×7): qty 1

## 2023-11-13 MED ORDER — PHENOL 1.4 % MT LIQD
1.0000 | OROMUCOSAL | Status: DC | PRN
  Administered 2023-11-13 (×3): 1 via OROMUCOSAL
  Filled 2023-11-13: qty 177

## 2023-11-13 NOTE — Progress Notes (Signed)
   11/13/23 0604  15 Minute Checks  Location Bedroom  Visual Appearance Calm  Behavior Composed  Sleep (Behavioral Health Patients Only)  Calculate sleep? (Click Yes once per 24 hr at 0600 safety check) Yes  Documented sleep last 24 hours 7.25

## 2023-11-13 NOTE — Progress Notes (Signed)
 [facility] MD Progress Note  11/13/2023 8:15 AM Barbara Orozco  MRN:  [id number] Subjective:  Barbara Orozco is a 38 yo patient w/ PPH of Bipolar d/o, PTSD, Grief, PTSD, PNES, and Borderline Personality d/o and medical hx of IBS ad endometriosis. Patient presented to [facility] endorsing SI w/ plan to harm self by cutting with a piece of glass.   Per RN notes, patient continues to be labile over the course of the day. Patient did attend wrap up group. PRNs: Tylenol .  On assessment this AM patient reports that she feels physically unwell today, but does not think it is due to medication. Patient reports that her throat is sore, she has a dry cough, and chest pain from coughing last night. Patient does not think she is having any side effects from starting Latuda . Patient reports that her anxiety did gradually decrease yesterday about being hospitalized and she does not feel dysphoric this AM. Patient reports that she ate dinner last night, but skipped breakfast this AM due to feeling unwell. Despite reporting a cough last night when falling asleep, patient reports that she slept very well. Patient denies SI, HI, and AVH today.   Principal Problem: Suicidal ideation Diagnosis: Principal Problem:   Suicidal ideation Active Problems:   Borderline personality disorder (HCC)   GERD (gastroesophageal reflux disease)   Psychogenic nonepileptic seizure   Endometriosis   Cannabis use disorder   Grief associated with loss of fetus   Bipolar disorder (HCC)   PMDD (premenstrual dysphoric disorder)  Total Time spent with patient: 15 minutes  Past Psychiatric History: Dx: Bipolar 2 d/o, Borderline Personality d/o,  PTSD, Grief, PTSD, PNES INPT: [facility] in 06/2023 endorses other hospitalizations as a minor OPT: Lauraine Barks, [place]   Medication Regimen per provider: Lamictal  100mg  Pristiq  100mg  Trazodone  100mg  Klonopin  0.5mg  TID Buspar  7.5mg  tid Methylphenidate 20mg  on work days Per patient and provider the are looking for  a trauma therapist for her. SA hx via OD on meds she found in her house Previous medications: Seroquel  these endorse feeling over sedated will refuse if offered), Abilify  (akathisia)  Past Medical History:  Past Medical History:  Diagnosis Date   Acute posttraumatic stress disorder    ADHD (attention deficit hyperactivity disorder), combined type    Allergy    Anal fistula    Arm fracture    right and left   Asthma    Bipolar 2 disorder (HCC)    Borderline personality disorder (HCC) 05/23/2022   Cannabis use disorder    Cocaine use disorder, moderate, in early remission (HCC)    Complication of anesthesia    wakes up angry for about 5 minutes   Depression    Endometriosis    Foot pain    right foot hurts since last night no reported injury per pt on 01-25-2022   GERD (gastroesophageal reflux disease)    Grief associated with loss of fetus    History of 2019 novel coronavirus disease (COVID-19) 04/29/2020   + Ag test - can see in Care everywhere   History of chicken pox    had 4 or 5 times as child   History of COVID-19 04/29/2020   mild all symptoms resolved   IBS (irritable bowel syndrome)    Irritable bowel    Leg fracture, left    Psychogenic nonepileptic seizure    last seizure 1 month ago per pt on 01-25-2022 saw dr lonne shivers neurology   Seizure [facility])    last seizure 12-28-2021 per pt  on 01-25-2022   Self-injurious behavior    Shingles    intermittent outbreaks   Ulcerative colitis (HCC)    Wears glasses     Past Surgical History:  Procedure Laterality Date   APPENDECTOMY     BIOPSY  06/06/2020   Procedure: BIOPSY;  Surgeon: Avram Lupita BRAVO, MD;  Location: [facility];  Service: Endoscopy;;   COLONOSCOPY WITH PROPOFOL  N/A 06/06/2020   Procedure: COLONOSCOPY WITH PROPOFOL ;  Surgeon: Avram Lupita BRAVO, MD;  Location: [facility];  Service: Endoscopy;  Laterality: N/A;   DIAGNOSTIC LAPAROSCOPY WITH REMOVAL OF ECTOPIC PREGNANCY Right 12/11/2021   Procedure:  LAPAROSCOPIC RIGHT SALPINGECTOMY WITH REMOVAL OF ECTOPIC PREGNANCY, lysis of adhesions;  Surgeon: Kandyce Sor, MD;  Location: [facility] OR;  Service: Gynecology;  Laterality: Right;   ESOPHAGOGASTRODUODENOSCOPY (EGD) WITH PROPOFOL  N/A 06/06/2020   Procedure: ESOPHAGOGASTRODUODENOSCOPY (EGD) WITH PROPOFOL ;  Surgeon: Avram Lupita BRAVO, MD;  Location: [facility];  Service: Endoscopy;  Laterality: N/A;   HAND SURGERY Left    x3   LIGATION OF INTERNAL FISTULA TRACT N/A 03/29/2023   Procedure: LIGATION OF INTERNAL FISTULA TRACT;  Surgeon: Debby Hila, MD;  Location: [facility];  Service: General;  Laterality: N/A;   PLACEMENT OF SETON N/A 01/30/2022   Procedure: PLACEMENT OF SETON;  Surgeon: Debby Hila, MD;  Location: [facility];  Service: General;  Laterality: N/A;   RECTAL EXAM UNDER ANESTHESIA N/A 01/30/2022   Procedure: ANAL EXAM UNDER ANESTHESIA;  Surgeon: Debby Hila, MD;  Location: [facility];  Service: General;  Laterality: N/A;   Family History:  Family History  Problem Relation Age of Onset   Colitis Mother    Ulcerative colitis Mother    Heart disease Mother    Endocrine tumor Mother        pituitary tumor   Colitis Father    Ulcerative colitis Father    Diabetes Father    Endocrine tumor Maternal Grandmother        piturtary tumor   Colitis Paternal Grandmother    Ulcerative colitis Paternal Grandmother    Stomach cancer Paternal Aunt    Colon cancer Neg Hx    Pancreatic cancer Neg Hx    Esophageal cancer Neg Hx    Family Psychiatric  History: Mom: Bipolar Maternal grandmother: Dissociative identity disorder Dad: Substance use disorder Paternal grandmother: MDD Social History:  Social History   Substance and Sexual Activity  Alcohol Use Yes   Alcohol/week: 6.0 standard drinks of alcohol   Types: 2 Glasses of wine, 2 Cans of beer, 2 Shots of liquor per week     Social History   Substance and Sexual Activity  Drug Use Yes   Types: Marijuana    Social History    Socioeconomic History   Marital status: Divorced    Spouse name: Not on file   Number of children: Not on file   Years of education: Not on file   Highest education level: Not on file  Occupational History    Employer: [employer]  Tobacco Use   Smoking status: Former    Types: Cigarettes   Smokeless tobacco: Never   Tobacco comments:    Social smoker quit 6-8 yrs ago per pt on 01-25-2022  Vaping Use   Vaping status: Never Used  Substance and Sexual Activity   Alcohol use: Yes    Alcohol/week: 6.0 standard drinks of alcohol    Types: 2 Glasses of wine, 2 Cans of beer, 2 Shots of liquor per week   Drug  use: Yes    Types: Marijuana   Sexual activity: Yes    Birth control/protection: None  Other Topics Concern   Not on file  Social History Narrative   Engaged   occ EtOH, + Marijuana, no tobacco now - former   Right Handed   Drinks Caffeine     One Story Home    Social Drivers of Health   Financial Resource Strain: Not on file  Food Insecurity: No Food Insecurity (11/11/2023)   Hunger Vital Sign    Worried About Running Out of Food in the Last Year: Never true    Ran Out of Food in the Last Year: Never true  Transportation Needs: No Transportation Needs (11/11/2023)   PRAPARE - Administrator, Civil Service (Medical): No    Lack of Transportation (Non-Medical): No  Physical Activity: Not on file  Stress: Not on file (09/08/2023)  Social Connections: Not on file   Additional Social History:                         Sleep: Good  Appetite:  Fair  Current Medications: Current Facility-Administered Medications  Medication Dose Route Frequency Provider Last Rate Last Admin   acetaminophen  (TYLENOL ) tablet 650 mg  650 mg Oral Q6H PRN Bobbitt, Shalon E, NP   650 mg at 11/13/23 9361   alum & mag hydroxide-simeth (MAALOX/MYLANTA) 200-200-20 MG/5ML suspension 30 mL  30 mL Oral Q4H PRN Bobbitt, Shalon E, NP       aspirin -acetaminophen -caffeine  (EXCEDRIN   MIGRAINE) per tablet 1 tablet  1 tablet Oral Q6H PRN Johny Lot, MD   1 tablet at 11/12/23 1636   benzonatate  (TESSALON ) capsule 100 mg  100 mg Oral TID Coalton Arch B, MD       cholestyramine  light (PREVALITE ) packet 2 g  2 g Oral Q12H Massengill, Nathan, MD   2 g at 11/12/23 2209   clonazePAM  (KLONOPIN ) tablet 0.5 mg  0.5 mg Oral Q8H Massengill, Nathan, MD   0.5 mg at 11/13/23 9270   desvenlafaxine  (PRISTIQ ) 24 hr tablet 50 mg  50 mg Oral Daily Massengill, Lot, MD   50 mg at 11/13/23 9270   haloperidol  (HALDOL ) tablet 5 mg  5 mg Oral TID PRN Bobbitt, Shalon E, NP       And   diphenhydrAMINE  (BENADRYL ) capsule 50 mg  50 mg Oral TID PRN Bobbitt, Shalon E, NP       haloperidol  lactate (HALDOL ) injection 5 mg  5 mg Intramuscular TID PRN Bobbitt, Shalon E, NP       And   diphenhydrAMINE  (BENADRYL ) injection 50 mg  50 mg Intramuscular TID PRN Bobbitt, Shalon E, NP       And   LORazepam  (ATIVAN ) injection 2 mg  2 mg Intramuscular TID PRN Bobbitt, Shalon E, NP       haloperidol  lactate (HALDOL ) injection 10 mg  10 mg Intramuscular TID PRN Bobbitt, Shalon E, NP       And   diphenhydrAMINE  (BENADRYL ) injection 50 mg  50 mg Intramuscular TID PRN Bobbitt, Shalon E, NP       And   LORazepam  (ATIVAN ) injection 2 mg  2 mg Intramuscular TID PRN Bobbitt, Shalon E, NP       hydrOXYzine  (ATARAX ) tablet 25 mg  25 mg Oral TID PRN Bobbitt, Shalon E, NP   25 mg at 11/12/23 1249   lamoTRIgine  (LAMICTAL ) tablet 150 mg  150 mg Oral Daily Janelle Culton B,  MD   150 mg at 11/13/23 0729   lidocaine  (LIDODERM ) 5 % 1 patch  1 patch Transdermal Q24H Massengill, Rankin, MD   1 patch at 11/12/23 1403   lurasidone  (LATUDA ) tablet 40 mg  40 mg Oral Q supper Jasma Seevers B, MD   40 mg at 11/12/23 1636   ondansetron  (ZOFRAN -ODT) disintegrating tablet 4 mg  4 mg Oral Q8H PRN Xylan Sheils B, MD       pantoprazole  (PROTONIX ) EC tablet 40 mg  40 mg Oral Daily Massengill, Nathan, MD   40 mg at 11/13/23 0729   phenol  (CHLORASEPTIC) mouth spray 1 spray  1 spray Mouth/Throat PRN Brentin Shin B, MD       ramelteon  (ROZEREM ) tablet 8 mg  8 mg Oral QHS Malique Driskill B, MD   8 mg at 11/12/23 2056   traZODone  (DESYREL ) tablet 100 mg  100 mg Oral QHS PRN Massengill, Rankin, MD   100 mg at 11/11/23 2056   traZODone  (DESYREL ) tablet 200 mg  200 mg Oral QHS Desire Fulp B, MD   200 mg at 11/12/23 2056    Lab Results: No results found for this or any previous visit (from the past 48 hours).  Blood Alcohol level:  Lab Results  Component Value Date   ETH <10 11/10/2023   ETH <10 07/12/2023    Metabolic Disorder Labs: Lab Results  Component Value Date   HGBA1C 5.4 07/16/2023   MPG 108 07/16/2023   MPG 102.54 05/23/2022   Lab Results  Component Value Date   PROLACTIN 16.1 09/20/2023   Lab Results  Component Value Date   CHOL 242 (H) 11/10/2023   TRIG 247 (H) 11/10/2023   HDL 42 11/10/2023   CHOLHDL 5.8 11/10/2023   VLDL 49 (H) 11/10/2023   LDLCALC 151 (H) 11/10/2023   LDLCALC 154 (H) 08/16/2023    Physical Findings: AIMS:  , ,  ,  ,    CIWA:    COWS:     Musculoskeletal: Strength & Muscle Tone: within normal limits Gait & Station:  remains in bed Patient leans: N/A  Psychiatric Specialty Exam:  Presentation  General Appearance:  Casual  Eye Contact: Fair  Speech: Clear and Coherent  Speech Volume: Normal  Handedness: Right   Mood and Affect  Mood: Euthymic  Affect: Appropriate   Thought Process  Thought Processes: Coherent  Descriptions of Associations:Intact  Orientation:Full (Time, Place and Person)  Thought Content:Logical  History of Schizophrenia/Schizoaffective disorder:No  Duration of Psychotic Symptoms:No data recorded Hallucinations:Hallucinations: None  Ideas of Reference:None  Suicidal Thoughts:Suicidal Thoughts: No  Homicidal Thoughts:Homicidal Thoughts: No   Sensorium  Memory: Immediate Good; Recent  Good  Judgment: Fair  Insight: Fair   Art Therapist  Concentration: Good  Attention Span: Good  Recall: Good  Fund of Knowledge: Good  Language: Good   Psychomotor Activity  Psychomotor Activity: Psychomotor Activity: Normal   Assets  Assets: Resilience; Social Support; Housing   Sleep  Sleep: Sleep: Good Number of Hours of Sleep: 7.25    Physical Exam: Physical Exam Constitutional:      Appearance: Normal appearance.  HENT:     Head: Normocephalic and atraumatic.  Pulmonary:     Effort: Pulmonary effort is normal.  Neurological:     Mental Status: She is alert and oriented to person, place, and time.    Review of Systems  Psychiatric/Behavioral:  Negative for depression, hallucinations and suicidal ideas. The patient does not have insomnia.    Blood  pressure 111/75, pulse 67, temperature 98 F (36.7 C), temperature source Oral, resp. rate 18, height 5' 6 (1.676 m), weight 128.4 kg, SpO2 97%. Body mass index is 45.68 kg/m.   Treatment Plan Summary: Daily contact with patient to assess and evaluate symptoms and progress in treatment and Medication management  Patient slept well with new medication. She is reporting some cold symptoms, but no fever during vitals. As patient has just started new meds and had adjustments will continue to monitor on current regimen. Also with patient 72hour form being signed will monitor on current regimen, as she will likely be discharged on this regimen if she does not meet criteria for IVC or voluntarily reverse her 72 hour form.   Patient signed a 72-hour on 11/10/2022   Safety and Monitoring: Voluntary admission to inpatient psychiatric unit for safety, stabilization and treatment Daily contact with patient to assess and evaluate symptoms and progress in treatment Patient's case to be discussed in multi-disciplinary team meeting Observation Level : q15 minute checks Vital signs: q12 hours Precautions:  suicide, but pt currently verbally contracts for safety on unit    Discharge Planning: Social work and case management to assist with discharge planning and identification of hospital follow-up needs prior to discharge Estimated LOS: 5-7 days Discharge Concerns: Need to establish a safety plan; Medication compliance and effectiveness Discharge Goals: Return home with outpatient referrals for mental health follow-up including medication management/psychotherapy.      Physician Treatment Plan for Primary Diagnosis: Suicidal ideation Long Term Goal(s): Improvement in symptoms so as ready for discharge   Short Term Goals: Ability to identify changes in lifestyle to reduce recurrence of condition will improve, Ability to verbalize feelings will improve, Ability to disclose and discuss suicidal ideas, Ability to demonstrate self-control will improve, Ability to identify and develop effective coping behaviors will improve, Ability to maintain clinical measurements within normal limits will improve, Compliance with prescribed medications will improve, and Ability to identify triggers associated with substance abuse/mental health issues will improve   Physician Treatment Plan for Secondary Diagnosis: Principal Problem:   Suicidal ideation Active Problems:   Borderline personality disorder (HCC)   GERD (gastroesophageal reflux disease)   Psychogenic nonepileptic seizure   Endometriosis   Cannabis use disorder   Grief associated with loss of fetus   Bipolar disorder (HCC)   PMDD (premenstrual dysphoric disorder) R/O persistent depressive disorder   - Discontinued home BuSpar  on admission given additional serotonergic effects - Continue Pristiq  to 50 mg daily - Hold methylphenidate 20 mg daily - Continue Lamictal  to 150 mg daily - Continue Klonopin  0.5 mg 3 times daily - Continue trazodone  100 mg nightly - Continue Latuda  40mg  q supper - Continue ramelteon  8mg  at bedtime, insomnia   Medical  diagnoses - Restart home Zofran  4 mg every 8 hours as needed - Continue Prevalite  2 g twice daily   Long Term Goal(s): Improvement in symptoms so as ready for discharge   Short Term Goals: Ability to identify changes in lifestyle to reduce recurrence of condition will improve, Ability to verbalize feelings will improve, Ability to disclose and discuss suicidal ideas, Ability to demonstrate self-control will improve, Ability to identify and develop effective coping behaviors will improve, Ability to maintain clinical measurements within normal limits will improve, Compliance with prescribed medications will improve, and Ability to identify triggers associated with substance abuse/mental health issues will improve   I certify that inpatient services furnished can reasonably be expected to improve the patient's condition.  PGY-4 Reggie KATHEE Rice, MD 11/13/2023, 8:15 AM

## 2023-11-13 NOTE — Group Note (Signed)
 Date:  11/13/2023 Time:  3:18 PM  Group Topic/Focus:  Identifying Needs:   The focus of this group is to help patients identify their personal needs that have been historically problematic and identify healthy behaviors to address their needs.    Participation Level:  Active  Participation Quality:  Appropriate  Affect:  Appropriate  Cognitive:  Appropriate  Insight: Appropriate  Engagement in Group:  Engaged  Modes of Intervention:  Education  Additional Comments:     Lenward JONETTA Samuel 11/13/2023, 3:18 PM

## 2023-11-13 NOTE — Group Note (Signed)
 Recreation Therapy Group Note   Group Topic:Stress Management  Group Date: 11/13/2023 Start Time: 0935 End Time: 1005 Facilitators: Jatavius Ellenwood-McCall, LRT,CTRS Location: [address] Dayroom   Group Topic: Stress Management   Goal Area(s) Addresses:  Patient will actively participate in stress management techniques presented during session.  Patient will successfully identify benefit of practicing stress management post d/c.   Intervention: Relaxation exercise with ambient sound and script   Group Description: Guided Imagery. LRT provided education, instruction, and demonstration on practice of visualization via guided imagery. Patient was asked to participate in the technique introduced during session. LRT debriefed including topics of mindfulness, stress management and specific scenarios each patient could use these techniques. Patients were given suggestions of ways to access scripts post d/c and encouraged to explore Youtube and other apps available on smartphones, tablets, and computers.  Education:  Stress Management, Discharge Planning.    Affect/Mood: N/A   Participation Level: Did not attend    Clinical Observations/Individualized Feedback:      Plan: Continue to engage patient in RT group sessions 2-3x/week.   Barbara Orozco, LRT,CTRS 11/13/2023 12:34 PM

## 2023-11-13 NOTE — Group Note (Signed)
 Date:  11/13/2023 Time:  9:32 PM  Group Topic/Focus:  Wrap-Up Group:   The focus of this group is to help patients review their daily goal of treatment and discuss progress on daily workbooks.    Participation Level:  Did Not Attend  Participation Quality:   N/A  Affect:   N/A  Cognitive:   N/A  Insight: None  Engagement in Group:   N/A  Modes of Intervention:   N/A  Additional Comments:  Patient did not attend wrap up group.   Barbara Orozco 11/13/2023, 9:32 PM

## 2023-11-13 NOTE — Progress Notes (Signed)
   11/13/23 0918  Psych Admission Type (Psych Patients Only)  Admission Status Voluntary/72 hour document signed  Date 72 hour document signed  11/11/23  Time 72 hour document signed  1245  Psychosocial Assessment  Patient Complaints Anxiety;Depression  Eye Contact Fair  Facial Expression Anxious  Affect Appropriate to circumstance  Speech Logical/coherent  Interaction Assertive  Motor Activity Other (Comment) (Q 15 minute safety checks)  Appearance/Hygiene Unremarkable  Behavior Characteristics Cooperative;Appropriate to situation  Mood Anxious;Depressed  Thought Process  Coherency WDL  Content WDL  Delusions None reported or observed  Perception WDL  Hallucination None reported or observed  Judgment Impaired  Confusion None  Danger to Self  Current suicidal ideation? Denies  Description of Suicide Plan No plan  Self-Injurious Behavior No self-injurious ideation or behavior indicators observed or expressed   Agreement Not to Harm Self Yes  Description of Agreement Verbal  Danger to Others  Danger to Others None reported or observed

## 2023-11-13 NOTE — BH IP Treatment Plan (Signed)
 Interdisciplinary Treatment and Diagnostic Plan Update  11/13/2023 Time of Session: 10:30 AM Barbara Orozco MRN: [id number]  Principal Diagnosis: Bipolar disorder (HCC)  Secondary Diagnoses: Principal Problem:   Bipolar disorder (HCC) Active Problems:   Borderline personality disorder (HCC)   GERD (gastroesophageal reflux disease)   Psychogenic nonepileptic seizure   Endometriosis   Cannabis use disorder   Grief associated with loss of fetus   PMDD (premenstrual dysphoric disorder)   Current Medications:  Current Facility-Administered Medications  Medication Dose Route Frequency Provider Last Rate Last Admin   acetaminophen  (TYLENOL ) tablet 650 mg  650 mg Oral Q6H PRN Bobbitt, Shalon E, NP   650 mg at 11/13/23 1241   alum & mag hydroxide-simeth (MAALOX/MYLANTA) 200-200-20 MG/5ML suspension 30 mL  30 mL Oral Q4H PRN Bobbitt, Shalon E, NP       aspirin -acetaminophen -caffeine  (EXCEDRIN  MIGRAINE) per tablet 1 tablet  1 tablet Oral Q6H PRN Johny Lot, MD   1 tablet at 11/13/23 9050   benzonatate  (TESSALON ) capsule 100 mg  100 mg Oral TID McQuilla, Jai B, MD   100 mg at 11/13/23 1240   cholestyramine  light (PREVALITE ) packet 2 g  2 g Oral Q12H Massengill, Lot, MD   2 g at 11/13/23 1100   clonazePAM  (KLONOPIN ) tablet 0.5 mg  0.5 mg Oral Q8H Massengill, Nathan, MD   0.5 mg at 11/13/23 9270   desvenlafaxine  (PRISTIQ ) 24 hr tablet 50 mg  50 mg Oral Daily Massengill, Lot, MD   50 mg at 11/13/23 9270   haloperidol  (HALDOL ) tablet 5 mg  5 mg Oral TID PRN Bobbitt, Shalon E, NP       And   diphenhydrAMINE  (BENADRYL ) capsule 50 mg  50 mg Oral TID PRN Bobbitt, Shalon E, NP       haloperidol  lactate (HALDOL ) injection 5 mg  5 mg Intramuscular TID PRN Bobbitt, Shalon E, NP       And   diphenhydrAMINE  (BENADRYL ) injection 50 mg  50 mg Intramuscular TID PRN Bobbitt, Shalon E, NP       And   LORazepam  (ATIVAN ) injection 2 mg  2 mg Intramuscular TID PRN Bobbitt, Shalon E, NP        haloperidol  lactate (HALDOL ) injection 10 mg  10 mg Intramuscular TID PRN Bobbitt, Shalon E, NP       And   diphenhydrAMINE  (BENADRYL ) injection 50 mg  50 mg Intramuscular TID PRN Bobbitt, Shalon E, NP       And   LORazepam  (ATIVAN ) injection 2 mg  2 mg Intramuscular TID PRN Bobbitt, Shalon E, NP       hydrOXYzine  (ATARAX ) tablet 25 mg  25 mg Oral TID PRN Bobbitt, Shalon E, NP   25 mg at 11/12/23 1249   lamoTRIgine  (LAMICTAL ) tablet 150 mg  150 mg Oral Daily McQuilla, Jai B, MD   150 mg at 11/13/23 0729   lidocaine  (LIDODERM ) 5 % 1 patch  1 patch Transdermal Q24H Massengill, Lot, MD   1 patch at 11/12/23 1403   lurasidone  (LATUDA ) tablet 40 mg  40 mg Oral Q supper McQuilla, Jai B, MD   40 mg at 11/12/23 1636   ondansetron  (ZOFRAN -ODT) disintegrating tablet 4 mg  4 mg Oral Q8H PRN McQuilla, Jai B, MD       pantoprazole  (PROTONIX ) EC tablet 40 mg  40 mg Oral Daily Massengill, Nathan, MD   40 mg at 11/13/23 0729   phenol (CHLORASEPTIC) mouth spray 1 spray  1 spray Mouth/Throat PRN McQuilla, Jai B,  MD   1 spray at 11/13/23 1325   ramelteon  (ROZEREM ) tablet 8 mg  8 mg Oral QHS McQuilla, Jai B, MD   8 mg at 11/12/23 2056   traZODone  (DESYREL ) tablet 100 mg  100 mg Oral QHS PRN Johny Lot, MD   100 mg at 11/11/23 2056   traZODone  (DESYREL ) tablet 200 mg  200 mg Oral QHS McQuilla, Jai B, MD   200 mg at 11/12/23 2056   PTA Medications: Medications Prior to Admission  Medication Sig Dispense Refill Last Dose/Taking   albuterol  (VENTOLIN  HFA) 108 (90 Base) MCG/ACT inhaler Inhale 2 puffs into the lungs every 6 (six) hours as needed for wheezing or shortness of breath. 8 g 2    cholestyramine  light (PREVALITE ) 4 g packet Take 0.5 packets (2 g total) by mouth 2 (two) times daily. 60 each 0    clonazePAM  (KLONOPIN ) 0.5 MG tablet Take 1 tablet (0.5 mg total) by mouth 3 (three) times daily. (Patient taking differently: Take 0.5 mg by mouth 3 (three) times daily as needed for anxiety.) 30 tablet 0     desvenlafaxine  (PRISTIQ ) 50 MG 24 hr tablet Take 50 mg by mouth daily.      DIALYVITE VITAMIN D3 MAX 1.25 MG (50000 UT) TABS TAKE 1 TABLET BY MOUTH ONCE A WEEK (Patient taking differently: Take 50,000 Units by mouth every Friday.) 4 tablet 11    lamoTRIgine  (LAMICTAL ) 100 MG tablet Take 100 mg by mouth daily.      lidocaine  (LIDODERM ) 5 % Place 1 patch onto the skin daily as needed (For back pain). Remove & Discard patch within 12 hours or as directed by MD      ondansetron  (ZOFRAN -ODT) 4 MG disintegrating tablet Take 1 tablet (4 mg total) by mouth every 8 (eight) hours as needed for nausea or vomiting. 30 tablet 5    OVER THE COUNTER MEDICATION Take 1 capsule by mouth in the morning and at bedtime. Myo-Inositol Supplement      pantoprazole  (PROTONIX ) 40 MG tablet Take 1 tablet (40 mg total) by mouth daily.      traZODone  (DESYREL ) 100 MG tablet Take 1 tablet (100 mg total) by mouth at bedtime as needed for sleep. (Patient taking differently: Take 200 mg by mouth at bedtime.) 30 tablet 3     Patient Stressors: Loss of family and friends   Substance abuse    Patient Strengths: Forensic Psychologist fund of knowledge  Motivation for treatment/growth  Supportive family/friends  Work skills   Treatment Modalities: Medication Management, Group therapy, Case management,  1 to 1 session with clinician, Psychoeducation, Recreational therapy.   Physician Treatment Plan for Primary Diagnosis: Bipolar disorder (HCC) Long Term Goal(s): Improvement in symptoms so as ready for discharge   Short Term Goals: Ability to identify changes in lifestyle to reduce recurrence of condition will improve Ability to verbalize feelings will improve Ability to disclose and discuss suicidal ideas Ability to demonstrate self-control will improve Ability to identify and develop effective coping behaviors will improve Ability to maintain clinical measurements within normal limits will improve Compliance with  prescribed medications will improve Ability to identify triggers associated with substance abuse/mental health issues will improve  Medication Management: Evaluate patient's response, side effects, and tolerance of medication regimen.  Therapeutic Interventions: 1 to 1 sessions, Unit Group sessions and Medication administration.  Evaluation of Outcomes: Not Progressing  Physician Treatment Plan for Secondary Diagnosis: Principal Problem:   Bipolar disorder (HCC) Active Problems:   Borderline personality  disorder (HCC)   GERD (gastroesophageal reflux disease)   Psychogenic nonepileptic seizure   Endometriosis   Cannabis use disorder   Grief associated with loss of fetus   PMDD (premenstrual dysphoric disorder)  Long Term Goal(s): Improvement in symptoms so as ready for discharge   Short Term Goals: Ability to identify changes in lifestyle to reduce recurrence of condition will improve Ability to verbalize feelings will improve Ability to disclose and discuss suicidal ideas Ability to demonstrate self-control will improve Ability to identify and develop effective coping behaviors will improve Ability to maintain clinical measurements within normal limits will improve Compliance with prescribed medications will improve Ability to identify triggers associated with substance abuse/mental health issues will improve     Medication Management: Evaluate patient's response, side effects, and tolerance of medication regimen.  Therapeutic Interventions: 1 to 1 sessions, Unit Group sessions and Medication administration.  Evaluation of Outcomes: Not Progressing   RN Treatment Plan for Primary Diagnosis: Bipolar disorder (HCC) Long Term Goal(s): Knowledge of disease and therapeutic regimen to maintain health will improve  Short Term Goals: Ability to remain free from injury will improve, Ability to verbalize frustration and anger appropriately will improve, Ability to demonstrate  self-control, Ability to participate in decision making will improve, Ability to verbalize feelings will improve, Ability to disclose and discuss suicidal ideas, Ability to identify and develop effective coping behaviors will improve, and Compliance with prescribed medications will improve  Medication Management: RN will administer medications as ordered by provider, will assess and evaluate patient's response and provide education to patient for prescribed medication. RN will report any adverse and/or side effects to prescribing provider.  Therapeutic Interventions: 1 on 1 counseling sessions, Psychoeducation, Medication administration, Evaluate responses to treatment, Monitor vital signs and CBGs as ordered, Perform/monitor CIWA, COWS, AIMS and Fall Risk screenings as ordered, Perform wound care treatments as ordered.  Evaluation of Outcomes: Not Progressing   LCSW Treatment Plan for Primary Diagnosis: Bipolar disorder [facility]) Long Term Goal(s): Safe transition to appropriate next level of care at discharge, Engage patient in therapeutic group addressing interpersonal concerns.  Short Term Goals: Engage patient in aftercare planning with referrals and resources, Increase social support, Increase ability to appropriately verbalize feelings, Increase emotional regulation, Facilitate acceptance of mental health diagnosis and concerns, Facilitate patient progression through stages of change regarding substance use diagnoses and concerns, Identify triggers associated with mental health/substance abuse issues, and Increase skills for wellness and recovery  Therapeutic Interventions: Assess for all discharge needs, 1 to 1 time with Social worker, Explore available resources and support systems, Assess for adequacy in community support network, Educate family and significant other(s) on suicide prevention, Complete Psychosocial Assessment, Interpersonal group therapy.  Evaluation of Outcomes: Not  Progressing   Progress in Treatment: Attending groups: Yes. Participating in groups: Yes. Taking medication as prescribed: Yes. Toleration medication: Yes. Family/Significant other contact made: No [name] boyfriend [phone] Patient understands diagnosis: Yes. Discussing patient identified problems/goals with staff: Yes. Medical problems stabilized or resolved: Yes. Denies suicidal/homicidal ideation: Yes. Issues/concerns per patient self-inventory: No.  New problem(s) identified:  No  New Short Term/Long Term Goal(s):   medication stabilization, elimination of SI thoughts, development of comprehensive mental wellness plan.    Patient Goals:  get my medications strengthened out to manage panic attacks.  Patient has a psychiatrist and would like a referral to a trauma-focused therapist.  Discharge Plan or Barriers:  Patient recently admitted. CSW will continue to follow and assess for appropriate referrals and possible  discharge planning.    Reason for Continuation of Hospitalization: Anxiety Medication stabilization  Estimated Length of Stay:  5 - 7 days  Last 3 [place] Suicide Severity Risk Score: Flowsheet Row [facility] (Current) [facility] from 11/10/2023 in [facility] ED from [facility]/30/2023 in [facility] Emergency Department at [facility]  C-SSRS RISK CATEGORY High Risk High Risk No Risk       Last [facility] 2/9 Scores:    09/11/2023    4:06 PM 09/06/2023    8:12 AM 08/16/2023    8:48 AM  Depression screen PHQ 2/9  Decreased Interest 0 0 2  Down, Depressed, Hopeless 0 0 2  PHQ - 2 Score 0 0 4  Altered sleeping  0 3  Tired, decreased energy  0 3  Change in appetite  0 2  Feeling bad or failure about yourself   0 1  Trouble concentrating  0 3  Moving slowly or fidgety/restless  0 3  Suicidal thoughts  0 1  PHQ-9 Score  0 20  Difficult doing work/chores   Very  difficult    Scribe for Treatment Team: Kymari Nuon O Wynell Halberg, LCSWA 11/13/2023 1:32 PM

## 2023-11-13 NOTE — Progress Notes (Signed)
 Pt to discharge on 11/14/2023 and pt's boyfriend Gordy Savers  [phone]  will transport.

## 2023-11-13 NOTE — [facility] Suicide Risk Assessment (Signed)
 [facility] INPATIENT:  Family/Significant Other Suicide Prevention Education  Suicide Prevention Education:  Education Completed; boyfriend Barbara Orozco  [phone]  will transport. ,  (name of family member/significant other) has been identified by the patient as the family member/significant other with whom the patient will be residing, and identified as the person(s) who will aid the patient in the event of a mental health crisis (suicidal ideations/suicide attempt).  With written consent from the patient, the family member/significant other has been provided the following suicide prevention education, prior to the and/or following the discharge of the patient.  CSW received permission to speak with pt's live in boyfriend who reported no safety concerns with pt returning home. Orozco reported no firearms in the home. He has locked away all knives, sharps and stored them in a place where pt is not aware. He has medications locked in the trunk of the car and she does not have access to keys. He also monitors her medication intake.  The suicide prevention education provided includes the following: Suicide risk factors Suicide prevention and interventions National Suicide Hotline telephone number [facility] assessment telephone number [facility] Emergency Assistance 911 [facility] and/or Residential Mobile Crisis Unit telephone number  Request made of family/significant other to: Remove weapons (e.g., guns, rifles, knives), all items previously/currently identified as safety concern.   Remove drugs/medications (over-the-counter, prescriptions, illicit drugs), all items previously/currently identified as a safety concern.  The family member/significant other verbalizes understanding of the suicide prevention education information provided.  The family member/significant other agrees to remove the items of safety concern listed above.  Benjaman Donia SAUNDERS 11/13/2023, 2:34 PM

## 2023-11-13 NOTE — Plan of Care (Signed)
   Problem: Education: Goal: Emotional status will improve Outcome: Progressing Goal: Mental status will improve Outcome: Progressing   Problem: Activity: Goal: Sleeping patterns will improve Outcome: Progressing

## 2023-11-13 NOTE — Plan of Care (Signed)
  Problem: Education: Goal: Emotional status will improve Outcome: Progressing Goal: Mental status will improve Outcome: Progressing   Problem: Health Behavior/Discharge Planning: Goal: Identification of resources available to assist in meeting health care needs will improve Outcome: Progressing

## 2023-11-14 MED ORDER — RAMELTEON 8 MG PO TABS: 8.0000 mg | ORAL_TABLET | Freq: Every day | ORAL | 0 refills | Status: DC

## 2023-11-14 MED ORDER — DESVENLAFAXINE SUCCINATE ER 50 MG PO TB24: 50.0000 mg | ORAL_TABLET | Freq: Every day | ORAL | 0 refills | Status: DC

## 2023-11-14 MED ORDER — LURASIDONE HCL 40 MG PO TABS: 40.0000 mg | ORAL_TABLET | Freq: Every day | ORAL | 0 refills | Status: DC

## 2023-11-14 MED ORDER — TRAZODONE HCL 100 MG PO TABS: 100.0000 mg | ORAL_TABLET | Freq: Every evening | ORAL | 0 refills | Status: DC | PRN

## 2023-11-14 MED ORDER — LAMOTRIGINE 150 MG PO TABS: 150.0000 mg | ORAL_TABLET | Freq: Every day | ORAL | 0 refills | Status: DC

## 2023-11-14 NOTE — Progress Notes (Signed)
 Pt discharged to lobby. Pt was stable and appreciative at that time. All papers and electronic prescriptions were given and valuables returned. Suicide safety plan completed. Copy given to pt-original in shadow chart. Verbal understanding expressed. Denies SI/HI and A/VH. Pt given opportunity to express concerns and ask questions.

## 2023-11-14 NOTE — Progress Notes (Signed)
   11/14/23 0900  Psych Admission Type (Psych Patients Only)  Admission Status Voluntary/72 hour document signed  Date 72 hour document signed  11/11/23  Time 72 hour document signed  1245  Psychosocial Assessment  Patient Complaints Malaise  Eye Contact Fair  Facial Expression Animated  Affect Appropriate to circumstance  Speech Logical/coherent  Interaction Assertive  Motor Activity Other (Comment) (steady gait)  Appearance/Hygiene Unremarkable  Behavior Characteristics Cooperative;Appropriate to situation  Mood Euthymic  Thought Process  Coherency WDL  Content WDL  Delusions None reported or observed  Perception WDL  Hallucination None reported or observed  Judgment WDL  Confusion None  Danger to Self  Current suicidal ideation? Denies  Self-Injurious Behavior No self-injurious ideation or behavior indicators observed or expressed
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
## 2023-11-14 NOTE — Discharge Summary (Signed)
 Physician Discharge Summary Note  Patient:  Barbara Orozco is an 38 y.o., female MRN:  [id number] DOB:  16-Aug-1986 Patient phone:  [phone] (home)  Patient address:   [address] [place] [address],  Total Time spent with patient: 15 minutes  Date of Admission:  11/11/2023 Date of Discharge: 11/14/2023  Reason for Admission:  Barbara Orozco is a 31 yo patient w/ PPH of Bipolar d/o, PTSD, Grief, PTSD, PNES, and Borderline Personality d/o and medical hx of IBS ad endometriosis. Patient presented to [facility] endorsing SI w/ plan to harm self by cutting with a piece of glass.   Principal Problem: Bipolar disorder [facility]) Discharge Diagnoses: Principal Problem:   Bipolar disorder (HCC) Active Problems:   Borderline personality disorder (HCC)   GERD (gastroesophageal reflux disease)   Psychogenic nonepileptic seizure   Endometriosis   Cannabis use disorder   Grief associated with loss of fetus   PMDD (premenstrual dysphoric disorder)   Past Psychiatric History: Dx: Bipolar 2 d/o, Borderline Personality d/o,  PTSD, Grief, PTSD, PNES INPT: [facility] in 06/2023 endorses other hospitalizations as a minor OPT: Lauraine Barks, PA   Medication Regimen per provider: Lamictal  100mg  Pristiq  100mg  Trazodone  100mg  Klonopin  0.5mg  TID Buspar  7.5mg  tid Methylphenidate 20mg  on work days Per patient and provider the are looking for a trauma therapist for her. SA hx via OD on meds she found in her house Previous medications: Seroquel  these endorse feeling over sedated will refuse if offered), Abilify  (akathisia)  Past Medical History:  Past Medical History:  Diagnosis Date   Acute posttraumatic stress disorder    ADHD (attention deficit hyperactivity disorder), combined type    Allergy    Anal fistula    Arm fracture    right and left   Asthma    Bipolar 2 disorder (HCC)    Borderline personality disorder (HCC) 05/23/2022   Cannabis use disorder    Cocaine use disorder, moderate, in early  remission (HCC)    Complication of anesthesia    wakes up angry for about 5 minutes   Depression    Endometriosis    Foot pain    right foot hurts since last night no reported injury per pt on 01-25-2022   GERD (gastroesophageal reflux disease)    Grief associated with loss of fetus    History of 2019 novel coronavirus disease (COVID-19) 04/29/2020   + Ag test - can see in Care everywhere   History of chicken pox    had 4 or 5 times as child   History of COVID-19 04/29/2020   mild all symptoms resolved   IBS (irritable bowel syndrome)    Irritable bowel    Leg fracture, left    Psychogenic nonepileptic seizure    last seizure 1 month ago per pt on 01-25-2022 saw dr lonne shivers neurology   Seizure [facility])    last seizure 12-28-2021 per pt on 01-25-2022   Self-injurious behavior    Shingles    intermittent outbreaks   Ulcerative colitis (HCC)    Wears glasses     Past Surgical History:  Procedure Laterality Date   APPENDECTOMY     BIOPSY  06/06/2020   Procedure: BIOPSY;  Surgeon: Avram Lupita BRAVO, MD;  Location: [facility];  Service: Endoscopy;;   COLONOSCOPY WITH PROPOFOL  N/A 06/06/2020   Procedure: COLONOSCOPY WITH PROPOFOL ;  Surgeon: Avram Lupita BRAVO, MD;  Location: [facility];  Service: Endoscopy;  Laterality: N/A;   DIAGNOSTIC LAPAROSCOPY WITH REMOVAL OF ECTOPIC PREGNANCY Right 12/11/2021  Procedure: LAPAROSCOPIC RIGHT SALPINGECTOMY WITH REMOVAL OF ECTOPIC PREGNANCY, lysis of adhesions;  Surgeon: Kandyce Sor, MD;  Location: [facility];  Service: Gynecology;  Laterality: Right;   ESOPHAGOGASTRODUODENOSCOPY (EGD) WITH PROPOFOL  N/A 06/06/2020   Procedure: ESOPHAGOGASTRODUODENOSCOPY (EGD) WITH PROPOFOL ;  Surgeon: Avram Lupita BRAVO, MD;  Location: [facility];  Service: Endoscopy;  Laterality: N/A;   HAND SURGERY Left    x3   LIGATION OF INTERNAL FISTULA TRACT N/A 03/29/2023   Procedure: LIGATION OF INTERNAL FISTULA TRACT;  Surgeon: Debby Hila, MD;  Location: [facility];  Service:  General;  Laterality: N/A;   PLACEMENT OF SETON N/A 01/30/2022   Procedure: PLACEMENT OF SETON;  Surgeon: Debby Hila, MD;  Location: [facility];  Service: General;  Laterality: N/A;   RECTAL EXAM UNDER ANESTHESIA N/A 01/30/2022   Procedure: ANAL EXAM UNDER ANESTHESIA;  Surgeon: Debby Hila, MD;  Location: [facility];  Service: General;  Laterality: N/A;   Family History:  Family History  Problem Relation Age of Onset   Colitis Mother    Ulcerative colitis Mother    Heart disease Mother    Endocrine tumor Mother        pituitary tumor   Colitis Father    Ulcerative colitis Father    Diabetes Father    Endocrine tumor Maternal Grandmother        piturtary tumor   Colitis Paternal Grandmother    Ulcerative colitis Paternal Grandmother    Stomach cancer Paternal Aunt    Colon cancer Neg Hx    Pancreatic cancer Neg Hx    Esophageal cancer Neg Hx    Family Psychiatric  History: Mom: Bipolar Maternal grandmother: Dissociative identity disorder Dad: Substance use disorder Paternal grandmother: MDD Social History:  Social History   Substance and Sexual Activity  Alcohol Use Yes   Alcohol/week: 6.0 standard drinks of alcohol   Types: 2 Glasses of wine, 2 Cans of beer, 2 Shots of liquor per week     Social History   Substance and Sexual Activity  Drug Use Yes   Types: Marijuana    Social History   Socioeconomic History   Marital status: Divorced    Spouse name: Not on file   Number of children: Not on file   Years of education: Not on file   Highest education level: Not on file  Occupational History    Employer: [employer]  Tobacco Use   Smoking status: Former    Types: Cigarettes   Smokeless tobacco: Never   Tobacco comments:    Social smoker quit 6-8 yrs ago per pt on 01-25-2022  Vaping Use   Vaping status: Never Used  Substance and Sexual Activity   Alcohol use: Yes    Alcohol/week: 6.0 standard drinks of alcohol    Types: 2 Glasses of wine, 2 Cans of beer, 2 Shots  of liquor per week   Drug use: Yes    Types: Marijuana   Sexual activity: Yes    Birth control/protection: None  Other Topics Concern   Not on file  Social History Narrative   Engaged   occ EtOH, + Marijuana, no tobacco now - former   Right Handed   Drinks Caffeine     One Story Home    Social Drivers of Health   Financial Resource Strain: Not on file  Food Insecurity: No Food Insecurity (11/11/2023)   Hunger Vital Sign    Worried About Running Out of Food in the Last Year: Never true    Ran  Out of Food in the Last Year: Never true  Transportation Needs: No Transportation Needs (11/11/2023)   PRAPARE - Administrator, Civil Service (Medical): No    Lack of Transportation (Non-Medical): No  Physical Activity: Not on file  Stress: Not on file (09/08/2023)  Social Connections: Not on file    Hospital Course:    On day of discharge patient continued to endorse congestion, but reported that she still slept ok and felt that she had benefited from her hospitalization and medication adjustments. Patient reported that she had still gotten benefit from some groups and learned of new community resources. Patient denies SI, HI, and AVH. Patient endorses her mood has improved as she is less tearful and less labile and she had gotten along well with others on the unit.   During the patient's hospitalization, patient had extensive initial psychiatric evaluation, and follow-up psychiatric evaluations every day.   Psychiatric diagnoses provided upon initial assessment:  Bipolar 2 disorder Borderline personality disorder (HCC)  Cannabis use disorder  PMDD (premenstrual dysphoric disorder)    Patient's psychiatric medications were adjusted on admission:  -- Discontinue home BuSpar  given additional serotonergic effects - Decrease home Pristiq  to 50 mg daily - Hold home methylphenidate 20 mg daily - Increase home Lamictal  to 150 mg daily - Continue home Klonopin  0.5 mg 3 times daily -  Continue home trazodone  100 mg nightly - Start Latuda  40mg  q supper - Start ramelteon  8mg  at bedtime, insomnia     During the hospitalization, other adjustments were made to the patient's psychiatric medication regimen:  None   Gradually, patient started adjusting to milieu.   Patient's care was discussed during the interdisciplinary team meeting every day during the hospitalization.   The patient denied having side effects to prescribed psychiatric medication.   The patient reports their target psychiatric symptoms of impulsivity, insomnia, labile and dysphoric mood responded well to the psychiatric medications, and the patient reports overall benefit other psychiatric hospitalization. Supportive psychotherapy was provided to the patient. The patient also participated in regular group therapy while admitted.    Labs were reviewed with the patient, and abnormal results were discussed with the patient.   The patient denied having suicidal thoughts more than 48 hours prior to discharge.  Patient denies having homicidal thoughts.  Patient denies having auditory hallucinations.  Patient denies any visual hallucinations.  Patient denies having paranoid thoughts. Patient signed a 72 hour form that expires on day of discharge. Patient did not meet criteria for IVC and did not wish to void the form. Patient endorsed having cold symptoms and made an appt with her PCP on day of discharge for assessment, but denied feeling that symptoms were due to medication as they included: headache, sore throat, congestion and cough.    The patient is able to verbalize their individual safety plan to this provider.   It is recommended to the patient to continue psychiatric medications as prescribed, after discharge from the hospital.     It is recommended to the patient to follow up with your outpatient psychiatric provider and PCP.   Discussed with the patient, the impact of alcohol, drugs, tobacco have been there  overall psychiatric and medical wellbeing, and total abstinence from substance use was recommended the patient.   Physical Findings: AIMS:  , ,  ,  ,    CIWA:    COWS:     Musculoskeletal: Strength & Muscle Tone: within normal limits Gait & Station: normal Patient leans:  N/A   Psychiatric Specialty Exam:  Presentation  General Appearance:  Appropriate for Environment  Eye Contact: Good  Speech: Clear and Coherent  Speech Volume: Normal  Handedness: Right   Mood and Affect  Mood: Euthymic  Affect: Appropriate   Thought Process  Thought Processes: Coherent  Descriptions of Associations:Intact  Orientation:Full (Time, Place and Person)  Thought Content:Logical  History of Schizophrenia/Schizoaffective disorder:No  Duration of Psychotic Symptoms:No data recorded Hallucinations:Hallucinations: None  Ideas of Reference:None  Suicidal Thoughts:Suicidal Thoughts: No  Homicidal Thoughts:Homicidal Thoughts: No   Sensorium  Memory: Immediate Good; Recent Good  Judgment: Fair  Insight: Fair   Art Therapist  Concentration: Good  Attention Span: Good  Recall: Good  Fund of Knowledge: Good  Language: Good   Psychomotor Activity  Psychomotor Activity: Psychomotor Activity: Normal   Assets  Assets: Communication Skills; Housing; Resilience; Social Support   Sleep  Sleep: Sleep: Fair Number of Hours of Sleep: 5.5    Physical Exam: Physical Exam Constitutional:      Appearance: Normal appearance.  HENT:     Head: Normocephalic and atraumatic.  Pulmonary:     Effort: Pulmonary effort is normal.  Neurological:     Mental Status: She is alert and oriented to person, place, and time.    Review of Systems  Psychiatric/Behavioral:  Negative for hallucinations and suicidal ideas. The patient is not nervous/anxious and does not have insomnia.    Blood pressure (!) 108/51, pulse 87, temperature 98.1 F (36.7 C),  resp. rate 16, height 5' 6 (1.676 m), weight 128.4 kg, SpO2 98%. Body mass index is 45.68 kg/m.   Social History   Tobacco Use  Smoking Status Former   Types: Cigarettes  Smokeless Tobacco Never  Tobacco Comments   Social smoker quit 6-8 yrs ago per pt on 01-25-2022   Tobacco Cessation:  N/A, patient does not currently use tobacco products   Blood Alcohol level:  Lab Results  Component Value Date   ETH <10 11/10/2023   ETH <10 07/12/2023    Metabolic Disorder Labs:  Lab Results  Component Value Date   HGBA1C 5.4 07/16/2023   MPG 108 07/16/2023   MPG 102.54 05/23/2022   Lab Results  Component Value Date   PROLACTIN 16.1 09/20/2023   Lab Results  Component Value Date   CHOL 242 (H) 11/10/2023   TRIG 247 (H) 11/10/2023   HDL 42 11/10/2023   CHOLHDL 5.8 11/10/2023   VLDL 49 (H) 11/10/2023   LDLCALC 151 (H) 11/10/2023   LDLCALC 154 (H) 08/16/2023    See Psychiatric Specialty Exam and Suicide Risk Assessment completed by Attending Physician prior to discharge.  Discharge destination:  Home  Is patient on multiple antipsychotic therapies at discharge:  No   Has Patient had three or more failed trials of antipsychotic monotherapy by history:  No  Recommended Plan for Multiple Antipsychotic Therapies: NA  Discharge Instructions     Diet - low sodium heart healthy   Complete by: As directed    Increase activity slowly   Complete by: As directed       Allergies as of 11/14/2023       Reactions   Bee Venom Anaphylaxis   Pork Allergy Anaphylaxis   Tramadol Rash, Other (See Comments)   Pt has Crohn's  Disease - Tramadol makes her stomach hurt/cramps   Cinnamon Itching, Other (See Comments)   Makes mouth burn and itch   Nsaids Other (See Comments)   Avoid due to Crohn's disease  Topamax  [topiramate ] Other (See Comments)   Doesn't agree with me   Wellbutrin [bupropion] Other (See Comments)   Doesn't agree with me   Lithium Rash        Medication  List     TAKE these medications      Indication  albuterol  108 (90 Base) MCG/ACT inhaler Commonly known as: VENTOLIN  HFA Inhale 2 puffs into the lungs every 6 (six) hours as needed for wheezing or shortness of breath.  Indication: Spasm of Lung Air Passages   cholestyramine  light 4 g packet Commonly known as: PREVALITE  Take 0.5 packets (2 g total) by mouth 2 (two) times daily.  Indication: Diarrhea due to Bile Acids   clonazePAM  0.5 MG tablet Commonly known as: KLONOPIN  Take 1 tablet (0.5 mg total) by mouth 3 (three) times daily. What changed:  when to take this reasons to take this  Indication: Feeling Anxious   desvenlafaxine  50 MG 24 hr tablet Commonly known as: Pristiq  Take 1 tablet (50 mg total) by mouth daily.  Indication: Bipolar 2 disorder   Dialyvite Vitamin D3 Max 1.25 MG (50000 UT) Tabs Generic drug: Cholecalciferol  TAKE 1 TABLET BY MOUTH ONCE A WEEK What changed:  how much to take when to take this  Indication: Vit D def   lamoTRIgine  150 MG tablet Commonly known as: LAMICTAL  Take 1 tablet (150 mg total) by mouth daily. Start taking on: November 15, 2023 What changed:  medication strength how much to take  Indication: Manic-Depression   lidocaine  5 % Commonly known as: LIDODERM  Place 1 patch onto the skin daily as needed (For back pain). Remove & Discard patch within 12 hours or as directed by MD  Indication: back pain   lurasidone  40 MG Tabs tablet Commonly known as: LATUDA  Take 1 tablet (40 mg total) by mouth daily with supper.  Indication: Depressive Phase of Manic-Depression   ondansetron  4 MG disintegrating tablet Commonly known as: ZOFRAN -ODT Take 1 tablet (4 mg total) by mouth every 8 (eight) hours as needed for nausea or vomiting.  Indication: Nausea and Vomiting   OVER THE COUNTER MEDICATION Take 1 capsule by mouth in the morning and at bedtime. Myo-Inositol Supplement  Indication: pain   pantoprazole  40 MG tablet Commonly known  as: PROTONIX  Take 1 tablet (40 mg total) by mouth daily.  Indication: Gastroesophageal Reflux Disease   ramelteon  8 MG tablet Commonly known as: ROZEREM  Take 1 tablet (8 mg total) by mouth at bedtime.  Indication: Trouble Sleeping   traZODone  100 MG tablet Commonly known as: DESYREL  Take 1 tablet (100 mg total) by mouth at bedtime as needed for sleep. What changed:  how much to take when to take this reasons to take this  Indication: Trouble Sleeping        Follow-up Information     [facility] Go on 12/06/2023.   Why: You have an appointment for medication management services on 12/06/23 at 5:00 pm.  The appointment will be held in person, but you may call to switch to Virtual. Contact information: [address] [place] [address] [phone]         [facility] Schedule an appointment as soon as possible for a visit.   Specialty: Psychology Why: A referral has been made to this provider.  Please call to personally schedule an appointment for trauma focused therapy services. Contact information: [address] [place] [address] [place] [address] [phone]  Follow-up recommendations:   Activity: as tolerated   Diet: heart healthy   Other: -Follow-up with your outpatient psychiatric provider -instructions on appointment date, time, and address (location) are provided to you in discharge paperwork.   -Take your psychiatric medications as prescribed at discharge - instructions are provided to you in the discharge paperwork   -Follow-up with outpatient primary care doctor and other specialists -for management of preventative medicine and chronic medical disease   -Testing: Follow-up with outpatient provider for abnormal lab results:  Lipid Panel  Labs (Brief)          Component Value Date/Time    CHOL 242 (H) 11/10/2023 2305    CHOL 235 (H) 08/16/2023 0941    TRIG 247 (H) 11/10/2023 2305     HDL 42 11/10/2023 2305    HDL 42 08/16/2023 0941    CHOLHDL 5.8 11/10/2023 2305    VLDL 49 (H) 11/10/2023 2305    LDLCALC 151 (H) 11/10/2023 2305    LDLCALC 154 (H) 08/16/2023 0941    LABVLDL 39 08/16/2023 0941        -If you are prescribed an atypical antipsychotic medication, we recommend that your outpatient psychiatrist follow routine screening for side effects within 3 months of discharge, including monitoring: AIMS scale, height, weight, blood pressure, fasting lipid panel, HbA1c, and fasting blood sugar.    -Recommend total abstinence from alcohol, tobacco, and other illicit drug use at discharge.    -If your psychiatric symptoms recur, worsen, or if you have side effects to your psychiatric medications, call your outpatient psychiatric provider, 911, [phone] or go to the nearest emergency department.   -If suicidal thoughts occur, immediately call your outpatient psychiatric provider, 911, [phone] or go to the nearest emergency department.    Signed: PGY-4 Reggie KATHEE Rice, MD 11/14/2023, 9:33 AM

## 2023-11-14 NOTE — Progress Notes (Signed)
   11/13/23 2030  Psych Admission Type (Psych Patients Only)  Admission Status Voluntary/72 hour document signed  Date 72 hour document signed  11/11/23  Time 72 hour document signed  1245  Psychosocial Assessment  Patient Complaints Malaise  Eye Contact Fair  Facial Expression Flat  Affect Flat  Speech Logical/coherent  Interaction Assertive  Motor Activity Fidgety  Appearance/Hygiene Unremarkable  Behavior Characteristics Cooperative;Restless  Mood Other (Comment);Euthymic  Thought Process  Coherency WDL  Content Other (Comment);WDL (focused on rest)  Delusions None reported or observed  Perception WDL  Hallucination None reported or observed  Judgment Impaired  Confusion None  Danger to Self  Current suicidal ideation? Denies  Self-Injurious Behavior No self-injurious ideation or behavior indicators observed or expressed   Agreement Not to Harm Self Yes  Description of Agreement Verbal  Danger to Others  Danger to Others None reported or observed

## 2023-11-14 NOTE — Progress Notes (Signed)
  [facility] Adult Case Management Discharge Plan :  Will you be returning to the same living situation after discharge:  Yes,    At discharge, do you have transportation home?: Yes,  [name] boyfriend [phone] (lives with boyfriend and grandmother) will p/u patient at 89  Do you have the ability to pay for your medications: Yes,  [employer] [facility] [place] PREFERRED  Release of information consent forms completed and in the chart;  Patient's signature needed at discharge.  Patient to Follow up at:  Follow-up Information     [facility] Go on 12/06/2023.   Why: You have an appointment for medication management services on 12/06/23 at 5:00 pm.  The appointment will be held in person, but you may call to switch to Virtual. Contact information: [address] [place] [address] [phone]         [facility] Schedule an appointment as soon as possible for a visit.   Specialty: Psychology Why: A referral has been made to this provider.  Please call to personally schedule an appointment for trauma focused therapy services. Contact information: [address] [place] [address] [phone]                 Next level of care provider has access to [facility] Link:no  Safety Planning and Suicide Prevention discussed: Pt able to contract for safety. Pt denied SI/HI/AVH  SPE completed w/ [name] boyfriend [phone] (lives with boyfriend and grandmother)  Has patient been referred to the Quitline?: Patient does not use tobacco/nicotine products  Patient has been referred for addiction treatment: Patient refused referral for treatment.  Golda Louder, LCSWA 11/14/2023, 9:17 AM

## 2023-11-15 NOTE — [facility] Group Notes (Signed)

## 2024-01-04 ENCOUNTER — Encounter (HOSPITAL_COMMUNITY): Payer: Self-pay

## 2024-01-04 ENCOUNTER — Ambulatory Visit (HOSPITAL_COMMUNITY)
Admission: EM | Admit: 2024-01-04 | Discharge: 2024-01-04 | Disposition: A | Discharge status: Home / Self Care | Attending: Emergency Medicine | Admitting: Emergency Medicine

## 2024-01-04 DIAGNOSIS — R42 Dizziness and giddiness: Secondary | ICD-10-CM | POA: Diagnosis not present

## 2024-01-04 LAB — POCT URINALYSIS DIP (MANUAL ENTRY)
Bilirubin, UA: NEGATIVE
Glucose, UA: NEGATIVE mg/dL
Ketones, POC UA: NEGATIVE mg/dL
Leukocytes, UA: NEGATIVE
Nitrite, UA: NEGATIVE
Protein Ur, POC: NEGATIVE mg/dL
Spec Grav, UA: 1.025 (ref 1.010–1.025)
Urobilinogen, UA: 0.2 U/dL
pH, UA: 5.5 (ref 5.0–8.0)

## 2024-01-04 LAB — POCT FASTING CBG KUC MANUAL ENTRY: POCT Glucose (KUC): 120 mg/dL — AB (ref 70–99)

## 2024-01-04 MED ORDER — MECLIZINE HCL 25 MG PO TABS: 25.0000 mg | ORAL_TABLET | Freq: Three times a day (TID) | ORAL | 0 refills | Status: DC | PRN

## 2024-01-04 NOTE — Discharge Instructions (Addendum)
 Your symptoms are suspicious for vertigo, use the meclizine as needed. Take position changes slowly and ensure you are staying hydrated with at least 64 ounces of water daily. You are on multiple medications that increase your risk for central nervous system depression which can lead to drowsiness, confusion, poor coordination, low blood pressure and loss of consciousness.  These medications could be potentially causing your dizziness, please follow-up with your provider if these symptoms persist.

## 2024-01-04 NOTE — ED Triage Notes (Signed)
 Chief Complaint: Patient states she feels like she is constantly on the edge of passing out. Off balance and dizzy. States no matter what she eats or drinks she will start to fel off. No diabetic history.   Sick exposure: No  Onset: on and off for 2-3 weeks but straight for the last 2 days.   Prescriptions or OTC medications tried: No    New foods, medications, or products: Yes- new anti-depressant about 1.5 months ago.   Recent Travel: No

## 2024-01-04 NOTE — ED Provider Notes (Signed)
 [facility]    CSN: [id number] Arrival date & time: 01/04/24  1122      History   Chief Complaint Chief Complaint  Patient presents with   Dizziness    HPI Barbara Orozco is a 38 y.o. female.   Patient presents to clinic over concerns of ongoing dizziness.  This has been persistent for the past few weeks but has gotten constant over the past 2 days.  Yesterday she did have an episode of emesis.  She has diarrhea at baseline, this has been ongoing.  She did start a new antidepressant about a month and a half ago.  She has been to her provider about this issue, and they suggested that if the dizziness recurs to come to urgent care.  The dizziness gets worse in the morning with eating.  Has not had any syncope.  Denies chest pain or shortness of breath.  No history of type 2 diabetes.  The history is provided by the patient and medical records.  Dizziness   Past Medical History:  Diagnosis Date   Acute posttraumatic stress disorder    ADHD (attention deficit hyperactivity disorder), combined type    Allergy    Anal fistula    Arm fracture    right and left   Asthma    Bipolar 2 disorder (HCC)    Borderline personality disorder (HCC) 05/23/2022   Cannabis use disorder    Cocaine use disorder, moderate, in early remission (HCC)    Complication of anesthesia    "wakes up angry for about 5 minutes"   Depression    Endometriosis    Foot pain    right foot hurts since last night no reported injury per pt on 01-25-2022   GERD (gastroesophageal reflux disease)    Grief associated with loss of fetus    History of 2019 novel coronavirus disease (COVID-19) 04/29/2020   + Ag test - can see in Care everywhere   History of chicken pox    had 4 or 5 times as child   History of COVID-19 04/29/2020   mild all symptoms resolved   IBS (irritable bowel syndrome)    Irritable bowel    Leg fracture, left    Psychogenic nonepileptic seizure    last seizure 1 month ago per pt on  01-25-2022 saw dr Yvone Neu neurology   Seizure [facility])    last seizure 12-28-2021 per pt on 01-25-2022   Self-injurious behavior    Shingles    intermittent outbreaks   Ulcerative colitis (HCC)    Wears glasses     Patient Active Problem List   Diagnosis Date Noted   Bipolar disorder (HCC) 11/12/2023   PMDD (premenstrual dysphoric disorder) 11/12/2023   Dysmenorrhea 09/09/2023   Pelvic pain 09/09/2023   Infertility, female 09/09/2023   RUQ abdominal pain 08/16/2023   Severe bipolar I disorder, current or most recent episode depressed (HCC) 07/13/2023   Severe episode of recurrent major depressive disorder, without psychotic features (HCC) 07/12/2023   Borderline personality disorder (HCC) 05/23/2022   GERD (gastroesophageal reflux disease)    IBS (irritable bowel syndrome)    Psychogenic nonepileptic seizure    Endometriosis    Suicide attempt (HCC)    Self-injurious behavior    Cannabis use disorder    Grief associated with loss of fetus    Acute posttraumatic stress disorder    LUQ pain    Diarrhea    Family history of diabetes mellitus 01/22/2020   History of rectal  abscess 01/22/2020   History of stomach ulcers 01/22/2020   Morbid obesity (HCC) 01/22/2020   Mild intermittent asthma without complication 01/22/2020    Past Surgical History:  Procedure Laterality Date   APPENDECTOMY     BIOPSY  06/06/2020   Procedure: BIOPSY;  Surgeon: Iva Boop, MD;  Location: [facility];  Service: Endoscopy;;   COLONOSCOPY WITH PROPOFOL N/A 06/06/2020   Procedure: COLONOSCOPY WITH PROPOFOL;  Surgeon: Iva Boop, MD;  Location: [facility];  Service: Endoscopy;  Laterality: N/A;   DIAGNOSTIC LAPAROSCOPY WITH REMOVAL OF ECTOPIC PREGNANCY Right 12/11/2021   Procedure: LAPAROSCOPIC RIGHT SALPINGECTOMY WITH REMOVAL OF ECTOPIC PREGNANCY, lysis of adhesions;  Surgeon: Noland Fordyce, MD;  Location: [facility];  Service: Gynecology;  Laterality: Right;   ESOPHAGOGASTRODUODENOSCOPY  (EGD) WITH PROPOFOL N/A 06/06/2020   Procedure: ESOPHAGOGASTRODUODENOSCOPY (EGD) WITH PROPOFOL;  Surgeon: Iva Boop, MD;  Location: [facility];  Service: Endoscopy;  Laterality: N/A;   HAND SURGERY Left    x3   LIGATION OF INTERNAL FISTULA TRACT N/A 03/29/2023   Procedure: LIGATION OF INTERNAL FISTULA TRACT;  Surgeon: Romie Levee, MD;  Location: [facility];  Service: General;  Laterality: N/A;   PLACEMENT OF SETON N/A 01/30/2022   Procedure: PLACEMENT OF SETON;  Surgeon: Romie Levee, MD;  Location: [facility];  Service: General;  Laterality: N/A;   RECTAL EXAM UNDER ANESTHESIA N/A 01/30/2022   Procedure: ANAL EXAM UNDER ANESTHESIA;  Surgeon: Romie Levee, MD;  Location: [facility];  Service: General;  Laterality: N/A;    OB History     Gravida  2   Para  0   Term  0   Preterm  0   AB  2   Living  0      SAB  0   IAB      Ectopic  2   Multiple      Live Births  0            Home Medications    Prior to Admission medications   Medication Sig Start Date End Date Taking? Authorizing Provider  cholestyramine light (PREVALITE) 4 g packet Take 0.5 packets (2 g total) by mouth 2 (two) times daily. 09/20/23  Yes Alyson Reedy, FNP  clonazePAM (KLONOPIN) 0.5 MG tablet Take 1 tablet (0.5 mg total) by mouth 3 (three) times daily. Patient taking differently: Take 0.5 mg by mouth 3 (three) times daily as needed for anxiety. 07/19/23  Yes Sarina Ill, DO  desvenlafaxine (PRISTIQ) 50 MG 24 hr tablet Take 1 tablet (50 mg total) by mouth daily. 11/14/23  Yes Bobbye Morton, MD  DIALYVITE VITAMIN D3 MAX 1.25 MG (50000 UT) TABS TAKE 1 TABLET BY MOUTH ONCE A WEEK Patient taking differently: Take 50,000 Units by mouth every Friday. 09/20/23  Yes Lo, Toma Aran, CNM  lamoTRIgine (LAMICTAL) 150 MG tablet Take 1 tablet (150 mg total) by mouth daily. 11/15/23  Yes Bobbye Morton, MD  lidocaine (LIDODERM) 5 % Place 1 patch onto the skin daily as needed (For back pain).  Remove & Discard patch within 12 hours or as directed by MD   Yes [provider]  lurasidone (LATUDA) 40 MG TABS tablet Take 1 tablet (40 mg total) by mouth daily with supper. 11/14/23  Yes Bobbye Morton, MD  meclizine (ANTIVERT) 25 MG tablet Take 1 tablet (25 mg total) by mouth 3 (three) times daily as needed for dizziness. 01/04/24  Yes Rinaldo Ratel, [place] N, FNP  ondansetron (ZOFRAN-ODT) 4  MG disintegrating tablet Take 1 tablet (4 mg total) by mouth every 8 (eight) hours as needed for nausea or vomiting. 01/15/23  Yes Iva Boop, MD  pantoprazole (PROTONIX) 40 MG tablet Take 1 tablet (40 mg total) by mouth daily. 11/12/23  Yes Margaretmary Dys, MD  ramelteon (ROZEREM) 8 MG tablet Take 1 tablet (8 mg total) by mouth at bedtime. 11/14/23  Yes Bobbye Morton, MD  traZODone (DESYREL) 100 MG tablet Take 1 tablet (100 mg total) by mouth at bedtime as needed for sleep. 11/14/23  Yes Bobbye Morton, MD    Family History Family History  Problem Relation Age of Onset   Colitis Mother    Ulcerative colitis Mother    Heart disease Mother    Endocrine tumor Mother        pituitary tumor   Colitis Father    Ulcerative colitis Father    Diabetes Father    Endocrine tumor Maternal Grandmother        piturtary tumor   Colitis Paternal Grandmother    Ulcerative colitis Paternal Grandmother    Stomach cancer Paternal Aunt    Colon cancer Neg Hx    Pancreatic cancer Neg Hx    Esophageal cancer Neg Hx     Social History Social History   Tobacco Use   Smoking status: Former    Types: Cigarettes   Smokeless tobacco: Never   Tobacco comments:    Social smoker quit 6-8 yrs ago per pt on 01-25-2022  Vaping Use   Vaping status: Never Used  Substance Use Topics   Alcohol use: Yes    Alcohol/week: 6.0 standard drinks of alcohol    Types: 2 Glasses of wine, 2 Cans of beer, 2 Shots of liquor per week   Drug use: Yes    Types: Marijuana     Allergies   Bee venom, Pork  allergy, Tramadol, Cinnamon, Nsaids, Topamax [topiramate], Wellbutrin [bupropion], and Lithium   Review of Systems Review of Systems  Per HPI   Physical Exam Triage Vital Signs ED Triage Vitals  Encounter Vitals Group     BP 01/04/24 1134 105/70     Systolic BP Percentile --      Diastolic BP Percentile --      Pulse Rate 01/04/24 1134 76     Resp 01/04/24 1134 16     Temp 01/04/24 1134 98.1 F (36.7 C)     Temp Source 01/04/24 1134 Oral     SpO2 01/04/24 1134 97 %     Weight 01/04/24 1133 280 lb (127 kg)     Height 01/04/24 1133 5\' 7"  (1.702 m)     Head Circumference --      Peak Flow --      Pain Score 01/04/24 1129 0     Pain Loc --      Pain Education --      Exclude from Growth Chart --    Orthostatic VS for the past 24 hrs:  BP- Lying Pulse- Lying BP- Sitting Pulse- Sitting BP- Standing at 0 minutes Pulse- Standing at 0 minutes  01/04/24 1136 105/70 76 110/78 79 109/68 85    Updated Vital Signs BP 105/70 (BP Location: Right Arm)   Pulse 76   Temp 98.1 F (36.7 C) (Oral)   Resp 16   Ht 5\' 7"  (1.702 m)   Wt 280 lb (127 kg)   LMP 12/28/2023 (Approximate)   SpO2 97%   BMI 43.85 kg/m  Visual Acuity Right Eye Distance:   Left Eye Distance:   Bilateral Distance:    Right Eye Near:   Left Eye Near:    Bilateral Near:     Physical Exam Vitals and nursing note reviewed.  Constitutional:      Appearance: Normal appearance.  HENT:     Head: Normocephalic and atraumatic.     Right Ear: External ear normal.     Left Ear: External ear normal.     Nose: Nose normal.     Mouth/Throat:     Mouth: Mucous membranes are moist.  Eyes:     Extraocular Movements:     Right eye: Nystagmus present.     Left eye: Nystagmus present.     Conjunctiva/sclera: Conjunctivae normal.     Pupils: Pupils are equal, round, and reactive to light.     Comments: Sensation of dizziness increased with extraocular movements and head movement.  Cardiovascular:     Rate and  Rhythm: Normal rate and regular rhythm.     Heart sounds: Normal heart sounds. No murmur heard. Pulmonary:     Effort: Pulmonary effort is normal. No respiratory distress.     Breath sounds: Normal breath sounds.  Musculoskeletal:        General: Normal range of motion.  Skin:    General: Skin is warm and dry.  Neurological:     General: No focal deficit present.     Mental Status: She is alert and oriented to person, place, and time.  Psychiatric:        Mood and Affect: Mood normal.        Behavior: Behavior normal.      UC Treatments / Results  Labs (all labs ordered are listed, but only abnormal results are displayed) Labs Reviewed  POCT URINALYSIS DIP (MANUAL ENTRY) - Abnormal; Notable for the following components:      Result Value   Blood, UA small (*)    All other components within normal limits  POCT FASTING CBG [facility] MANUAL ENTRY - Abnormal; Notable for the following components:   POCT Glucose ([facility]) 120 (*)    All other components within normal limits    EKG   Radiology No results found.  Procedures Procedures (including critical care time)  Medications Ordered in UC Medications - No data to display  Initial Impression / Assessment and Plan / UC Course  I have reviewed the triage vital signs and the nursing notes.  Pertinent labs & imaging results that were available during my care of the patient were reviewed by me and considered in my medical decision making (see chart for details).  Vitals and triage reviewed, patient is hemodynamically stable.  Lungs are vesicular, heart with regular rate and rhythm.  Bilateral nystagmus.  Extraocular movements and head movement exacerbate dizziness, suspicious for vertigo.  CBG without hypo or hyperglycemia.  Urinalysis with red blood cells, otherwise unremarkable.  Negative for orthostatic hypotension.  EKG shows normal sinus rhythm at a rate of 70 bpm, without ST elevation or ST depression.  Patient is on multiple  medications that can cause CNS depression when used together.  Advised to follow-up with provider for further investigation if dizziness continues despite meclizine.  Plan of care, follow-up care return precautions given, no questions at this time.     Final Clinical Impressions(s) / UC Diagnoses   Final diagnoses:  Dizziness     Discharge Instructions      Your symptoms are suspicious for vertigo, use  the meclizine as needed. Take position changes slowly and ensure you are staying hydrated with at least 64 ounces of water daily. You are on multiple medications that increase your risk for central nervous system depression which can lead to drowsiness, confusion, poor coordination, low blood pressure and loss of consciousness.  These medications could be potentially causing your dizziness, please follow-up with your provider if these symptoms persist.      ED Prescriptions     Medication Sig Dispense Auth. Provider   meclizine (ANTIVERT) 25 MG tablet Take 1 tablet (25 mg total) by mouth 3 (three) times daily as needed for dizziness. 30 tablet Jadelin Eng, [place] N, [place]      PDMP not reviewed this encounter.   Frankye Schwegel, [place] N, [place] 01/04/24 1220

## 2024-01-05 ENCOUNTER — Other Ambulatory Visit: Payer: Self-pay

## 2024-01-05 ENCOUNTER — Emergency Department (HOSPITAL_COMMUNITY)

## 2024-01-05 ENCOUNTER — Emergency Department (HOSPITAL_COMMUNITY)
Admission: EM | Admit: 2024-01-05 | Discharge: 2024-01-05 | Disposition: A | Discharge status: Home / Self Care | Attending: Emergency Medicine | Admitting: Emergency Medicine

## 2024-01-05 DIAGNOSIS — R209 Unspecified disturbances of skin sensation: Secondary | ICD-10-CM | POA: Insufficient documentation

## 2024-01-05 DIAGNOSIS — R112 Nausea with vomiting, unspecified: Secondary | ICD-10-CM | POA: Insufficient documentation

## 2024-01-05 DIAGNOSIS — R42 Dizziness and giddiness: Secondary | ICD-10-CM | POA: Insufficient documentation

## 2024-01-05 LAB — CBC
HCT: 41.1 % (ref 36.0–46.0)
Hemoglobin: 13.3 g/dL (ref 12.0–15.0)
MCH: 28.5 pg (ref 26.0–34.0)
MCHC: 32.4 g/dL (ref 30.0–36.0)
MCV: 88 fL (ref 80.0–100.0)
Platelets: 352 10*3/uL (ref 150–400)
RBC: 4.67 MIL/uL (ref 3.87–5.11)
RDW: 13.7 % (ref 11.5–15.5)
WBC: 8.4 10*3/uL (ref 4.0–10.5)
nRBC: 0 % (ref 0.0–0.2)

## 2024-01-05 LAB — DIFFERENTIAL
Abs Immature Granulocytes: 0.02 10*3/uL (ref 0.00–0.07)
Basophils Absolute: 0.1 10*3/uL (ref 0.0–0.1)
Basophils Relative: 1 %
Eosinophils Absolute: 0.3 10*3/uL (ref 0.0–0.5)
Eosinophils Relative: 4 %
Immature Granulocytes: 0 %
Lymphocytes Relative: 37 %
Lymphs Abs: 3.1 10*3/uL (ref 0.7–4.0)
Monocytes Absolute: 0.4 10*3/uL (ref 0.1–1.0)
Monocytes Relative: 5 %
Neutro Abs: 4.5 10*3/uL (ref 1.7–7.7)
Neutrophils Relative %: 53 %

## 2024-01-05 LAB — COMPREHENSIVE METABOLIC PANEL
ALT: 16 U/L (ref 0–44)
AST: 19 U/L (ref 15–41)
Albumin: 3.6 g/dL (ref 3.5–5.0)
Alkaline Phosphatase: 82 U/L (ref 38–126)
Anion gap: 8 (ref 5–15)
BUN: 14 mg/dL (ref 6–20)
CO2: 24 mmol/L (ref 22–32)
Calcium: 8.8 mg/dL — ABNORMAL LOW (ref 8.9–10.3)
Chloride: 105 mmol/L (ref 98–111)
Creatinine, Ser: 0.7 mg/dL (ref 0.44–1.00)
GFR, Estimated: >60 mL/min (ref >60)
Glucose, Bld: 96 mg/dL (ref 70–99)
Potassium: 4.1 mmol/L (ref 3.5–5.1)
Sodium: 137 mmol/L (ref 135–145)
Total Bilirubin: 0.7 mg/dL (ref 0.0–1.2)
Total Protein: 6.8 g/dL (ref 6.5–8.1)

## 2024-01-05 LAB — HCG, SERUM, QUALITATIVE: Preg, Serum: NEGATIVE

## 2024-01-05 MED ORDER — ONDANSETRON HCL 4 MG PO TABS: 4.0000 mg | ORAL_TABLET | Freq: Four times a day (QID) | ORAL | 0 refills | Status: DC | PRN

## 2024-01-05 MED ORDER — IOHEXOL 350 MG/ML SOLN
100.0000 mL | Freq: Once | INTRAVENOUS | Status: AC | PRN
  Administered 2024-01-05: 75 mL via INTRAVENOUS

## 2024-01-05 NOTE — ED Provider Notes (Signed)
 [place] EMERGENCY DEPARTMENT AT [facility] Provider Note   CSN: [id number] Arrival date & time: 01/05/24  1018     History  Chief Complaint  Patient presents with   Dizziness    Barbara Orozco is a 38 y.o. female with medical history of BPD, acute PTSD, ADHD, bipolar disorder, cannabis use disorder, cocaine use disorder, GERD, psychogenic nonepileptic seizures.  Patient presents to ED for evaluation of lightheadedness.  The patient reports that she has had intermittent dizziness for quite a few weeks now.  States that in the last 3 days her dizziness has become constant.  She hesitates to call at dizziness and instead prefers to say that she feels very "lightheaded, like if I make a movement it will take my body and my mind a second to catch up with what I am doing".  She is having a hard time explaining how exactly she feels and she does admit to this.  She is on numerous medications to include Pristiq, Lamictal, patient recently began Jordan in the middle of January.  She states that she takes Klonopin as needed for seizures but has not taken some in 1 week.  She states that this morning she woke up with numbness in her lips, bilateral hands, bilateral feet.  She was seen at urgent care yesterday where previous provider thought the patient might have vertigo due to vertical nystagmus.  The patient reports she was given meclizine, discharged from urgent care and took her first dose but this had no effect on her symptoms.  She denies one-sided weakness or numbness.  Denies any visual deficits.  She is endorsing a headache but states that she feels that she might have a headache because she has not had anything to eat.  States whenever she eats, she throws it back up.  Denies abdominal pain, diarrhea.  Denies chest pain, shortness of breath.  Denies neck pain.   Dizziness Associated symptoms: nausea and vomiting   Associated symptoms: no diarrhea, no headaches and no weakness         Home Medications Prior to Admission medications   Medication Sig Start Date End Date Taking? Authorizing Provider  ondansetron (ZOFRAN) 4 MG tablet Take 1 tablet (4 mg total) by mouth every 6 (six) hours as needed for nausea or vomiting. 01/05/24  Yes Al Decant, PA-C  cholestyramine light (PREVALITE) 4 g packet Take 0.5 packets (2 g total) by mouth 2 (two) times daily. 09/20/23   Alyson Reedy, FNP  clonazePAM (KLONOPIN) 0.5 MG tablet Take 1 tablet (0.5 mg total) by mouth 3 (three) times daily. Patient taking differently: Take 0.5 mg by mouth 3 (three) times daily as needed for anxiety. 07/19/23   Sarina Ill, DO  desvenlafaxine (PRISTIQ) 50 MG 24 hr tablet Take 1 tablet (50 mg total) by mouth daily. 11/14/23   Bobbye Morton, MD  DIALYVITE VITAMIN D3 MAX 1.25 MG (50000 UT) TABS TAKE 1 TABLET BY MOUTH ONCE A WEEK Patient taking differently: Take 50,000 Units by mouth every Friday. 09/20/23   Lo, Toma Aran, CNM  lamoTRIgine (LAMICTAL) 150 MG tablet Take 1 tablet (150 mg total) by mouth daily. 11/15/23   Bobbye Morton, MD  lidocaine (LIDODERM) 5 % Place 1 patch onto the skin daily as needed (For back pain). Remove & Discard patch within 12 hours or as directed by MD    [provider]  lurasidone (LATUDA) 40 MG TABS tablet Take 1 tablet (40 mg total) by mouth  daily with supper. 11/14/23   Bobbye Morton, MD  meclizine (ANTIVERT) 25 MG tablet Take 1 tablet (25 mg total) by mouth 3 (three) times daily as needed for dizziness. 01/04/24   Garrison, [place] N, FNP  ondansetron (ZOFRAN-ODT) 4 MG disintegrating tablet Take 1 tablet (4 mg total) by mouth every 8 (eight) hours as needed for nausea or vomiting. 01/15/23   Iva Boop, MD  pantoprazole (PROTONIX) 40 MG tablet Take 1 tablet (40 mg total) by mouth daily. 11/12/23   Margaretmary Dys, MD  ramelteon (ROZEREM) 8 MG tablet Take 1 tablet (8 mg total) by mouth at bedtime. 11/14/23   Bobbye Morton, MD   traZODone (DESYREL) 100 MG tablet Take 1 tablet (100 mg total) by mouth at bedtime as needed for sleep. 11/14/23   Bobbye Morton, MD      Allergies    Bee venom, Pork allergy, Tramadol, Cinnamon, Nsaids, Topamax [topiramate], Wellbutrin [bupropion], and Lithium    Review of Systems   Review of Systems  Eyes:  Negative for visual disturbance.  Gastrointestinal:  Positive for nausea and vomiting. Negative for abdominal pain and diarrhea.  Musculoskeletal:  Negative for neck pain.  Neurological:  Positive for light-headedness and numbness. Negative for syncope, facial asymmetry, speech difficulty, weakness and headaches.  All other systems reviewed and are negative.   Physical Exam Updated Vital Signs BP 134/83 (BP Location: Right Arm)   Pulse 72   Temp 98.4 F (36.9 C) (Oral)   Resp 18   Ht 5\' 7"  (1.702 m)   Wt 127 kg   LMP 12/28/2023 (Approximate)   SpO2 99%   BMI 43.85 kg/m  Physical Exam Vitals and nursing note reviewed.  Constitutional:      General: She is not in acute distress.    Appearance: She is well-developed.  HENT:     Head: Normocephalic and atraumatic.  Eyes:     Conjunctiva/sclera: Conjunctivae normal.  Cardiovascular:     Rate and Rhythm: Normal rate and regular rhythm.     Heart sounds: No murmur heard. Pulmonary:     Effort: Pulmonary effort is normal. No respiratory distress.     Breath sounds: Normal breath sounds.  Abdominal:     Palpations: Abdomen is soft.     Tenderness: There is no abdominal tenderness.  Musculoskeletal:        General: No swelling.     Cervical back: Neck supple.  Skin:    General: Skin is warm and dry.     Capillary Refill: Capillary refill takes less than 2 seconds.  Neurological:     General: No focal deficit present.     Mental Status: She is alert.     GCS: GCS eye subscore is 4. GCS verbal subscore is 5. GCS motor subscore is 6.     Cranial Nerves: Cranial nerves 2-12 are intact. No cranial nerve deficit.      Sensory: Sensation is intact. No sensory deficit.     Motor: Motor function is intact. No weakness.     Coordination: Coordination is intact. Heel to [facility] Test normal.     Comments: CN III through XII intact.  Intact finger-nose, heel-to-shin.  No pronator drift, no slurred speech, no facial droop.  5 out of 5 strength bilateral lower extremities.  5 out of 5 strength bilateral upper extremities.  Patient denies any subjective numbness on exam.  Psychiatric:        Mood and Affect: Mood normal.  ED Results / Procedures / Treatments   Labs (all labs ordered are listed, but only abnormal results are displayed) Labs Reviewed  COMPREHENSIVE METABOLIC PANEL - Abnormal; Notable for the following components:      Result Value   Calcium 8.8 (*)    All other components within normal limits  CBC  DIFFERENTIAL  HCG, SERUM, QUALITATIVE    EKG None  Radiology CT ANGIO HEAD NECK W WO CM Result Date: 01/05/2024 CLINICAL DATA:  Central vertigo. EXAM: CT ANGIOGRAPHY HEAD AND NECK WITH AND WITHOUT CONTRAST TECHNIQUE: Multidetector CT imaging of the head and neck was performed using the standard protocol during bolus administration of intravenous contrast. Multiplanar CT image reconstructions and MIPs were obtained to evaluate the vascular anatomy. Carotid stenosis measurements (when applicable) are obtained utilizing NASCET criteria, using the distal internal carotid diameter as the denominator. RADIATION DOSE REDUCTION: This exam was performed according to the departmental dose-optimization program which includes automated exposure control, adjustment of the mA and/or kV according to patient size and/or use of iterative reconstruction technique. CONTRAST:  75mL OMNIPAQUE IOHEXOL 350 MG/ML SOLN COMPARISON:  MR head without and with contrast 07/06/2020. FINDINGS: CT HEAD FINDINGS Brain: No acute infarct, hemorrhage, or mass lesion is present. No significant white matter lesions are present. Deep brain  nuclei are within normal limits. The ventricles are of normal size. No significant extraaxial fluid collection is present. The brainstem and cerebellum are within normal limits. Midline structures are within normal limits. Vascular: No hyperdense vessel or unexpected calcification. Skull: Calvarium is intact. No focal lytic or blastic lesions are present. No significant extracranial soft tissue lesion is present. Sinuses/Orbits: The paranasal sinuses and mastoid air cells are clear. The globes and orbits are within normal limits. Review of the MIP images confirms the above findings CTA NECK FINDINGS Aortic arch: Common origin of left common carotid artery and innominate artery is noted. No significant atherosclerotic changes are present at the aortic arch. No aneurysm or dissection is present. Right carotid system: The right common carotid artery is within normal limits. Bifurcation is unremarkable. Tortuosity is present in the high cervical right ICA, just below the skull base. Left carotid system: Left common carotid artery is within normal limits. Bifurcation is unremarkable. The cervical left ICA is normal. Vertebral arteries: The left vertebral artery is slightly dominant. Both vertebral arteries originate from the subclavian arteries without significant stenosis. No significant stenosis is present in either vertebral artery in the neck. Skeleton: The vertebral body heights and alignment are normal. No focal osseous lesions are present. Mild straightening of the normal cervical lordosis may be positional. Other neck: The soft tissues of the neck are otherwise unremarkable. Salivary glands are within normal limits. Thyroid is normal. No significant adenopathy is present. No focal mucosal or submucosal lesions are present. Upper chest: The lung apices are clear. The thoracic inlet is within normal limits. Review of the MIP images confirms the above findings CTA HEAD FINDINGS Anterior circulation: Internal carotid  arteries are within normal limits from the skull base to the ICA termini. The A1 and M1 segments are normal. The anterior communicating artery is patent. The MCA bifurcations are within normal limits bilaterally. The ACA and MCA branch vessels are normal. No aneurysm is present. Posterior circulation: The PICA origins are visualized and normal. Vertebrobasilar junction and basilar artery normal. The superior cerebellar artery is patent bilaterally. Both posterior cerebral arteries originate basilar tip. The PCA branch vessels are normal bilaterally. Venous sinuses: The dural sinuses are patent.  The straight sinus and deep cerebral veins are intact. Cortical veins are within normal limits. No significant vascular malformation is evident. Anatomic variants: None Review of the MIP images confirms the above findings IMPRESSION: 1. Normal noncontrast CT of the head. 2. Normal CTA of the neck. 3. Normal CTA Circle of Willis without significant proximal stenosis, aneurysm, or branch vessel occlusion. Electronically Signed   By: Marin Roberts M.D.   On: 01/05/2024 13:28    Procedures Procedures   Medications Ordered in ED Medications  iohexol (OMNIPAQUE) 350 MG/ML injection 100 mL (75 mLs Intravenous Contrast Given 01/05/24 1258)    ED Course/ Medical Decision Making/ A&P   Medical Decision Making Amount and/or Complexity of Data Reviewed Labs: ordered. Radiology: ordered.  Risk Prescription drug management.   38 year old female presents for evaluation.  Please see HPI for further details.  On examination the patient is afebrile and nontachycardic.  Her lung sounds are clear bilaterally, she is not hypoxic.  Abdomen is soft and compressible throughout.  Neurological examinations at baseline without focal neurodeficits.  5 out of 5 strength bilateral upper extremities.  5 out of 5 strength bilateral lower extremities.  No subjective numbness noted on exam.  CN III through XII intact.  No pronator  drift or slurred speech.  Suspect the numerous psychiatric drugs the patient is currently on could be responsible for her symptoms.  Will assess with CT angiogram head and neck and labs.  CBC without leukocytosis or anemia.  Metabolic panel without electrolyte derangement.  Patient unable to provide urine sample.  CT angiogram head and neck unremarkable without evidence of large vessel occlusion or other intracranial abnormality.  At this time will refer patient to neurology for further workup and management.  The patient was offered MRI of her brain but she is deferring on this at this time.    Will send patient home with Zofran for nausea and vomiting.  Have given patient return precautions and she voiced understanding.  Will have her follow-up with psychiatry as well.  Stable to discharge home.  Final Clinical Impression(s) / ED Diagnoses Final diagnoses:  Lightheadedness  Dizziness    Rx / DC Orders ED Discharge Orders          Ordered    ondansetron (ZOFRAN) 4 MG tablet  Every 6 hours PRN        01/05/24 1427    Ambulatory referral to Neurology       Comments: An appointment is requested in approximately: 1 week   01/05/24 1427              Al Decant, PA-C 01/05/24 1428    Benjiman Core, MD 01/05/24 [phone]

## 2024-01-05 NOTE — Discharge Instructions (Signed)
 Was pleasure taking part in your care.  As we discussed, imaging studies done here today as well as lab work are reassuring.  Your symptoms could be secondary to psychiatric medications.  Please follow-up with psychiatry as well as neurology.  I have placed an ambulatory referral to neurology so they should be contacting you shortly.  Please return to the ED with any new symptoms such as one-sided weakness or numbness.  You may take Zofran every 6 hours as needed for nausea and vomiting.

## 2024-01-05 NOTE — ED Triage Notes (Signed)
 Pt comes via POV, ambulatory, presents for continued lightheaded and dizziness on going for 2 weeks, c/o now feeling of numbness is fingers and toes, has equal sensation and grip strength. States "I just don't feel right in my head and when I walk its like I'm glitching, like my body is not keeping up with my mind"

## 2024-01-24 ENCOUNTER — Other Ambulatory Visit: Payer: Self-pay | Admitting: Internal Medicine

## 2024-02-24 ENCOUNTER — Ambulatory Visit (INDEPENDENT_AMBULATORY_CARE_PROVIDER_SITE_OTHER): Admitting: Podiatry

## 2024-02-24 ENCOUNTER — Ambulatory Visit (INDEPENDENT_AMBULATORY_CARE_PROVIDER_SITE_OTHER)

## 2024-02-24 DIAGNOSIS — M19072 Primary osteoarthritis, left ankle and foot: Secondary | ICD-10-CM

## 2024-02-24 DIAGNOSIS — M19071 Primary osteoarthritis, right ankle and foot: Secondary | ICD-10-CM

## 2024-02-24 DIAGNOSIS — M792 Neuralgia and neuritis, unspecified: Secondary | ICD-10-CM

## 2024-02-24 MED ORDER — TRIAMCINOLONE ACETONIDE 10 MG/ML IJ SUSP
2.5000 mg | Freq: Once | INTRAMUSCULAR | Status: AC
  Administered 2024-02-24: 2.5 mg via INTRA_ARTICULAR

## 2024-02-24 MED ORDER — DEXAMETHASONE SODIUM PHOSPHATE 120 MG/30ML IJ SOLN
4.0000 mg | Freq: Once | INTRAMUSCULAR | Status: AC
  Administered 2024-02-24: 4 mg via INTRA_ARTICULAR

## 2024-02-24 NOTE — Progress Notes (Signed)
 Subjective:  Patient ID: Barbara Orozco, female    DOB: [birth year],   MRN: [id number]  Chief Complaint  Patient presents with   Foot Pain    Pt stated that she was told she had bone spurs in her feet. She stated that she has discomfort on the top of her foot     38 y.o. female presents for concern of bilateral foot pain but mostly the left foot. Relates most of the pain is on the top of the foot and painful and aching when walking. Also relates pain on the outside of her left foot that mostly hurts when sitting. She relates burning tingling and shooting pains in this area. Has tried voltaren  and switching shoes and tylenol  without much relief.  . Denies any other pedal complaints. Denies n/v/f/c.   Past Medical History:  Diagnosis Date   Acute posttraumatic stress disorder    ADHD (attention deficit hyperactivity disorder), combined type    Allergy    Anal fistula    Arm fracture    right and left   Asthma    Bipolar 2 disorder (HCC)    Borderline personality disorder (HCC) 05/23/2022   Cannabis use disorder    Cocaine use disorder, moderate, in early remission (HCC)    Complication of anesthesia    "wakes up angry for about 5 minutes"   Depression    Endometriosis    Foot pain    right foot hurts since last night no reported injury per pt on 01-25-2022   GERD (gastroesophageal reflux disease)    Grief associated with loss of fetus    History of 2019 novel coronavirus disease (COVID-19) 04/29/2020   + Ag test - can see in Care everywhere   History of chicken pox    had 4 or 5 times as child   History of COVID-19 04/29/2020   mild all symptoms resolved   IBS (irritable bowel syndrome)    Irritable bowel    Leg fracture, left    Psychogenic nonepileptic seizure    last seizure 1 month ago per pt on 01-25-2022 saw dr Almon [facility] neurology   Seizure [facility])    last seizure 12-28-2021 per pt on 01-25-2022   Self-injurious behavior    Shingles    intermittent outbreaks   Ulcerative  colitis (HCC)    Wears glasses     de Objective:  Physical Exam: Vascular: DP/PT pulses 2/4 bilateral. CFT <3 seconds. Normal hair growth on digits. No edema.  Skin. No lacerations or abrasions bilateral feet.  Musculoskeletal: MMT 5/5 bilateral lower extremities in DF, PF, Inversion and Eversion. Deceased ROM in DF of ankle joint. Tender over second TMTJ mostly on the left and some on the right. Pain with ROM of the digit. Tenderness and sensitivity noted along fifth metatarsal area as well  Neurological: Sensation intact to light touch.   Assessment:   1. Arthritis of both midfeet   2. Neuritis      Plan:  Patient was evaluated and treated and all questions answered. Discussed midfoot arthritis with patient and treatment options.  X-rays reviewed. No acute fractures or dislocations. Degenerative changes noted throughout midfoot.  Discussed NSAIDS, topicals, and possible injections.  Unable to take NSAIds. Will stikc with tylenol .  Injection offered today procedure below.  Discussed stiff soled shoes and carbon fiber foot plate.  Discussed if pain does not improve can discuss surgical options.  Patient to follow-up in 6 weeks for rehceck   Procedure: Injection Tendon/Ligament Discussed alternatives,  risks, complications and verbal consent was obtained.  Location: Bilateral second tarsometatarsal joint. Skin Prep: Alcohol. Injectate: 1cc 0.5% marcaine  plain, 1 cc dexamethasone  0.5 cc kenalog   Disposition: Patient tolerated procedure well. Injection site dressed with a band-aid.  Post-injection care was discussed and return precautions discussed.     Jennefer Moats, DPM

## 2024-02-28 ENCOUNTER — Ambulatory Visit: Admitting: Family Medicine

## 2024-04-06 ENCOUNTER — Ambulatory Visit (INDEPENDENT_AMBULATORY_CARE_PROVIDER_SITE_OTHER): Admitting: Podiatry

## 2024-04-06 DIAGNOSIS — Z91199 Patient's noncompliance with other medical treatment and regimen due to unspecified reason: Secondary | ICD-10-CM

## 2024-04-06 NOTE — Progress Notes (Signed)
 No show

## 2024-04-28 ENCOUNTER — Other Ambulatory Visit: Payer: Self-pay

## 2024-04-28 ENCOUNTER — Emergency Department
Admission: EM | Admit: 2024-04-28 | Discharge: 2024-04-29 | Disposition: A | Discharge status: Other Acute Inpatient Hospital | Attending: Emergency Medicine | Admitting: Emergency Medicine

## 2024-04-28 ENCOUNTER — Encounter: Payer: Self-pay | Admitting: Emergency Medicine

## 2024-04-28 DIAGNOSIS — J45909 Unspecified asthma, uncomplicated: Secondary | ICD-10-CM | POA: Insufficient documentation

## 2024-04-28 DIAGNOSIS — F314 Bipolar disorder, current episode depressed, severe, without psychotic features: Secondary | ICD-10-CM | POA: Insufficient documentation

## 2024-04-28 DIAGNOSIS — R45851 Suicidal ideations: Secondary | ICD-10-CM | POA: Insufficient documentation

## 2024-04-28 DIAGNOSIS — F603 Borderline personality disorder: Secondary | ICD-10-CM | POA: Insufficient documentation

## 2024-04-28 DIAGNOSIS — Z8616 Personal history of COVID-19: Secondary | ICD-10-CM | POA: Diagnosis not present

## 2024-04-28 DIAGNOSIS — R4689 Other symptoms and signs involving appearance and behavior: Secondary | ICD-10-CM | POA: Diagnosis present

## 2024-04-28 LAB — CBC
HCT: 40.6 % (ref 36.0–46.0)
Hemoglobin: 13 g/dL (ref 12.0–15.0)
MCH: 27.9 pg (ref 26.0–34.0)
MCHC: 32 g/dL (ref 30.0–36.0)
MCV: 87.1 fL (ref 80.0–100.0)
Platelets: 406 10*3/uL — ABNORMAL HIGH (ref 150–400)
RBC: 4.66 MIL/uL (ref 3.87–5.11)
RDW: 13.2 % (ref 11.5–15.5)
WBC: 11.1 10*3/uL — ABNORMAL HIGH (ref 4.0–10.5)
nRBC: 0 % (ref 0.0–0.2)

## 2024-04-28 LAB — COMPREHENSIVE METABOLIC PANEL WITH GFR
ALT: 17 U/L (ref 0–44)
AST: 18 U/L (ref 15–41)
Albumin: 3.6 g/dL (ref 3.5–5.0)
Alkaline Phosphatase: 87 U/L (ref 38–126)
Anion gap: 9 (ref 5–15)
BUN: 9 mg/dL (ref 6–20)
CO2: 25 mmol/L (ref 22–32)
Calcium: 9.2 mg/dL (ref 8.9–10.3)
Chloride: 103 mmol/L (ref 98–111)
Creatinine, Ser: 0.8 mg/dL (ref 0.44–1.00)
GFR, Estimated: >60 mL/min (ref >60)
Glucose, Bld: 100 mg/dL — ABNORMAL HIGH (ref 70–99)
Potassium: 3.6 mmol/L (ref 3.5–5.1)
Sodium: 137 mmol/L (ref 135–145)
Total Bilirubin: 0.6 mg/dL (ref 0.0–1.2)
Total Protein: 7.2 g/dL (ref 6.5–8.1)

## 2024-04-28 LAB — URINE DRUG SCREEN, QUALITATIVE (ARMC ONLY)
Amphetamines, Ur Screen: NOT DETECTED
Barbiturates, Ur Screen: NOT DETECTED
Benzodiazepine, Ur Scrn: POSITIVE — AB
Cannabinoid 50 Ng, Ur ~~LOC~~: POSITIVE — AB
Cocaine Metabolite,Ur ~~LOC~~: NOT DETECTED
MDMA (Ecstasy)Ur Screen: NOT DETECTED
Methadone Scn, Ur: NOT DETECTED
Opiate, Ur Screen: NOT DETECTED
Phencyclidine (PCP) Ur S: NOT DETECTED
Tricyclic, Ur Screen: NOT DETECTED

## 2024-04-28 LAB — POC URINE PREG, ED: Preg Test, Ur: NEGATIVE

## 2024-04-28 LAB — ETHANOL: Alcohol, Ethyl (B): <15 mg/dL (ref <15)

## 2024-04-28 MED ORDER — ALUM & MAG HYDROXIDE-SIMETH 200-200-20 MG/5ML PO SUSP: 30.0000 mL | Freq: Four times a day (QID) | ORAL | Status: DC | PRN

## 2024-04-28 MED ORDER — ONDANSETRON HCL 4 MG PO TABS: 4.0000 mg | ORAL_TABLET | Freq: Three times a day (TID) | ORAL | Status: DC | PRN

## 2024-04-28 MED ORDER — LORAZEPAM 2 MG PO TABS
2.0000 mg | ORAL_TABLET | Freq: Once | ORAL | Status: AC
  Administered 2024-04-29: 2 mg via ORAL
  Filled 2024-04-28: qty 1

## 2024-04-28 MED ORDER — ACETAMINOPHEN 500 MG PO TABS
1000.0000 mg | ORAL_TABLET | Freq: Three times a day (TID) | ORAL | Status: DC | PRN
  Administered 2024-04-29 (×2): 1000 mg via ORAL
  Filled 2024-04-28 (×3): qty 2

## 2024-04-28 NOTE — ED Triage Notes (Signed)
 Pt to ED via POV for psych eval. Pt reports she has been having a hard time recently. Has been angry, banging head on wall, and having thoughts of hurting self. Denies any plan has been taking meds as prescribed and says they are not working as well,

## 2024-04-28 NOTE — ED Notes (Signed)
 Pt tearful charge RN talking with pt. Meal tray and milk given to pt.

## 2024-04-28 NOTE — ED Provider Notes (Signed)
 [facility] Provider Note    Event Date/Time   First MD Initiated Contact with Patient 04/28/24 2303     (approximate)   History   Chief Complaint: Psychiatric Evaluation   HPI  Barbara Orozco is a 38 y.o. female with a history of bipolar disorder, GERD, depression who comes ED complaining of suicidal thoughts for the past 4 days.  She feels unable to stop thinking about suicide.  Reports a history of suicide attempt by hanging.  Denies any ingestions.  Has been taking her medications as prescribed but without relief.  Also feels hopeless and overwhelmed.     Physical Exam   Triage Vital Signs: ED Triage Vitals  Encounter Vitals Group     BP 04/28/24 2051 113/87     Girls Systolic BP Percentile --      Girls Diastolic BP Percentile --      Boys Systolic BP Percentile --      Boys Diastolic BP Percentile --      Pulse Rate 04/28/24 2051 94     Resp 04/28/24 2051 20     Temp 04/28/24 2051 98.7 F (37.1 C)     Temp Source 04/28/24 2051 Oral     SpO2 04/28/24 2051 99 %     Weight 04/28/24 2052 270 lb (122.5 kg)     Height 04/28/24 2052 5' 7 (1.702 m)     Head Circumference --      Peak Flow --      Pain Score 04/28/24 2107 0     Pain Loc --      Pain Education --      Exclude from Growth Chart --     Most recent vital signs: Vitals:   04/28/24 2051  BP: 113/87  Pulse: 94  Resp: 20  Temp: 98.7 F (37.1 C)  SpO2: 99%    General: Awake, no distress. CV:  Good peripheral perfusion.  Resp:  Normal effort.  Abd:  No distention.  Other:  No wounds   ED Results / Procedures / Treatments   Labs (all labs ordered are listed, but only abnormal results are displayed) Labs Reviewed  COMPREHENSIVE METABOLIC PANEL WITH GFR - Abnormal; Notable for the following components:      Result Value   Glucose, Bld 100 (*)    All other components within normal limits  CBC - Abnormal; Notable for the following components:   WBC 11.1 (*)    Platelets  406 (*)    All other components within normal limits  URINE DRUG SCREEN, QUALITATIVE ([facility] ONLY) - Abnormal; Notable for the following components:   Cannabinoid 50 Ng, Ur Malcolm POSITIVE (*)    Benzodiazepine, Ur Scrn POSITIVE (*)    All other components within normal limits  ETHANOL  LAMOTRIGINE  LEVEL  POC URINE PREG, ED     EKG    RADIOLOGY    PROCEDURES:  Procedures   MEDICATIONS ORDERED IN ED: Medications  ondansetron  (ZOFRAN ) tablet 4 mg (has no administration in time range)  alum & mag hydroxide-simeth (MAALOX/MYLANTA) 200-200-20 MG/5ML suspension 30 mL (has no administration in time range)  acetaminophen  (TYLENOL ) tablet 1,000 mg (1,000 mg Oral Given 04/29/24 0324)  clonazePAM  (KLONOPIN ) tablet 0.5 mg (0.5 mg Oral Given 04/29/24 0325)  traZODone  (DESYREL ) tablet 100 mg (100 mg Oral Given 04/29/24 0325)  LORazepam  (ATIVAN ) tablet 2 mg (2 mg Oral Given 04/29/24 0002)     IMPRESSION / MDM / ASSESSMENT AND PLAN / ED COURSE  I reviewed the triage vital signs and the nursing notes.  Patient's presentation is most consistent with acute presentation with potential threat to life or bodily function.    The patient has been placed in psychiatric observation due to the need to provide a safe environment for the patient while obtaining psychiatric consultation and evaluation, as well as ongoing medical and medication management to treat the patient's condition.  The patient has been placed under full IVC at this time.  Clinical Course as of 04/29/24 0436  Tue Apr 28, 2024  2337 Patient presents with SI, feeling overwhelmed, hopeless, depressed mood and affect.  Has a history of suicide attempt by hanging.  Reports being unable to stop thinking about killing herself and does not feel safe.  Will IVC, consult psychiatry.  She is medically stable. [PS]    Clinical Course User Index [PS] Viviann Pastor, MD     FINAL CLINICAL IMPRESSION(S) / ED DIAGNOSES   Final diagnoses:   Bipolar disorder, current episode depressed, severe, without psychotic features (HCC)     Rx / DC Orders   ED Discharge Orders     None        Note:  This document was prepared using Dragon voice recognition software and may include unintentional dictation errors.   Viviann Pastor, MD 04/29/24 [phone]

## 2024-04-28 NOTE — ED Notes (Addendum)
 Pt dressed out in triage by this EDT and Tracey, EDT. Pt calm when dressing out.   Colorful crocs White t-shirt Blue pants Yellow panties Purple phone (fully intact)    Pt keeping sports bra on at this time due to a visit last yr pt was allowed to keep it on and sit in the hallway to be monitored 24/7. Pt stated, charge nurse let me keep it in the last time I was here.

## 2024-04-29 ENCOUNTER — Encounter: Payer: Self-pay | Admitting: Psychiatry

## 2024-04-29 ENCOUNTER — Other Ambulatory Visit: Payer: Self-pay

## 2024-04-29 ENCOUNTER — Inpatient Hospital Stay (HOSPITAL_COMMUNITY): Admission: AD | Admit: 2024-04-29 | Source: Intra-hospital | Admitting: Psychiatry

## 2024-04-29 ENCOUNTER — Inpatient Hospital Stay
Admission: AD | Admit: 2024-04-29 | Discharge: 2024-05-04 | DRG: 885 | Disposition: A | Discharge status: Home / Self Care | Source: Intra-hospital | Attending: Psychiatry | Admitting: Psychiatry

## 2024-04-29 DIAGNOSIS — Z79899 Other long term (current) drug therapy: Secondary | ICD-10-CM

## 2024-04-29 DIAGNOSIS — F1210 Cannabis abuse, uncomplicated: Secondary | ICD-10-CM | POA: Diagnosis present

## 2024-04-29 DIAGNOSIS — K5190 Ulcerative colitis, unspecified, without complications: Secondary | ICD-10-CM | POA: Diagnosis present

## 2024-04-29 DIAGNOSIS — Z634 Disappearance and death of family member: Secondary | ICD-10-CM

## 2024-04-29 DIAGNOSIS — F319 Bipolar disorder, unspecified: Secondary | ICD-10-CM

## 2024-04-29 DIAGNOSIS — Z8616 Personal history of COVID-19: Secondary | ICD-10-CM | POA: Diagnosis not present

## 2024-04-29 DIAGNOSIS — Z87891 Personal history of nicotine dependence: Secondary | ICD-10-CM

## 2024-04-29 DIAGNOSIS — K219 Gastro-esophageal reflux disease without esophagitis: Secondary | ICD-10-CM | POA: Diagnosis present

## 2024-04-29 DIAGNOSIS — Z91014 Allergy to mammalian meats: Secondary | ICD-10-CM

## 2024-04-29 DIAGNOSIS — F603 Borderline personality disorder: Secondary | ICD-10-CM

## 2024-04-29 DIAGNOSIS — Z885 Allergy status to narcotic agent status: Secondary | ICD-10-CM

## 2024-04-29 DIAGNOSIS — Z91030 Bee allergy status: Secondary | ICD-10-CM

## 2024-04-29 DIAGNOSIS — F902 Attention-deficit hyperactivity disorder, combined type: Secondary | ICD-10-CM | POA: Diagnosis present

## 2024-04-29 DIAGNOSIS — Z604 Social exclusion and rejection: Secondary | ICD-10-CM | POA: Diagnosis present

## 2024-04-29 DIAGNOSIS — Z800 Family history of malignant neoplasm of digestive organs: Secondary | ICD-10-CM | POA: Diagnosis not present

## 2024-04-29 DIAGNOSIS — F3181 Bipolar II disorder: Secondary | ICD-10-CM | POA: Diagnosis present

## 2024-04-29 DIAGNOSIS — F4311 Post-traumatic stress disorder, acute: Secondary | ICD-10-CM | POA: Diagnosis present

## 2024-04-29 DIAGNOSIS — Z8249 Family history of ischemic heart disease and other diseases of the circulatory system: Secondary | ICD-10-CM | POA: Diagnosis not present

## 2024-04-29 DIAGNOSIS — Z9151 Personal history of suicidal behavior: Secondary | ICD-10-CM

## 2024-04-29 DIAGNOSIS — F314 Bipolar disorder, current episode depressed, severe, without psychotic features: Secondary | ICD-10-CM | POA: Diagnosis not present

## 2024-04-29 DIAGNOSIS — Z886 Allergy status to analgesic agent status: Secondary | ICD-10-CM | POA: Diagnosis not present

## 2024-04-29 DIAGNOSIS — F419 Anxiety disorder, unspecified: Secondary | ICD-10-CM | POA: Diagnosis present

## 2024-04-29 DIAGNOSIS — R45851 Suicidal ideations: Secondary | ICD-10-CM | POA: Diagnosis present

## 2024-04-29 DIAGNOSIS — Z62810 Personal history of physical and sexual abuse in childhood: Secondary | ICD-10-CM

## 2024-04-29 DIAGNOSIS — F1421 Cocaine dependence, in remission: Secondary | ICD-10-CM | POA: Diagnosis present

## 2024-04-29 DIAGNOSIS — Z833 Family history of diabetes mellitus: Secondary | ICD-10-CM

## 2024-04-29 DIAGNOSIS — Z9102 Food additives allergy status: Secondary | ICD-10-CM

## 2024-04-29 DIAGNOSIS — J45909 Unspecified asthma, uncomplicated: Secondary | ICD-10-CM | POA: Diagnosis present

## 2024-04-29 MED ORDER — LAMOTRIGINE 100 MG PO TABS: 200.0000 mg | ORAL_TABLET | Freq: Every day | ORAL | Status: DC

## 2024-04-29 MED ORDER — DIPHENHYDRAMINE HCL 25 MG PO CAPS
50.0000 mg | ORAL_CAPSULE | Freq: Three times a day (TID) | ORAL | Status: DC | PRN
  Administered 2024-04-30 – 2024-05-03 (×4): 50 mg via ORAL
  Filled 2024-04-29 (×4): qty 2

## 2024-04-29 MED ORDER — DIPHENHYDRAMINE HCL 50 MG/ML IJ SOLN
50.0000 mg | Freq: Three times a day (TID) | INTRAMUSCULAR | Status: DC | PRN
  Filled 2024-04-29: qty 1

## 2024-04-29 MED ORDER — DIPHENHYDRAMINE HCL 50 MG/ML IJ SOLN
25.0000 mg | Freq: Once | INTRAMUSCULAR | Status: AC
  Administered 2024-04-29: 25 mg via INTRAMUSCULAR
  Filled 2024-04-29: qty 1

## 2024-04-29 MED ORDER — HALOPERIDOL LACTATE 5 MG/ML IJ SOLN: 5.0000 mg | Freq: Three times a day (TID) | INTRAMUSCULAR | Status: DC | PRN

## 2024-04-29 MED ORDER — TRAZODONE HCL 100 MG PO TABS
100.0000 mg | ORAL_TABLET | Freq: Every day | ORAL | Status: DC
  Administered 2024-04-30 – 2024-05-03 (×4): 100 mg via ORAL
  Filled 2024-04-29 (×4): qty 1

## 2024-04-29 MED ORDER — ALUM & MAG HYDROXIDE-SIMETH 200-200-20 MG/5ML PO SUSP: 30.0000 mL | ORAL | Status: DC | PRN

## 2024-04-29 MED ORDER — LORAZEPAM 2 MG/ML IJ SOLN: 2.0000 mg | Freq: Three times a day (TID) | INTRAMUSCULAR | Status: DC | PRN

## 2024-04-29 MED ORDER — TRAZODONE HCL 100 MG PO TABS: 100.0000 mg | ORAL_TABLET | Freq: Every day | ORAL | Status: DC

## 2024-04-29 MED ORDER — CLONAZEPAM 0.5 MG PO TABS
0.5000 mg | ORAL_TABLET | Freq: Three times a day (TID) | ORAL | Status: DC | PRN
  Administered 2024-04-29 (×3): 0.5 mg via ORAL
  Filled 2024-04-29 (×3): qty 1

## 2024-04-29 MED ORDER — ACETAMINOPHEN 325 MG PO TABS
650.0000 mg | ORAL_TABLET | Freq: Four times a day (QID) | ORAL | Status: DC | PRN
  Administered 2024-04-30 – 2024-05-03 (×9): 650 mg via ORAL
  Filled 2024-04-29 (×9): qty 2

## 2024-04-29 MED ORDER — OLANZAPINE 5 MG PO TBDP: 10.0000 mg | ORAL_TABLET | Freq: Two times a day (BID) | ORAL | Status: DC | PRN

## 2024-04-29 MED ORDER — TRAZODONE HCL 100 MG PO TABS
100.0000 mg | ORAL_TABLET | Freq: Every day | ORAL | Status: DC
  Administered 2024-04-29 (×2): 100 mg via ORAL
  Filled 2024-04-29 (×2): qty 1

## 2024-04-29 MED ORDER — HALOPERIDOL 5 MG PO TABS
5.0000 mg | ORAL_TABLET | Freq: Three times a day (TID) | ORAL | Status: DC | PRN
  Administered 2024-04-30 – 2024-05-03 (×5): 5 mg via ORAL
  Filled 2024-04-29 (×5): qty 1

## 2024-04-29 MED ORDER — LAMOTRIGINE 100 MG PO TABS
200.0000 mg | ORAL_TABLET | Freq: Every day | ORAL | Status: DC
  Administered 2024-04-29: 200 mg via ORAL
  Filled 2024-04-29: qty 2

## 2024-04-29 MED ORDER — LORAZEPAM 2 MG/ML IJ SOLN
2.0000 mg | Freq: Once | INTRAMUSCULAR | Status: AC
  Administered 2024-04-29: 2 mg via INTRAMUSCULAR
  Filled 2024-04-29: qty 1

## 2024-04-29 MED ORDER — HALOPERIDOL LACTATE 5 MG/ML IJ SOLN: 10.0000 mg | Freq: Three times a day (TID) | INTRAMUSCULAR | Status: DC | PRN

## 2024-04-29 MED ORDER — MAGNESIUM HYDROXIDE 400 MG/5ML PO SUSP: 15.0000 mL | Freq: Every day | ORAL | Status: DC | PRN

## 2024-04-29 MED ORDER — LORAZEPAM 2 MG/ML IJ SOLN
2.0000 mg | Freq: Three times a day (TID) | INTRAMUSCULAR | Status: DC | PRN
  Administered 2024-04-30: 2 mg via INTRAMUSCULAR
  Filled 2024-04-29: qty 1

## 2024-04-29 MED ORDER — PANTOPRAZOLE SODIUM 40 MG PO TBEC
40.0000 mg | DELAYED_RELEASE_TABLET | Freq: Every day | ORAL | Status: DC
  Administered 2024-04-30 – 2024-05-04 (×5): 40 mg via ORAL
  Filled 2024-04-29 (×5): qty 1

## 2024-04-29 MED ORDER — OLANZAPINE 10 MG IM SOLR: 10.0000 mg | Freq: Two times a day (BID) | INTRAMUSCULAR | Status: DC | PRN

## 2024-04-29 MED ORDER — DIPHENHYDRAMINE HCL 50 MG/ML IJ SOLN
50.0000 mg | Freq: Three times a day (TID) | INTRAMUSCULAR | Status: DC | PRN
  Administered 2024-05-01: 50 mg via INTRAMUSCULAR

## 2024-04-29 MED ORDER — PANTOPRAZOLE SODIUM 40 MG PO TBEC
40.0000 mg | DELAYED_RELEASE_TABLET | Freq: Every day | ORAL | Status: DC
  Administered 2024-04-29: 40 mg via ORAL
  Filled 2024-04-29: qty 1

## 2024-04-29 NOTE — Tx Team (Signed)
 Initial Treatment Plan 04/29/2024 10:38 PM Barbara Orozco FMW:[id number]    PATIENT STRESSORS: Medication change or noncompliance   Traumatic event     PATIENT STRENGTHS: Ability for insight  Motivation for treatment/growth    PATIENT IDENTIFIED PROBLEMS: Depression  Anxiety  SI                 DISCHARGE CRITERIA:  Motivation to continue treatment in a less acute level of care Verbal commitment to aftercare and medication compliance  PRELIMINARY DISCHARGE PLAN: Outpatient therapy Return to previous living arrangement  PATIENT/FAMILY INVOLVEMENT: This treatment plan has been presented to and reviewed with the patient, Barbara Orozco. The patient has been given the opportunity to ask questions and make suggestions.  Donnice ONEIDA Chess, RN 04/29/2024, 10:38 PM

## 2024-04-29 NOTE — ED Notes (Signed)
 Pt made aware that she will be seen at 0800. Pt continuing to hit back of head against wall.

## 2024-04-29 NOTE — ED Notes (Signed)
 Reymundo Salmon, NP agreed via secure chat to take a look at pt's medication dosages for: Klonopin  Trazodone  Propanolol

## 2024-04-29 NOTE — ED Notes (Signed)
 This RN is at bedside asking pt what morning meds she takes. Pt crying stating please don't take me. This RN attempted to speak with pt but she is inconsolable at this time.

## 2024-04-29 NOTE — ED Notes (Signed)
 Pt visitor at bedside. Barbara Orozco Pt Advocate at bedside supervising visit.

## 2024-04-29 NOTE — ED Notes (Signed)
 Pt is expressing that she does not wish to go to [facility] if possible. Messaged Psych team at this time.

## 2024-04-29 NOTE — ED Notes (Signed)
 Pt has concerns about receiving her home medications (mainly mood stabilizers). This RN messaged Reymundo Salmon, NP about reviewing pt home medications and ordering them.
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
## 2024-04-29 NOTE — ED Provider Notes (Signed)
 Notified by RN of possible allergic reaction.  Patient with documented allergy to cinnamon with reaction of itching.  Was eating [place] toast here and began to feel a burning sensation in her mouth consistent with prior allergic reactions.  No rash, GI symptoms.  Patient assessed at bedside.  No voice changes, evidence of anaphylaxis.  Reports improvement with Benadryl  in the past.  Ordered for IM Benadryl  with improvement in symptoms.  Given resolution of symptoms, do think patient can remain medically cleared for psychiatric disposition.   Levander Slate, MD 04/29/24 [phone]

## 2024-04-29 NOTE — ED Notes (Addendum)
 Pts mood labile - frequently ranging from laughing/conversing with staff to bumping back of head on wall while making sobbing noises. When offered a room for pt to go into (instead of hallway) pt refuses and states, I don't trust myself. And I have my bra on so I could strangle myself with it. Behaviors noted to increase when stimuli from staff is less - concern for attention seeking behaviors and NSSH.

## 2024-04-29 NOTE — ED Notes (Addendum)
 Pt voices concerns about the dosages of medications that she is prescribed. She also would like to confirm that all of her home medications are ordered for her. This Rn messaged Reymundo Salmon, NP asking for her assistance with pt's medications.

## 2024-04-29 NOTE — ED Notes (Signed)
[place]  [facility]  DEPT  CALLED  FOR  TRANSPORT  TO [facility] ?
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
## 2024-04-29 NOTE — ED Notes (Signed)
 Secretary Olam called Dietary supervisor Almarie at this time. Per Dietary there is no cinnamon in the food. Pt remains stable in NAD. Pt is no longer gasping.
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
## 2024-04-29 NOTE — ED Notes (Signed)
 Pt currently at nurses station asking this nurse for her morning meds and what will happen as far as transport once she gets admitted inpatient. This RN and Diplomatic Services operational officer Olam explained to pt that where she goes should set her up with a way to get back home. Pt stated okay. and returned to bed.

## 2024-04-29 NOTE — ED Notes (Addendum)
 Update:  Per [facility] Danika, Patient will need a single occupancy room for inpatient treatment.  AC will update when an appropriate bed is available.  The previous acceptance note was deleted.  Rochelle, [facility] [phone]
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
## 2024-04-29 NOTE — ED Notes (Signed)
 Sheriff  dept   cancel  per  Rosina  charge nurse pt  not  going to  [place]  [facility]  at  this  time

## 2024-04-29 NOTE — ED Notes (Signed)
Pt given PM snack a this time.

## 2024-04-29 NOTE — ED Notes (Signed)
 Pt continues to cry and be inconsolable.

## 2024-04-29 NOTE — ED Notes (Signed)
 Pt given french toast with her breakfast this morning. Pt had a reaction while eating. Pt began gasping and stating that she is allergic to cinnamon. Allergy is present in chart. Dr. Levander pulled to bedside for assessment. Pt continues to make gasping noises. Airway is clear. Pt able to speak to RN without difficulty. Benadryl  ordered and administered IM.

## 2024-04-29 NOTE — Progress Notes (Signed)
 Pt calm and pleasant during assessment denying SI/HI/AVH. Pt stated she has been taking her prescribed medications but feels like they are not helping her. Pt stated she hasn't been sleeping or eating. Pt has had increased thoughts of wanting to hurt herself, but said she feels safe here with us . Pt verbally contracts for safety with this Clinical research associate. Pt skin assessment completed with Naa, MHT. Pt has 3 tattoos, 2 bilateral inside wrists and one on her Left upper chest. No other abnormalities or contraband found. Pt oriented to the unit and her room. Pt to the unit and her room, given snack. Pt given education, support, and encouragement to be active in her treatment plan. Pt being monitored Q 15 minutes for safety per unit protocol, remains safe on the unit

## 2024-04-29 NOTE — ED Notes (Signed)
 Pt currently sitting in the floor. Attempting to get EKG at this time. Pt stating I am not going to that facility. This RN informed pt that she is under IVC and she will have to follow where psych has recommended her to go until she is a safe d/c.

## 2024-04-29 NOTE — ED Notes (Addendum)
 Pt standing at nurses station asking why she is being sent to [facility]. RN explained to pt that upon psych NP assessment this was their recommendation. Pt stating you will have to drag me out of here. Pt currently sobbing loudly at bedside. Pt refusing EKG. Psych team aware.

## 2024-04-29 NOTE — ED Notes (Signed)
 Communicated to pt POC and pt was okay with POC. Pt returned to bed. Currently crying.

## 2024-04-29 NOTE — ED Notes (Signed)
IVC  GOING  TO  [facility]  TONIGHT 

## 2024-04-29 NOTE — ED Notes (Signed)
 Pt speaking with psych at this time in interview rm. Security present.

## 2024-04-29 NOTE — ED Notes (Signed)
 Hospital meal provided, pt tolerated w/o complaints.  Waste discarded appropriately.

## 2024-04-29 NOTE — ED Notes (Deleted)
 Patient has been accepted to [facility] 04/29/24 Patient assigned to room 303-2 Accepting physician is Dr. Kennyth. Call report to [phone]. Representative was [facility] Danika.   ER Staff is aware of it: [facility] ER Secretary Dr. Levander, ER MD Harlene, RN Patient's Nurse  Address:  [address]                 [place], [place] [address]    Michial Skeen, [facility], [place] [phone]

## 2024-04-30 DIAGNOSIS — F603 Borderline personality disorder: Secondary | ICD-10-CM

## 2024-04-30 DIAGNOSIS — F319 Bipolar disorder, unspecified: Secondary | ICD-10-CM

## 2024-04-30 DIAGNOSIS — F1210 Cannabis abuse, uncomplicated: Secondary | ICD-10-CM

## 2024-04-30 DIAGNOSIS — F4311 Post-traumatic stress disorder, acute: Secondary | ICD-10-CM

## 2024-04-30 LAB — LAMOTRIGINE LEVEL: Lamotrigine Lvl: 5.2 ug/mL (ref 2.0–20.0)

## 2024-04-30 MED ORDER — DESVENLAFAXINE SUCCINATE ER 50 MG PO TB24
50.0000 mg | ORAL_TABLET | Freq: Every day | ORAL | Status: DC
  Administered 2024-04-30 – 2024-05-03 (×4): 50 mg via ORAL
  Filled 2024-04-30 (×6): qty 1

## 2024-04-30 MED ORDER — ONDANSETRON 4 MG PO TBDP
4.0000 mg | ORAL_TABLET | Freq: Three times a day (TID) | ORAL | Status: DC | PRN
  Administered 2024-05-03: 4 mg via ORAL
  Filled 2024-04-30: qty 1

## 2024-04-30 MED ORDER — CHOLESTYRAMINE LIGHT 4 G PO PACK
4.0000 g | PACK | Freq: Two times a day (BID) | ORAL | Status: DC
  Administered 2024-04-30 – 2024-05-04 (×8): 4 g via ORAL
  Filled 2024-04-30 (×9): qty 1

## 2024-04-30 MED ORDER — PROPRANOLOL HCL 20 MG PO TABS
10.0000 mg | ORAL_TABLET | Freq: Three times a day (TID) | ORAL | Status: DC | PRN
  Administered 2024-04-30 – 2024-05-04 (×2): 10 mg via ORAL
  Filled 2024-04-30 (×3): qty 1

## 2024-04-30 MED ORDER — MELATONIN 5 MG PO TABS
5.0000 mg | ORAL_TABLET | Freq: Every day | ORAL | Status: DC
  Administered 2024-04-30 – 2024-05-03 (×4): 5 mg via ORAL
  Filled 2024-04-30 (×4): qty 1

## 2024-04-30 MED ORDER — CLONAZEPAM 1 MG PO TABS
1.0000 mg | ORAL_TABLET | Freq: Two times a day (BID) | ORAL | Status: DC | PRN
  Administered 2024-04-30 – 2024-05-04 (×8): 1 mg via ORAL
  Filled 2024-04-30 (×8): qty 1

## 2024-04-30 MED ORDER — NON FORMULARY: 50.0000 mg | Freq: Every day | Status: DC

## 2024-04-30 MED ORDER — VITAMIN D (ERGOCALCIFEROL) 1.25 MG (50000 UNIT) PO CAPS
50000.0000 [IU] | ORAL_CAPSULE | ORAL | Status: DC
  Administered 2024-05-01: 50000 [IU] via ORAL
  Filled 2024-04-30: qty 1

## 2024-04-30 MED ORDER — CYCLOBENZAPRINE HCL 10 MG PO TABS
10.0000 mg | ORAL_TABLET | Freq: Every day | ORAL | Status: DC | PRN
  Administered 2024-04-30 – 2024-05-04 (×5): 10 mg via ORAL
  Filled 2024-04-30 (×5): qty 1

## 2024-04-30 MED ORDER — CARIPRAZINE HCL 1.5 MG PO CAPS
1.5000 mg | ORAL_CAPSULE | Freq: Every day | ORAL | Status: DC
  Administered 2024-04-30 – 2024-05-02 (×3): 1.5 mg via ORAL
  Filled 2024-04-30 (×3): qty 1

## 2024-04-30 MED ORDER — LIDOCAINE 5 % EX PTCH
1.0000 | MEDICATED_PATCH | Freq: Every day | CUTANEOUS | Status: DC | PRN
  Administered 2024-04-30 – 2024-05-03 (×3): 1 via TRANSDERMAL
  Filled 2024-04-30 (×5): qty 1

## 2024-04-30 MED ORDER — LAMOTRIGINE 100 MG PO TABS
200.0000 mg | ORAL_TABLET | Freq: Every day | ORAL | Status: DC
  Administered 2024-04-30 – 2024-05-03 (×4): 200 mg via ORAL
  Filled 2024-04-30 (×5): qty 2
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
## 2024-04-30 NOTE — Progress Notes (Signed)
   04/30/24 0830  Psych Admission Type (Psych Patients Only)  Admission Status Involuntary  Psychosocial Assessment  Patient Complaints Anxiety;Depression;Self-harm thoughts (patient states that she has been off of her mood stabilizers for two days and is starting to feel the affects; also states that the whole world comes in on me, and that she always has SI.)  Eye Contact Fair;Watchful  Facial Expression Anxious;Worried  Affect Preoccupied (patient is extremely preoccupied with getting her correct medication.)  Speech Logical/coherent;Rapid  Interaction Assertive  Motor Activity Slow  Appearance/Hygiene Unremarkable  Behavior Characteristics Cooperative;Appropriate to situation  Mood Depressed;Anxious;Preoccupied;Pleasant  Aggressive Behavior  Effect No apparent injury  Thought Process  Coherency WDL  Content Preoccupation  Delusions None reported or observed  Perception WDL  Hallucination None reported or observed  Judgment WDL  Confusion None  Danger to Self  Current suicidal ideation? Passive (patient states all the time.)  Agreement Not to Harm Self Yes  Description of Agreement Verbal (patient states yes, because you have taken all the sharp things away.)  Danger to Others  Danger to Others None reported or observed

## 2024-04-30 NOTE — BHH Counselor (Signed)
 Patient touched base with CSW to confirm that she is interested in [facility] program.   Referral sent on patient's behalf. Insurance verification signed by patient sent on patient's behalf to [email or web address].   CSW to continue to assess.   Anjana Cheek, MSW, LCSWA 04/30/2024 1:40 PM

## 2024-04-30 NOTE — Group Note (Signed)
 Date:  04/30/2024 Time:  9:22 PM  Group Topic/Focus:  Coping With Mental Health Crisis:   The purpose of this group is to help patients identify strategies for coping with mental health crisis.  Group discusses possible causes of crisis and ways to manage them effectively. Identifying Needs:   The focus of this group is to help patients identify their personal needs that have been historically problematic and identify healthy behaviors to address their needs.    Participation Level:  Active  Participation Quality:  Appropriate  Affect:  Appropriate  Cognitive:  Appropriate  Insight: Limited  Engagement in Group:  Engaged and Supportive  Modes of Intervention:  Discussion and Education  Additional Comments:  n/a  Barbara Orozco L 04/30/2024, 9:22 PM
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
## 2024-04-30 NOTE — BHH Counselor (Signed)
 Patient provided with 3 pair undergarments from clothing closet as she had no undergarments on her person or in belongings.   Aryahi Denzler, MSW, LCSWA 04/30/2024 2:08 PM

## 2024-04-30 NOTE — Group Note (Signed)
 [facility] LCSW Group Therapy Note   Group Date: 04/30/2024 Start Time: 1300 End Time: 1350   Type of Therapy/Topic:  Group Therapy:  Balance in Life  Participation Level:  Minimal   Description of Group:    This group will address the concept of balance and how it feels and looks when one is unbalanced. Patients will be encouraged to process areas in their lives that are out of balance, and identify reasons for remaining unbalanced. Facilitators will guide patients utilizing problem- solving interventions to address and correct the stressor making their life unbalanced. Understanding and applying boundaries will be explored and addressed for obtaining  and maintaining a balanced life. Patients will be encouraged to explore ways to assertively make their unbalanced needs known to significant others in their lives, using other group members and facilitator for support and feedback.  Therapeutic Goals: Patient will identify two or more emotions or situations they have that consume much of in their lives. Patient will identify signs/triggers that life has become out of balance:  Patient will identify two ways to set boundaries in order to achieve balance in their lives:  Patient will demonstrate ability to communicate their needs through discussion and/or role plays  Summary of Patient Progress: Patient was present only for the icebreaker and participated in that. She was called out of group and did not return.    Therapeutic Modalities:   Cognitive Behavioral Therapy Solution-Focused Therapy Assertiveness Training   Nadara JONELLE Fam, LCSW

## 2024-04-30 NOTE — BHH Counselor (Addendum)
 After conversation with [facility] admissions coordinator, Lyle. Patient was told that it could be up to a week before she is given a response in terms of admission. Patient scheduled her phone assessment for 10 AM with admissions coordinator, Mauro as this is the next step in the admission process.   CSW touched base with Lyle who confirmed that the typical turn around time is a week but that the team will work as hard as they can to expedite the process given the patient's circumstances and inpatient status.   This was communicated to patient who became visibly upset. Patient became tearful reporting It's not safe for me to go home. CSW worked to calm the patient. Patient then returned to her room where she started to bang and hit her head against the wall. She was asked to come out of her room and started to scream and growl at nursing staff.   Nursing staff touched base with provider to assess safety precautions.   Team to continue to assess.   Mylisa Brunson, MSW, LCSWA 04/30/2024 3:55 PM

## 2024-04-30 NOTE — BHH Counselor (Signed)
 Patient expressed interest in a residential mental health facility.   CSW provided patient with information for [facility] residential program as well as contact information for admission coordinator, Morna.  Patient to speak to partner and let CSW know if she is interested. If so, CSW will continue with referral process.   CSW to continue to assess.   Adisa Litt, MSW, LCSWA 04/30/2024 11:30 AM

## 2024-04-30 NOTE — Group Note (Signed)
 Date:  04/30/2024 Time:  5:10 PM  Group Topic/Focus:  Wellness Toolbox:   The focus of this group is to discuss various aspects of wellness, balancing those aspects and exploring ways to increase the ability to experience wellness.  Patients will create a wellness toolbox for use upon discharge.    Participation Level:  Active  Participation Quality:  Appropriate  Affect:  Appropriate  Cognitive:  Appropriate  Insight: Appropriate  Engagement in Group:  Engaged  Modes of Intervention:  Activity and Socialization  Additional Comments:    Barbara Orozco 04/30/2024, 5:10 PM

## 2024-04-30 NOTE — BHH Suicide Risk Assessment (Signed)
 [facility] Admission Suicide Risk Assessment   Nursing information obtained from:  Patient Demographic factors:  Caucasian, Living alone Current Mental Status:  Suicidal ideation indicated by others Loss Factors:  NA (Medications not working) Historical Factors:  Impulsivity Risk Reduction Factors:  Positive therapeutic relationship  Total Time spent with patient: 1 hour Principal Problem: Bipolar disorder (HCC) Diagnosis:  Principal Problem:   Bipolar disorder (HCC) Active Problems:   Borderline personality disorder (HCC)   Acute posttraumatic stress disorder   Cannabis abuse  Subjective Data:   History of Present Illness:  Patient is a 38 year old female with a past medical history of asthma, endometriosis, ulcerative colitis, and GERD, and past psychiatric history of Bipolar I Disorder, Borderline Personality Disorder, Bipolar II Disorder, ADHD, and Cocaine Use Disorder, who presents to [facility] voluntarily, related to intrusive thoughts of hanging herself, as well as reported self-injurious behaviors including banging her head on the wall and pulling her hair.  Per Chart Review: She has a history of multiple inpatient psychiatric admissions, the most recent being in January 2025. She has engaged in outpatient treatment with [facility] for psychotherapy. Previous medication trials include Lamictal , Trazodone , Desyrel , Effexor  (Faxine), Buspirone , Klonopin , Topamax , Wellbutrin, Depakote, Spravato, and Lithium. Her mental health diagnoses include Bipolar I Disorder, Borderline Personality Disorder, Bipolar II Disorder, ADHD, and Cocaine Use Disorder. She reported a mix of emotional symptoms in the ED including depression, anxiety, panic, obsessive-compulsive, and impulsive behaviors. She was noted to be emotionally labile in the emergency department.   Patient Report: Patient is seen today for initial psychiatric evaluation. Upon approach, she is noted to be walking in  the hall, dressed for the day with fair grooming and bright affect. Following introductions and education related to the purpose of this interview, we agree to meet and review events leading to this admission, to which she reports significant mood dysregulation, anxiety and self harming behaviors. She states everything can be fine and then without warning, every bad memory from her past resurfaces, causing anxiety that escalates into panic and spirals into suicidal ideation. She describes these symptoms as severe and distressing, and reports an inability to gain control once they begin. She states she will bang her head against the wall, scream, and scratch her face for days on end, feeling as if the whole world is ending. She reports needing inpatient admission for safety as she is afraid otherwise she will harm self. She denies any recent new significant stressors however reports she always experiences this high level of instability but has worsened in recent weeks.   She endorses longstanding depressive symptoms since age 30, including sadness, hopelessness, anhedonia, social isolation, constant worry, poor sleep, crying spells, and difficulty participating in self-care daily. She denies any history of auditory or visual hallucinations, paranoia, or delusions. She expresses concern with her diagnosis of bipolar disorder, stating she has never had a manic episode. On review, she endorses poor sleep but does not describe and current or history of hyperactivity or elevated mood. She reports racing, looping thoughts at night. Her first suicide attempt occurred in February 2024 following the separation of her marriage, during which she attempted to overdose. This is her third inpatient psychiatric hospitalization. She reports unstable outpatient care due to frequent provider turnover. She denies current SI/HI reporting feels safe in this secure setting.  Continued Clinical Symptoms:  Alcohol Use Disorder  Identification Test Final Score (AUDIT): 0 The Alcohol Use Disorders Identification Test, Guidelines for Use in Primary Care, Second  Edition.  World Science writer [facility] Score between 0-7:  no or low risk or alcohol related problems. Score between 8-15:  moderate risk of alcohol related problems. Score between 16-19:  high risk of alcohol related problems. Score 20 or above:  warrants further diagnostic evaluation for alcohol dependence and treatment.   CLINICAL FACTORS:   Severe Anxiety and/or Agitation Panic Attacks Bipolar Disorder:   Mixed State Depression:   Anhedonia Hopelessness Impulsivity Insomnia Alcohol/Substance Abuse/Dependencies Personality Disorders:   Cluster B More than one psychiatric diagnosis Unstable or Poor Therapeutic Relationship Previous Psychiatric Diagnoses and Treatments Medical Diagnoses and Treatments/Surgeries   Musculoskeletal: Strength & Muscle Tone: within normal limits Gait & Station: normal   Psychiatric Specialty Exam:  Presentation  General Appearance:  Appropriate for Environment  Eye Contact: Good  Speech: Normal Rate  Speech Volume: Normal  Handedness: Right   Mood and Affect  Mood: Euthymic  Affect: Congruent   Thought Process  Thought Processes: Linear  Descriptions of Associations:Circumstantial  Orientation:Full (Time, Place and Person)  Thought Content:Logical  History of Schizophrenia/Schizoaffective disorder:No  Duration of Psychotic Symptoms:No data recorded Hallucinations:Hallucinations: None  Ideas of Reference:None  Suicidal Thoughts:Suicidal Thoughts: No  Homicidal Thoughts:Homicidal Thoughts: No   Sensorium  Memory: Immediate Good; Remote Good  Judgment: Fair  Insight: Fair   Art therapist  Concentration: Good  Attention Span: Good  Recall: Good  Fund of Knowledge: Good  Language: Good   Psychomotor Activity  Psychomotor Activity: Psychomotor  Activity: Normal   Assets  Assets: Housing; Vocational/Educational; Social Support; Manufacturing systems engineer; Desire for Improvement; Financial Resources/Insurance; Intimacy   Sleep  Sleep: Sleep: Poor    Physical Exam: Physical Exam ROS Blood pressure 115/72, pulse 72, temperature (!) 97.1 F (36.2 C), resp. rate 16, height 5' 7 (1.702 m), weight 122.5 kg, SpO2 99%. Body mass index is 42.3 kg/m.   COGNITIVE FEATURES THAT CONTRIBUTE TO RISK:  None    SUICIDE RISK:   Mild:  Suicidal ideation of limited frequency, intensity, duration, and specificity.  There are no identifiable plans, no associated intent, mild dysphoria and related symptoms, good self-control (both objective and subjective assessment), few other risk factors, and identifiable protective factors, including available and accessible social support.  PLAN OF CARE:   It is the judgment of the undersigned that patient is appropriate for treatment in this acute psychiatric setting related to concern for safety in the context of suicidal thoughts, emotional dysregulation, and self-injurious behaviors.Given continued significant mood lability and ruminative thought content, patient was started on Vraylar 1.5 mg by mouth daily to target mood instability and intrusive thoughts. Presentation currently aligns with historical diagnosis of Bipolar II Disorder, primarily depressive episodes with mixed features. Further clinical assessment is required for diagnostic clarity. At this time, the addition of an atypical antipsychotic is appropriate to support stabilization.Patient denies hallucinations, paranoia, and delusions. She does not appear psychotic. She appears calm and relaxed and engages brightly, which is incongruent with her reported internal distress.Patient acknowledges history of Borderline Personality Disorder and previously participated in intensive outpatient DBT therapy.Medication education is provided to include risks,  benefits, and side effects. Patient verbalized understanding and agrees to plan of care.   Medications:  Pristiq  [dose not specified]  Klonopin  1 mg by mouth twice daily as needed for anxiety  Lamictal  200 mg by mouth daily  Propranolol 10 mg by mouth three times daily as needed for anxiety  Trazodone  100 mg by mouth at bedtime  Melatonin 5 mg by mouth at bedtime (initiated  in hospital)  Vraylar 1.5 mg by mouth daily (initiated in hospital)  I certify that inpatient services furnished can reasonably be expected to improve the patient's condition.   Hoy CHRISTELLA Pinal, NP 04/30/2024, 12:00 PM

## 2024-04-30 NOTE — Plan of Care (Signed)

## 2024-04-30 NOTE — Group Note (Signed)
 Date:  04/30/2024 Time:  12:31 AM  Group Topic/Focus:  Overcoming Stress:   The focus of this group is to define stress and help patients assess their triggers.    Participation Level:  Did Not Attend  Participation Quality:  none  Affect:  none  Cognitive:  none  Insight: None  Engagement in Group:  none  Modes of Intervention:  none  Additional Comments:  none   Ameliyah Sarno 04/30/2024, 12:31 AM

## 2024-04-30 NOTE — H&P (Signed)
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 outpatient DBT therapy.Medication education is provided to include risks, benefits, and side effects. Patient verbalized understanding and agrees to plan of care.  Medications:  Pristiq  [dose not specified]  Klonopin  1 mg by mouth twice daily as needed for anxiety  Lamictal  200 mg by mouth daily  Propranolol 10 mg by mouth three times daily as needed for anxiety  Trazodone  100 mg by mouth at bedtime  Melatonin 5 mg by mouth at bedtime (initiated in hospital)  Vraylar 1.5 mg by mouth daily (initiated in hospital)     Long Term Goal(s): Improvement in symptoms so as ready for discharge  Short Term Goals: Ability to identify changes in lifestyle to reduce recurrence of condition will improve  Physician Treatment Plan for Secondary Diagnosis: Principal Problem:   Bipolar disorder (HCC) Active Problems:   Borderline personality disorder (HCC)   Acute  posttraumatic stress disorder   Cannabis abuse  Long Term Goal(s): Improvement in symptoms so as ready for discharge  Short Term Goals: Ability to identify changes in lifestyle to reduce recurrence of condition will improve  I certify that inpatient services furnished can reasonably be expected to improve the patient's condition.    Hoy CHRISTELLA Pinal, NP 6/26/202511:31 AM

## 2024-04-30 NOTE — Plan of Care (Signed)
 Pt new to the unit tonight, hasn't had time to progress  Problem: Education: Goal: Emotional status will improve Outcome: Not Progressing Goal: Mental status will improve Outcome: Not Progressing

## 2024-04-30 NOTE — Group Note (Signed)
 Date:  04/30/2024 Time:  6:06 PM  Group Topic/Focus:  Goals Group:   The focus of this group is to help patients establish daily goals to achieve during treatment and discuss how the patient can incorporate goal setting into their daily lives to aide in recovery.    Participation Level:  Did Not Attend   Deitra Clap [facility] 04/30/2024, 6:06 PM

## 2024-05-01 LAB — HEMOGLOBIN A1C
Hgb A1c MFr Bld: 5 % (ref 4.8–5.6)
Mean Plasma Glucose: 96.8 mg/dL

## 2024-05-01 LAB — LIPID PANEL
Cholesterol: 229 mg/dL — ABNORMAL HIGH (ref 0–200)
HDL: 38 mg/dL — ABNORMAL LOW (ref >40)
LDL Cholesterol: 157 mg/dL — ABNORMAL HIGH (ref 0–99)
Total CHOL/HDL Ratio: 6 ratio
Triglycerides: 171 mg/dL — ABNORMAL HIGH (ref <150)
VLDL: 34 mg/dL (ref 0–40)

## 2024-05-01 MED ORDER — CHLORPROMAZINE HCL 50 MG/2ML IJ SOLN: 50.0000 mg | Freq: Four times a day (QID) | INTRAMUSCULAR | Status: DC | PRN

## 2024-05-01 MED ORDER — CHLORPROMAZINE HCL 100 MG PO TABS
100.0000 mg | ORAL_TABLET | Freq: Four times a day (QID) | ORAL | Status: DC | PRN
  Administered 2024-05-01 – 2024-05-04 (×4): 100 mg via ORAL
  Filled 2024-05-01 (×5): qty 1

## 2024-05-01 NOTE — [facility] Counselor (Signed)
 Admissions coorindator touched base with CSW to make aware of decision.   Patient has not been accepted to [facility] program as the [facility] team believes that with the patient's current diagnoisis she will need a program with a stronger DBT framework.   CSW along with the NP communicated this to the patient. The patient became extremely tearful and started to scream and growl. Patient started hyperventilating and reported feeling unsafe at home. Team further explored this and patient reports that she feels unsafe because of me. Patient was pulling her hair and tearful and was diffuclt to calm. Patient eventually left to de-esculate while team stood at door to ensure safety while patient self-regulated.    CSW to continue to assess.   Affie Gasner, MSW, LCSWA 05/01/2024 2:13 PM

## 2024-05-01 NOTE — H&P (Deleted)
 [facility] [address] MD Progress Note  05/01/2024 5:20 PM Barbara Orozco  MRN:  [id number]   Subjective:  Chart reviewed, case discussed in multidisciplinary meeting, patient seen during rounds.   Patient is a 38 year old female with a past medical history of asthma, endometriosis, ulcerative colitis, and GERD, and a past psychiatric history of Bipolar I Disorder, Borderline Personality Disorder, Bipolar II Disorder, ADHD, and Cocaine Use Disorder. She presents voluntarily for psychiatric evaluation due to intrusive thoughts of hanging herself and reported self-injurious behaviors including banging her head on the wall and pulling her hair.  Per Chart Review Patient has a history of multiple inpatient psychiatric admissions, most recently in January 2025. She has received outpatient treatment with [facility] for psychotherapy. Prior psychiatric medication trials include Lamictal , Trazodone , Desyrel , Effexor  (Faxine), Buspirone , Klonopin , Topamax , Wellbutrin, Depakote, Spravato, and Lithium. She has been diagnosed with Bipolar I Disorder, Borderline Personality Disorder, Bipolar II Disorder, ADHD, and Cocaine Use Disorder. In the emergency department, she endorsed a mix of symptoms including depression, anxiety, panic, obsessive-compulsive tendencies, and emotional dysregulation. She appeared emotionally labile.  Patient seen today for follow-up psychiatric evaluation. She has been seen multiple times in interview with this provider and typically engages appropriately until the topic of increasing PRN benzodiazepines is raised. At that point, patient becomes tearful, hopeless, and negativistic, stating, "You just don't want to help me." The plan of care is reviewed with the patient, including continued mood stabilizer therapy and the recent initiation of Vraylar  for mood lability and impulsivity.  Today, her mood remains erratic, and she is noted to be emotionally reactive and demanding. This provider was called to the  patient's room during a meeting with social work, where discharge planning and outpatient program options were being discussed. The patient became visibly upset, refused eye contact, cried, yelled, and threatened self-harm. Floor staff implemented close monitoring. An as-needed medication was ordered by the attending psychiatrist to address acute agitation and emotional dysregulation. Limited assessment as patient refusing to participate in formal assessment and review of symptoms due to extreme lability.   Assessment: Patient continues to demonstrate significant mood instability, poor distress tolerance, and impulsivity in the context of Borderline Personality Disorder traits and underlying affective disorder. She remains at risk for self-harm and is not yet appropriate for discharge.   Sleep: Fair  Appetite:  Good  Past Psychiatric History: see h&P Family History:  Family History  Problem Relation Age of Onset   Colitis Mother    Ulcerative colitis Mother    Heart disease Mother    Endocrine tumor Mother        pituitary tumor   Colitis Father    Ulcerative colitis Father    Diabetes Father    Endocrine tumor Maternal Grandmother        piturtary tumor   Colitis Paternal Grandmother    Ulcerative colitis Paternal Grandmother    Stomach cancer Paternal Aunt    Colon cancer Neg Hx    Pancreatic cancer Neg Hx    Esophageal cancer Neg Hx    Social History:  Social History   Substance and Sexual Activity  Alcohol Use Yes   Alcohol/week: 6.0 standard drinks of alcohol   Types: 2 Glasses of wine, 2 Cans of beer, 2 Shots of liquor per week     Social History   Substance and Sexual Activity  Drug Use Yes   Types: Marijuana    Social History   Socioeconomic History   Marital status: Divorced    Spouse  name: Not on file   Number of children: Not on file   Years of education: Not on file   Highest education level: Not on file  Occupational History    Employer: [employer]   Tobacco Use   Smoking status: Former    Current packs/day: 0.00    Types: Cigarettes    Quit date: 2020    Years since quitting: 5.4   Smokeless tobacco: Never   Tobacco comments:    Social smoker quit 6-8 yrs ago per pt on 01-25-2022  Vaping Use   Vaping status: Never Used  Substance and Sexual Activity   Alcohol use: Yes    Alcohol/week: 6.0 standard drinks of alcohol    Types: 2 Glasses of wine, 2 Cans of beer, 2 Shots of liquor per week   Drug use: Yes    Types: Marijuana   Sexual activity: Yes    Birth control/protection: None  Other Topics Concern   Not on file  Social History Narrative   Engaged   occ EtOH, + Marijuana, no tobacco now - former   Right Handed   Drinks Caffeine     One Story Home    Social Drivers of Health   Financial Resource Strain: Not on file  Food Insecurity: No Food Insecurity (04/29/2024)   Hunger Vital Sign    Worried About Running Out of Food in the Last Year: Never true    Ran Out of Food in the Last Year: Never true  Transportation Needs: No Transportation Needs (04/29/2024)   PRAPARE - Administrator, Civil Service (Medical): No    Lack of Transportation (Non-Medical): No  Physical Activity: Not on file  Stress: Not on file (09/08/2023)  Social Connections: Socially Isolated (04/29/2024)   Social Connection and Isolation Panel    Frequency of Communication with Friends and Family: Twice a week    Frequency of Social Gatherings with Friends and Family: Once a week    Attends Religious Services: Never    Database administrator or Organizations: No    Attends Engineer, structural: Never    Marital Status: Never married   Past Medical History:  Past Medical History:  Diagnosis Date   Acute posttraumatic stress disorder    ADHD (attention deficit hyperactivity disorder), combined type    Allergy    Anal fistula    Arm fracture    right and left   Asthma    Bipolar 2 disorder (HCC)    Borderline personality  disorder (HCC) 05/23/2022   Cannabis use disorder    Cocaine use disorder, moderate, in early remission (HCC)    Complication of anesthesia    wakes up angry for about 5 minutes   Depression    Endometriosis    Foot pain    right foot hurts since last night no reported injury per pt on 01-25-2022   GERD (gastroesophageal reflux disease)    Grief associated with loss of fetus    History of 2019 novel coronavirus disease (COVID-19) 04/29/2020   + Ag test - can see in Care everywhere   History of chicken pox    had 4 or 5 times as child   History of COVID-19 04/29/2020   mild all symptoms resolved   IBS (irritable bowel syndrome)    Irritable bowel    Leg fracture, left    Psychogenic nonepileptic seizure    last seizure 1 month ago per pt on 01-25-2022 saw dr ka aquino  neurology   Seizure [facility])    last seizure 12-28-2021 per pt on 01-25-2022   Self-injurious behavior    Shingles    intermittent outbreaks   Ulcerative colitis [facility])    Wears glasses     Past Surgical History:  Procedure Laterality Date   APPENDECTOMY     BIOPSY  06/06/2020   Procedure: BIOPSY;  Surgeon: Avram Lupita BRAVO, MD;  Location: [facility] ENDOSCOPY;  Service: Endoscopy;;   COLONOSCOPY WITH PROPOFOL  N/A 06/06/2020   Procedure: COLONOSCOPY WITH PROPOFOL ;  Surgeon: Avram Lupita BRAVO, MD;  Location: [facility] ENDOSCOPY;  Service: Endoscopy;  Laterality: N/A;   DIAGNOSTIC LAPAROSCOPY WITH REMOVAL OF ECTOPIC PREGNANCY Right 12/11/2021   Procedure: LAPAROSCOPIC RIGHT SALPINGECTOMY WITH REMOVAL OF ECTOPIC PREGNANCY, lysis of adhesions;  Surgeon: Kandyce Sor, MD;  Location: [facility] OR;  Service: Gynecology;  Laterality: Right;   ESOPHAGOGASTRODUODENOSCOPY (EGD) WITH PROPOFOL  N/A 06/06/2020   Procedure: ESOPHAGOGASTRODUODENOSCOPY (EGD) WITH PROPOFOL ;  Surgeon: Avram Lupita BRAVO, MD;  Location: [facility] ENDOSCOPY;  Service: Endoscopy;  Laterality: N/A;   HAND SURGERY Left    x3   LIGATION OF INTERNAL FISTULA TRACT N/A 03/29/2023   Procedure:  LIGATION OF INTERNAL FISTULA TRACT;  Surgeon: Debby Hila, MD;  Location: [facility] ORS;  Service: General;  Laterality: N/A;   PLACEMENT OF SETON N/A 01/30/2022   Procedure: PLACEMENT OF SETON;  Surgeon: Debby Hila, MD;  Location: [facility] ORS;  Service: General;  Laterality: N/A;   RECTAL EXAM UNDER ANESTHESIA N/A 01/30/2022   Procedure: ANAL EXAM UNDER ANESTHESIA;  Surgeon: Debby Hila, MD;  Location: [facility] ORS;  Service: General;  Laterality: N/A;    Current Medications: Current Facility-Administered Medications  Medication Dose Route Frequency Provider Last Rate Last Admin   acetaminophen  (TYLENOL ) tablet 650 mg  650 mg Oral Q6H PRN Penn, Reymundo, NP   650 mg at 05/01/24 1001   alum & mag hydroxide-simeth (MAALOX/MYLANTA) 200-200-20 MG/5ML suspension 30 mL  30 mL Oral Q4H PRN Penn, Reymundo, NP       cariprazine  (VRAYLAR ) capsule 1.5 mg  1.5 mg Oral Daily Cleotilde Hoy HERO, NP   1.5 mg at 05/01/24 0844   chlorproMAZINE (THORAZINE) injection 50 mg  50 mg Intramuscular QID PRN Jadapalle, Sree, MD       chlorproMAZINE (THORAZINE) tablet 100 mg  100 mg Oral QID PRN Jadapalle, Sree, MD   100 mg at 05/01/24 1453   cholestyramine  light (PREVALITE ) packet 4 g  4 g Oral BID Cleotilde Hoy HERO, NP   4 g at 05/01/24 0845   clonazePAM  (KLONOPIN ) tablet 1 mg  1 mg Oral BID PRN Cleotilde Hoy HERO, NP   1 mg at 05/01/24 9147   cyclobenzaprine  (FLEXERIL ) tablet 10 mg  10 mg Oral Daily PRN Cleotilde Hoy HERO, NP   10 mg at 05/01/24 9147   desvenlafaxine  (PRISTIQ ) 24 hr tablet 50 mg  50 mg Oral Daily Lenon Elsie HERO, RPH   50 mg at 04/30/24 1835   haloperidol  (HALDOL ) tablet 5 mg  5 mg Oral TID PRN Sammy Reymundo, NP   5 mg at 05/01/24 1110   And   diphenhydrAMINE  (BENADRYL ) capsule 50 mg  50 mg Oral TID PRN Sammy Reymundo, NP   50 mg at 04/30/24 1626   haloperidol  lactate (HALDOL ) injection 10 mg  10 mg Intramuscular TID PRN Sammy Reymundo, NP       And   diphenhydrAMINE  (BENADRYL ) injection 50 mg  50 mg  Intramuscular TID PRN Sammy Reymundo, NP  And   LORazepam  (ATIVAN ) injection 2 mg  2 mg Intramuscular TID PRN Sammy Molder, NP       haloperidol  lactate (HALDOL ) injection 5 mg  5 mg Intramuscular TID PRN Sammy Molder, NP       And   diphenhydrAMINE  (BENADRYL ) injection 50 mg  50 mg Intramuscular TID PRN Sammy Molder, NP   50 mg at 05/01/24 1111   And   LORazepam  (ATIVAN ) injection 2 mg  2 mg Intramuscular TID PRN Sammy Molder, NP   2 mg at 04/30/24 1659   lamoTRIgine  (LAMICTAL ) tablet 200 mg  200 mg Oral QHS Cleotilde Hoy HERO, NP   200 mg at 04/30/24 9045   lidocaine  (LIDODERM ) 5 % 1 patch  1 patch Transdermal Daily PRN Cleotilde Hoy HERO, NP   1 patch at 04/30/24 1319   magnesium  hydroxide (MILK OF MAGNESIA) suspension 15 mL  15 mL Oral Daily PRN Sammy Molder, NP       melatonin tablet 5 mg  5 mg Oral QHS Cleotilde Hoy HERO, NP   5 mg at 04/30/24 2052   ondansetron  (ZOFRAN -ODT) disintegrating tablet 4 mg  4 mg Oral Q8H PRN Cleotilde Hoy HERO, NP       pantoprazole  (PROTONIX ) EC tablet 40 mg  40 mg Oral Daily Penn, Molder, NP   40 mg at 05/01/24 0844   propranolol  (INDERAL ) tablet 10 mg  10 mg Oral TID PRN Cleotilde Hoy HERO, NP   10 mg at 04/30/24 1431   traZODone  (DESYREL ) tablet 100 mg  100 mg Oral QHS Penn, Molder, NP   100 mg at 04/30/24 2052   Vitamin D  (Ergocalciferol ) (DRISDOL ) 1.25 MG (50000 UNIT) capsule 50,000 Units  50,000 Units Oral Weekly Cleotilde Hoy HERO, NP   50,000 Units at 05/01/24 0845    Lab Results:  Results for orders placed or performed during the hospital encounter of 04/29/24 (from the past 48 hours)  Hemoglobin A1c     Status: None   Collection Time: 05/01/24  8:00 AM  Result Value Ref Range   Hgb A1c MFr Bld 5.0 4.8 - 5.6 %    Comment: (NOTE) Diagnosis of Diabetes The following HbA1c ranges recommended by the American Diabetes Association (ADA) may be used as an aid in the diagnosis of diabetes mellitus.  Hemoglobin             Suggested A1C NGSP%               Diagnosis  <5.7                   Non Diabetic  5.7-6.4                Pre-Diabetic  >6.4                   Diabetic  <7.0                   Glycemic control for                       adults with diabetes.     Mean Plasma Glucose 96.8 mg/dL    Comment: Performed at [facility], [address], [place], [place] [address]  Lipid panel     Status: Abnormal   Collection Time: 05/01/24  8:00 AM  Result Value Ref Range   Cholesterol 229 (H) 0 - 200 mg/dL   Triglycerides 828 (H) <150 mg/dL  HDL 38 (L) >40 mg/dL   Total CHOL/HDL Ratio 6.0 RATIO   VLDL 34 0 - 40 mg/dL   LDL Cholesterol 842 (H) 0 - 99 mg/dL    Comment:        Total Cholesterol/HDL:CHD Risk Coronary Heart Disease Risk Table                     Men   Women  1/2 Average Risk   3.4   3.3  Average Risk       5.0   4.4  2 X Average Risk   9.6   7.1  3 X Average Risk  23.4   11.0        Use the calculated Patient Ratio above and the CHD Risk Table to determine the patient's CHD Risk.        ATP III CLASSIFICATION (LDL):  <100     mg/dL   Optimal  899-870  mg/dL   Near or Above                    Optimal  130-159  mg/dL   Borderline  839-810  mg/dL   High  >809     mg/dL   Very High Performed at [facility], [address] [place], [place] [address]     Blood Alcohol level:  Lab Results  Component Value Date   [facility] <15 04/28/2024   ETH <10 11/10/2023    Metabolic Disorder Labs: Lab Results  Component Value Date   HGBA1C 5.0 05/01/2024   MPG 96.8 05/01/2024   MPG 108 07/16/2023   Lab Results  Component Value Date   PROLACTIN 16.1 09/20/2023   Lab Results  Component Value Date   CHOL 229 (H) 05/01/2024   TRIG 171 (H) 05/01/2024   HDL 38 (L) 05/01/2024   CHOLHDL 6.0 05/01/2024   VLDL 34 05/01/2024   LDLCALC 157 (H) 05/01/2024   LDLCALC 151 (H) 11/10/2023       Psychiatric Specialty Exam:  Presentation  General Appearance:  Fairly Groomed; Casual  Eye  Contact: Other (comment) (good at times to avoidant and refusing when upset)  Speech: Normal Rate; Other (comment) (becoming loud and verbally aggressive at times of escalation)  Speech Volume: Increased    Mood and Affect  Mood: Irritable; Dysphoric; Labile; Euthymic  Affect: Full Range; Tearful; Inappropriate   Thought Process  Thought Processes: Linear  Descriptions of Associations:Tangential  Orientation:Full (Time, Place and Person)  Thought Content:Rumination; Obsessions  Hallucinations:Hallucinations: None  Ideas of Reference:Percusatory  Suicidal Thoughts:Suicidal Thoughts: Yes, Active SI Active Intent and/or Plan: Without Intent  Homicidal Thoughts:Homicidal Thoughts: No   Sensorium  Memory: Immediate Good; Remote Good  Judgment: poor   Insight: poor    Executive Functions  Concentration: Good  Attention Span: Good  Recall: Good  Fund of Knowledge: Good  Language: Good   Psychomotor Activity  Psychomotor Activity: Psychomotor Activity: Normal  Musculoskeletal: Strength & Muscle Tone: within normal limits Gait & Station: normal Assets  Assets: Housing; Advertising copywriter; Social Support; Manufacturing systems engineer; Desire for Improvement; Financial Resources/Insurance; Intimacy    Physical Exam: Physical Exam ROS Blood pressure 110/65, pulse 78, temperature (!) 97.4 F (36.3 C), resp. rate 14, height 5' 7 (1.702 m), weight 122.5 kg, SpO2 99%. Body mass index is 42.3 kg/m.  Diagnosis: Principal Problem:   Bipolar disorder (HCC) Active Problems:   Borderline personality disorder (HCC)   Acute posttraumatic stress disorder   Cannabis abuse  PLAN: Safety and Monitoring:  -- Voluntary admission to inpatient psychiatric unit for safety, stabilization and treatment  -- Daily contact with patient to assess and evaluate symptoms and progress in treatment  -- Patient's case to be discussed in multi-disciplinary team  meeting  -- Observation Level : q15 minute checks  -- Vital signs:  q12 hours  -- Precautions: suicide, elopement, and assault -- Encouraged patient to participate in unit milieu and in scheduled group therapies   2. Psychiatric Diagnoses and Treatment:   Bipolar disorder (HCC) BPD   Assessment: Patient presentation is meeting diagnostic criteria for Borderline Personality Disorder and Major Depressive Disorder, Recurrent, Severe, based on longstanding mood instability, affective dysregulation, self-harm behaviors, intrusive thoughts, and history of early trauma. Patient reports a pattern of unstable and intense interpersonal relationships, identity disturbance, impulsivity, recurrent suicidal behavior and gestures, affective instability, and chronic feelings of emptiness, further supporting the diagnosis of Borderline Personality Disorder. Presentation is also consistent with historical diagnosis of Bipolar II Disorder, primarily depressive episodes with mixed symptoms. Concerns raised regarding potential misdiagnosis of Bipolar I Disorder given the absence of clear manic symptoms. Further clinical assessment is needed for diagnostic clarity. There is notable impairment in daily functioning, high emotional reactivity, and chronic suicidal ideation. Risk factors include intrusive suicidal thoughts, history of overdose attempt, trauma history, emotional lability, and limited outpatient stability. Protective factors include employment, education, presence of a significant other, and engagement in care.  Plan: It is the judgment of the undersigned that patient is appropriate for treatment in this acute psychiatric setting related to concern for safety in the context of suicidal thoughts, emotional dysregulation, and self-injurious behaviors.Given continued significant mood lability and ruminative thought content, patient was started on Vraylar  1.5 mg by mouth daily to target mood instability and  intrusive thoughts. Presentation currently aligns with historical diagnosis of Bipolar II Disorder, primarily depressive episodes with mixed features. Further clinical assessment is required for diagnostic clarity. At this time, the addition of an atypical antipsychotic is appropriate to support stabilization.Patient denies hallucinations, paranoia, and delusions. She does not appear psychotic. She appears calm and relaxed and engages brightly, which is incongruent with her reported internal distress.Patient acknowledges history of Borderline Personality Disorder and previously participated in intensive outpatient DBT therapy.Medication education is provided to include risks, benefits, and side effects. Patient verbalized understanding and agrees to plan of care.   Medications:  Pristiq  [dose not specified] Klonopin  1 mg by mouth twice daily as needed for anxiety Lamictal  200 mg by mouth daily Propranolol  10 mg by mouth three times daily as needed for anxiety Trazodone  100 mg by mouth at bedtime Melatonin 5 mg by mouth at bedtime (initiated in hospital) Vraylar  1.5 mg by mouth daily (initiated in hospital)        3. Medical Issues Being Addressed: No acute needs identified    4. Discharge Planning:   -- Social work and case management to assist with discharge planning and identification of hospital follow-up needs prior to discharge  -- Estimated LOS: 3-4 days  Hoy CHRISTELLA Pinal, NP 05/01/2024, 5:20 PM

## 2024-05-01 NOTE — BH IP Treatment Plan (Signed)
 Interdisciplinary Treatment and Diagnostic Plan Update  05/01/2024 Time of Session: 10:50 AM Barbara Orozco MRN: [id number]  Principal Diagnosis: Bipolar disorder [facility])  Secondary Diagnoses: Principal Problem:   Bipolar disorder (HCC) Active Problems:   Borderline personality disorder (HCC)   Acute posttraumatic stress disorder   Cannabis abuse   Current Medications:  Current Facility-Administered Medications  Medication Dose Route Frequency Provider Last Rate Last Admin   acetaminophen  (TYLENOL ) tablet 650 mg  650 mg Oral Q6H PRN Penn, Reymundo, NP   650 mg at 05/01/24 1001   alum & mag hydroxide-simeth (MAALOX/MYLANTA) 200-200-20 MG/5ML suspension 30 mL  30 mL Oral Q4H PRN Penn, Reymundo, NP       cariprazine  (VRAYLAR ) capsule 1.5 mg  1.5 mg Oral Daily Cleotilde Hoy HERO, NP   1.5 mg at 05/01/24 0844   cholestyramine  light (PREVALITE ) packet 4 g  4 g Oral BID Cleotilde Hoy HERO, NP   4 g at 05/01/24 0845   clonazePAM  (KLONOPIN ) tablet 1 mg  1 mg Oral BID PRN Cleotilde Hoy HERO, NP   1 mg at 05/01/24 9147   cyclobenzaprine  (FLEXERIL ) tablet 10 mg  10 mg Oral Daily PRN Cleotilde Hoy HERO, NP   10 mg at 05/01/24 9147   desvenlafaxine  (PRISTIQ ) 24 hr tablet 50 mg  50 mg Oral Daily Lenon Elsie HERO, RPH   50 mg at 04/30/24 1835   haloperidol  (HALDOL ) tablet 5 mg  5 mg Oral TID PRN Sammy Reymundo, NP   5 mg at 04/30/24 1626   And   diphenhydrAMINE  (BENADRYL ) capsule 50 mg  50 mg Oral TID PRN Sammy Reymundo, NP   50 mg at 04/30/24 1626   haloperidol  lactate (HALDOL ) injection 10 mg  10 mg Intramuscular TID PRN Sammy Reymundo, NP       And   diphenhydrAMINE  (BENADRYL ) injection 50 mg  50 mg Intramuscular TID PRN Penn, Reymundo, NP       And   LORazepam  (ATIVAN ) injection 2 mg  2 mg Intramuscular TID PRN Sammy Reymundo, NP       haloperidol  lactate (HALDOL ) injection 5 mg  5 mg Intramuscular TID PRN Sammy Reymundo, NP       And   diphenhydrAMINE  (BENADRYL ) injection 50 mg  50 mg Intramuscular TID PRN  Sammy Reymundo, NP       And   LORazepam  (ATIVAN ) injection 2 mg  2 mg Intramuscular TID PRN Sammy Reymundo, NP   2 mg at 04/30/24 1659   lamoTRIgine  (LAMICTAL ) tablet 200 mg  200 mg Oral QHS Cleotilde Hoy HERO, NP   200 mg at 04/30/24 9045   lidocaine  (LIDODERM ) 5 % 1 patch  1 patch Transdermal Daily PRN Cleotilde Hoy HERO, NP   1 patch at 04/30/24 1319   magnesium  hydroxide (MILK OF MAGNESIA) suspension 15 mL  15 mL Oral Daily PRN Penn, Reymundo, NP       melatonin tablet 5 mg  5 mg Oral QHS Cleotilde Hoy HERO, NP   5 mg at 04/30/24 2052   ondansetron  (ZOFRAN -ODT) disintegrating tablet 4 mg  4 mg Oral Q8H PRN Cleotilde Hoy HERO, NP       pantoprazole  (PROTONIX ) EC tablet 40 mg  40 mg Oral Daily Penn, Cicely, NP   40 mg at 05/01/24 0844   propranolol  (INDERAL ) tablet 10 mg  10 mg Oral TID PRN Cleotilde Hoy HERO, NP   10 mg at 04/30/24 1431   traZODone  (DESYREL ) tablet 100 mg  100 mg Oral QHS Penn, [place],  NP   100 mg at 04/30/24 2052   Vitamin D  (Ergocalciferol ) (DRISDOL ) 1.25 MG (50000 UNIT) capsule 50,000 Units  50,000 Units Oral Weekly Cleotilde Hoy HERO, NP   50,000 Units at 05/01/24 0845   PTA Medications: Medications Prior to Admission  Medication Sig Dispense Refill Last Dose/Taking   desvenlafaxine  (PRISTIQ ) 50 MG 24 hr tablet Take 1 tablet (50 mg total) by mouth daily. 30 tablet 0 Past Week   DIALYVITE VITAMIN D3 MAX 1.25 MG (50000 UT) TABS TAKE 1 TABLET BY MOUTH ONCE A WEEK (Patient taking differently: Take 50,000 Units by mouth every Friday.) 4 tablet 11 Past Week   lamoTRIgine  (LAMICTAL ) 150 MG tablet Take 1 tablet (150 mg total) by mouth daily. 30 tablet 0 Past Week   lidocaine  (LIDODERM ) 5 % Place 1 patch onto the skin daily as needed (For back pain). Remove & Discard patch within 12 hours or as directed by MD   Past Month   ondansetron  (ZOFRAN -ODT) 4 MG disintegrating tablet DISSOLVE 1 TABLET IN MOUTH EVERY 8 HOURS AS NEEDED FOR NAUSEA OR VOMITING 18 tablet 0 Past Week   pantoprazole   (PROTONIX ) 40 MG tablet Take 1 tablet (40 mg total) by mouth daily.   Past Week   traZODone  (DESYREL ) 100 MG tablet Take 1 tablet (100 mg total) by mouth at bedtime as needed for sleep. 30 tablet 0 Past Week    Patient Stressors: Medication change or noncompliance   Traumatic event    Patient Strengths: Ability for insight  Motivation for treatment/growth   Treatment Modalities: Medication Management, Group therapy, Case management,  1 to 1 session with clinician, Psychoeducation, Recreational therapy.   Physician Treatment Plan for Primary Diagnosis: Bipolar disorder (HCC) Long Term Goal(s): Improvement in symptoms so as ready for discharge   Short Term Goals: Ability to identify changes in lifestyle to reduce recurrence of condition will improve  Medication Management: Evaluate patient's response, side effects, and tolerance of medication regimen.  Therapeutic Interventions: 1 to 1 sessions, Unit Group sessions and Medication administration.  Evaluation of Outcomes: Not Met  Physician Treatment Plan for Secondary Diagnosis: Principal Problem:   Bipolar disorder (HCC) Active Problems:   Borderline personality disorder (HCC)   Acute posttraumatic stress disorder   Cannabis abuse  Long Term Goal(s): Improvement in symptoms so as ready for discharge   Short Term Goals: Ability to identify changes in lifestyle to reduce recurrence of condition will improve     Medication Management: Evaluate patient's response, side effects, and tolerance of medication regimen.  Therapeutic Interventions: 1 to 1 sessions, Unit Group sessions and Medication administration.  Evaluation of Outcomes: Not Met   RN Treatment Plan for Primary Diagnosis: Bipolar disorder (HCC) Long Term Goal(s): Knowledge of disease and therapeutic regimen to maintain health will improve  Short Term Goals: Ability to verbalize frustration and anger appropriately will improve, Ability to demonstrate self-control,  Ability to participate in decision making will improve, Ability to verbalize feelings will improve, Ability to disclose and discuss suicidal ideas, Ability to identify and develop effective coping behaviors will improve, and Compliance with prescribed medications will improve  Medication Management: RN will administer medications as ordered by provider, will assess and evaluate patient's response and provide education to patient for prescribed medication. RN will report any adverse and/or side effects to prescribing provider.  Therapeutic Interventions: 1 on 1 counseling sessions, Psychoeducation, Medication administration, Evaluate responses to treatment, Monitor vital signs and CBGs as ordered, Perform/monitor CIWA, COWS, AIMS and Fall Risk screenings as  ordered, Perform wound care treatments as ordered.  Evaluation of Outcomes: Not Met   LCSW Treatment Plan for Primary Diagnosis: Bipolar disorder [facility]) Long Term Goal(s): Safe transition to appropriate next level of care at discharge, Engage patient in therapeutic group addressing interpersonal concerns.  Short Term Goals: Engage patient in aftercare planning with referrals and resources, Increase social support, Increase ability to appropriately verbalize feelings, Increase emotional regulation, Facilitate acceptance of mental health diagnosis and concerns, Facilitate patient progression through stages of change regarding substance use diagnoses and concerns, Identify triggers associated with mental health/substance abuse issues, and Increase skills for wellness and recovery  Therapeutic Interventions: Assess for all discharge needs, 1 to 1 time with Social worker, Explore available resources and support systems, Assess for adequacy in community support network, Educate family and significant other(s) on suicide prevention, Complete Psychosocial Assessment, Interpersonal group therapy.  Evaluation of Outcomes: Not Met   Progress in  Treatment: Attending groups: Yes. Participating in groups: Yes. Taking medication as prescribed: Yes. Toleration medication: Yes. Family/Significant other contact made: No, will contact:  CSW to contact once permission is graned.  Patient understands diagnosis: Yes. Discussing patient identified problems/goals with staff: Yes. Medical problems stabilized or resolved: Yes. and No. Denies suicidal/homicidal ideation: Yes. Issues/concerns per patient self-inventory: No. Other: None  New problem(s) identified: No, Describe:  None  New Short Term/Long Term Goal(s): detox, elimination of symptoms of psychosis, medication management for mood stabilization; elimination of SI thoughts; development of comprehensive mental wellness/sobriety plan.    Patient Goals:  I just want to feel better.  Discharge Plan or Barriers: CSW to assist with the development of appropriate discharge plan.    Reason for Continuation of Hospitalization: Aggression Anxiety Depression Medical Issues Medication stabilization Suicidal ideation  Estimated Length of Stay: 1-7 days.   Last 3 [place] Suicide Severity Risk Score: Flowsheet Row Admission (Current) from 04/29/2024 in [facility] INPATIENT BEHAV[facility]AL [facility]ICINE ED from 04/28/2024 in [facility] Emergency Department at [facility] ED from 01/05/2024 in [facility] Emergency Department at [facility]  C-SSRS RISK CATEGORY Low Risk Moderate Risk No Risk    Last [facility] 2/9 Scores:    09/11/2023    4:06 PM 09/06/2023    8:12 AM 08/16/2023    8:48 AM  Depression screen PHQ 2/9  Decreased Interest 0 0 2  Down, Depressed, Hopeless 0 0 2  PHQ - 2 Score 0 0 4  Altered sleeping  0 3  Tired, decreased energy  0 3  Change in appetite  0 2  Feeling bad or failure about yourself   0 1  Trouble concentrating  0 3  Moving slowly or fidgety/restless  0 3  Suicidal thoughts  0 1  PHQ-9 Score  0 20  Difficult doing work/chores   Very difficult    Scribe for  Treatment Team: [name], LCSW 05/01/2024 11:03 AM

## 2024-05-01 NOTE — Plan of Care (Signed)
   Problem: Education: Goal: Emotional status will improve Outcome: Progressing Goal: Mental status will improve Outcome: Progressing

## 2024-05-01 NOTE — Plan of Care (Signed)
  Problem: Education: Goal: Knowledge of [facility] Education information/materials will improve 05/01/2024 1510 by Marty Eleanor NOVAK, RN Outcome: Not Progressing 05/01/2024 1509 by Marty Eleanor NOVAK, RN Outcome: Progressing Goal: Emotional status will improve 05/01/2024 1510 by Marty Eleanor NOVAK, RN Outcome: Not Progressing 05/01/2024 1509 by Marty Eleanor NOVAK, RN Outcome: Progressing Goal: Mental status will improve 05/01/2024 1510 by Marty Eleanor NOVAK, RN Outcome: Not Progressing 05/01/2024 1509 by Marty Eleanor NOVAK, RN Outcome: Progressing Goal: Verbalization of understanding the information provided will improve Outcome: Not Progressing   Problem: Activity: Goal: Interest or engagement in activities will improve Outcome: Not Progressing Goal: Sleeping patterns will improve Outcome: Not Progressing   Problem: Coping: Goal: Ability to verbalize frustrations and anger appropriately will improve Outcome: Not Progressing Goal: Ability to demonstrate self-control will improve Outcome: Not Progressing

## 2024-05-01 NOTE — BHH Counselor (Signed)
 Patient conducted phone screening with [facility]. Patient was informed that admissions coordinator would touch base with CSW later in the evening.   CSW to continue to assess.   Austin Herd, MSW, LCSWA 05/01/2024 11:29 AM

## 2024-05-01 NOTE — Progress Notes (Signed)
   05/01/24 1114  [place] Violence Checklist (Document every shift between 4 & 7)  Confusion 0  Irritable 1  Boisterous 0  Physical threats 0  Verbal threats 0  Attacking objects 1  Total Score 2  Violence Risk Category Moderate Risk  Violence Prevention Guidelines *See Row Information* Moderate Violence Risk interventions implemented  Violence Risk Additional Interventions Medication;Supportive listening;Decreased stimulation   Pt to the nurse station hyperventilating states she is agitated and needs something to help calm her down before she 'Blows up pt was pulling her hair and tearful.  Offered PO haldol  and benadryl . Pt states PO benadryl  did not help her yesterday and requested IM. RN stayed with pt until she was calm and provided emotional support. Pt states she is thinking about her step children that she lost.

## 2024-05-01 NOTE — Progress Notes (Signed)
   05/01/24 1420 05/01/24 1453  [place] Violence Checklist (Document every shift between 4 & 7)  Confusion 0 0  Irritable 1 1  Boisterous 1 1  Physical threats 0 0  Verbal threats 0 0  Attacking objects 1 0  Total Score 3 2  Violence Risk Category High Risk Moderate Risk  Violence Prevention Guidelines *See Row Information* High Violence Risk interventions implemented Moderate Violence Risk interventions implemented  Violence Risk Additional Interventions Verbal de-escalation;Supportive listening;Decreased stimulation Medication   Pt screaming crying banging head on wall. RN and MHT stayed with patient provided emotional support until patient stopped. Notified Miller Np and Dr Donnelly. See new orders placed and MAR.Currently patient is calm and resting.

## 2024-05-01 NOTE — Group Note (Signed)
 Date:  05/01/2024 Time:  4:10 PM  Group Topic/Focus:  Coping With Mental Health Crisis:   The purpose of this group is to help patients identify strategies for coping with mental health crisis.  Group discusses possible causes of crisis and ways to manage them effectively.    Participation Level:  Active  Participation Quality:  Appropriate  Affect:  Appropriate  Cognitive:  Appropriate  Insight: Appropriate  Engagement in Group:  Engaged  Modes of Intervention:  Activity  Additional Comments:    Barbara Orozco 05/01/2024, 4:10 PM

## 2024-05-01 NOTE — BHH Suicide Risk Assessment (Signed)
 BHH INPATIENT:  Family/Significant Other Suicide Prevention Education  Suicide Prevention Education:  Contact Attempts: [name], [phone], Partner, (has been identified by the patient as the family member/significant other with whom the patient will be residing, and identified as the person(s) who will aid the patient in the event of a mental health crisis.  With written consent from the patient, two attempts were made to provide suicide prevention education, prior to and/or following the patient's discharge.  We were unsuccessful in providing suicide prevention education.  A suicide education pamphlet was given to the patient to share with family/significant other.  Date and time of first attempt:06/27, 1:24 PM  Date and time of second attempt: Second attempt is needed.   Babs Dabbs M Tynisha Ogan 05/01/2024, 1:24 PM

## 2024-05-01 NOTE — Plan of Care (Deleted)
   Problem: Education: Goal: Knowledge of [facility] Education information/materials will improve Outcome: Progressing Goal: Emotional status will improve Outcome: Progressing Goal: Mental status will improve Outcome: Progressing

## 2024-05-01 NOTE — Progress Notes (Deleted)
   05/01/24 0900  Charting Type  Charting Type Shift assessment  Safety Check Verification  Has the RN verified the 15 minute safety check completion? Yes  Neurological  Neuro (WDL) WDL  HEENT  HEENT (WDL) X  R Eye Eyeglasses  L Eye Eyeglasses  Respiratory  Respiratory (WDL) WDL  Cardiac  Cardiac (WDL) WDL  Vascular  Vascular (WDL) WDL  Integumentary  Integumentary (WDL) X  Braden Scale (Ages 8 and up)  Sensory Perceptions 4  Moisture 4  Activity 4  Mobility 4  Nutrition 3  Friction and Shear 3  Braden Scale Score 22  Musculoskeletal  Musculoskeletal (WDL) WDL  Gastrointestinal  Gastrointestinal (WDL) WDL  Last BM Date  04/30/24  GU Assessment  Genitourinary (WDL) WDL  Neurological  Level of Consciousness Alert

## 2024-05-01 NOTE — Progress Notes (Signed)
 Pt calm and pleasant during assessment denying HI/AVH. Pt endorses passive SI, verbally contracts for safety. Pt observed interacting appropriately with staff and peers on the unit. Pt compliant with medication administration per MD orders. Pt given education, support, and encouragement to be active in her treatment plan. Pt being monitored Q 15 minutes for safety per unit protocol, remains safe on the unit

## 2024-05-01 NOTE — BHH Counselor (Signed)
 Patient expressed interest in [facility] for the IOP program.   CSW touched base with program and patient has phone assessment with [facility] Monday 05/04/24 at 10 AM for IOP program.   CSW to continue to assess.   Fisher Hargadon, MSW, LCSWA 05/01/2024 2:20 PM

## 2024-05-01 NOTE — Group Note (Signed)
 Date:  05/01/2024 Time:  11:33 AM  Group Topic/Focus:  Healthy Communication:   The focus of this group is to discuss communication, barriers to communication, as well as healthy ways to communicate with others.    Participation Level:  Did Not Attend   Deitra Clap [facility] 05/01/2024, 11:33 AM

## 2024-05-01 NOTE — BHH Suicide Risk Assessment (Signed)
 B[facility] INPATIENT:  Family/Significant Other Suicide Prevention Education  Suicide Prevention Education:  Education Completed; Barbara Orozco, [phone], Partner, has been identified by the patient as the family member/significant other with whom the patient will be residing, and identified as the person(s) who will aid the patient in the event of a mental health crisis (suicidal ideations/suicide attempt).  With written consent from the patient, the family member/significant other has been provided the following suicide prevention education, prior to the and/or following the discharge of the patient.  The suicide prevention education provided includes the following: Suicide risk factors Suicide prevention and interventions National Suicide Hotline telephone number [facility] assessment telephone number [facility] Emergency Assistance 911 [facility] and/or Residential Mobile Crisis Unit telephone number  Request made of family/significant other to: Remove weapons (e.g., guns, rifles, knives), all items previously/currently identified as safety concern.   Remove drugs/medications (over-the-counter, prescriptions, illicit drugs), all items previously/currently identified as a safety concern.  The family member/significant other verbalizes understanding of the suicide prevention education information provided.  The family member/significant other agrees to remove the items of safety concern listed above.  According to patient's partner, patient has "a lot going on" and "hasn't had the opportunity to deal with everything." Partner reports that he believes the patient "misses her old life." Partner reports that when something goes wrong for the patient she "goes to the extreme and can't control her crying." The partner reports this causes the patient's suicidal thoughts and it can last "all day." Partner reports that the patient will "bang her head against the wall." Partner reports  that the patient was in a disagreement with her former best friend "who abandoned her" and after seeing posts on social media, had taken them personally and it triggered her current episode. Partner reports he doesn't believe the patient "wants to die. Her life is just changing." Partner believes that as of now, the patient is a danger to herself. He reports that she does not have access to any weapons and can return home at discharge. Partner expressed no other safety concerns.   Barbara Orozco M Barbara Orozco 05/01/2024, 3:08 PM

## 2024-05-01 NOTE — Progress Notes (Signed)
   05/01/24 0900 05/01/24 1420  Psych Admission Type (Psych Patients Only)  Admission Status Involuntary  --   Psychosocial Assessment  Patient Complaints Anxiety;Depression;Self-harm thoughts (depression 8/10 anx 8/10) Agitation;Crying spells;Depression;Self-harm behaviors  Eye Contact Fair  --   Facial Expression Anxious  --   Affect Anxious  --   Speech Logical/coherent  --   Interaction Assertive  --   Motor Activity Other (Comment) (appropriate)  --   Appearance/Hygiene Unremarkable  --   Behavior Characteristics Cooperative Agitated;Irritable  Mood Anxious;Depressed  --   Thought Process  Coherency WDL  --   Content WDL  --   Delusions None reported or observed  --   Perception WDL  --   Hallucination None reported or observed  --   Judgment WDL  --   Confusion None  --   Danger to Self  Current suicidal ideation? Passive (pt states all the time, nothing new for me I do not have a plan or anything)  --   Agreement Not to Harm Self Yes  --   Description of Agreement verbal  --

## 2024-05-02 MED ORDER — CARIPRAZINE HCL 1.5 MG PO CAPS
3.0000 mg | ORAL_CAPSULE | Freq: Every day | ORAL | Status: DC
  Administered 2024-05-03 – 2024-05-04 (×2): 3 mg via ORAL
  Filled 2024-05-02 (×2): qty 2

## 2024-05-02 NOTE — Plan of Care (Signed)
   Problem: Education: Goal: Emotional status will improve Outcome: Not Progressing Goal: Mental status will improve Outcome: Not Progressing Goal: Verbalization of understanding the information provided will improve Outcome: Not Progressing

## 2024-05-02 NOTE — Group Note (Signed)
 Date:  05/02/2024 Time:  2:58 PM  Group Topic/Focus:  Self Care:   The focus of this group is to help patients understand the importance of self-care in order to improve or restore emotional, physical, spiritual, interpersonal, and financial health. Wellness Toolbox:   The focus of this group is to discuss various aspects of wellness, balancing those aspects and exploring ways to increase the ability to experience wellness.  Patients will create a wellness toolbox for use upon discharge.    Participation Level:  Did Not Attend  Participation Quality:    Affect:    Cognitive:    Insight:   Engagement in Group:    Modes of Intervention:    Additional Comments:    Barbara Orozco 05/02/2024, 2:58 PM

## 2024-05-02 NOTE — Plan of Care (Signed)

## 2024-05-02 NOTE — Progress Notes (Signed)
 05/01/2024 5:20 PM Barbara Orozco  MRN:  [id number]     Subjective:  Chart reviewed, case discussed in multidisciplinary meeting, patient seen during rounds.    Patient is a 38 year old female with a past medical history of asthma, endometriosis, ulcerative colitis, and GERD, and a past psychiatric history of Bipolar I Disorder, Borderline Personality Disorder, Bipolar II Disorder, ADHD, and Cocaine Use Disorder. She presents voluntarily for psychiatric evaluation due to intrusive thoughts of hanging herself and reported self-injurious behaviors including banging her head on the wall and pulling her hair.  Per Chart Review Patient has a history of multiple inpatient psychiatric admissions, most recently in January 2025. She has received outpatient treatment with [facility] for psychotherapy. Prior psychiatric medication trials include Lamictal , Trazodone , Desyrel , Effexor  (Faxine), Buspirone , Klonopin , Topamax , Wellbutrin, Depakote, Spravato, and Lithium. She has been diagnosed with Bipolar I Disorder, Borderline Personality Disorder, Bipolar II Disorder, ADHD, and Cocaine Use Disorder. In the emergency department, she endorsed a mix of symptoms including depression, anxiety, panic, obsessive-compulsive tendencies, and emotional dysregulation. She appeared emotionally labile.   Patient seen today for follow-up psychiatric evaluation. She has been seen multiple times in interview with this provider and typically engages appropriately until the topic of increasing PRN benzodiazepines is raised. At that point, patient becomes tearful, hopeless, and negativistic, stating, "You just don't want to help me." The plan of care is reviewed with the patient, including continued mood stabilizer therapy and the recent initiation of Vraylar  for mood lability and impulsivity.  Today, her mood remains erratic, and she is noted to be emotionally reactive and demanding. This provider was called to the patient's room  during a meeting with social work, where discharge planning and outpatient program options were being discussed. The patient became visibly upset, refused eye contact, cried, yelled, and threatened self-harm. Floor staff implemented close monitoring. An as-needed medication was ordered by the attending psychiatrist to address acute agitation and emotional dysregulation. Limited assessment as patient refusing to participate in formal assessment and review of symptoms due to extreme lability.    Assessment: Patient continues to demonstrate significant mood instability, poor distress tolerance, and impulsivity in the context of Borderline Personality Disorder traits and underlying affective disorder. She remains at risk for self-harm and is not yet appropriate for discharge.     Sleep: Fair   Appetite:  Good   Past Psychiatric History: see h&P Family History:       Family History  Problem Relation Age of Onset   Colitis Mother     Ulcerative colitis Mother     Heart disease Mother     Endocrine tumor Mother          pituitary tumor   Colitis Father     Ulcerative colitis Father     Diabetes Father     Endocrine tumor Maternal Grandmother          piturtary tumor   Colitis Paternal Grandmother     Ulcerative colitis Paternal Grandmother     Stomach cancer Paternal Aunt     Colon cancer Neg Hx     Pancreatic cancer Neg Hx     Esophageal cancer Neg Hx          Social History:  Social History        Substance and Sexual Activity  Alcohol Use Yes   Alcohol/week: 6.0 standard drinks of alcohol   Types: 2 Glasses of wine, 2 Cans of beer, 2 Shots of liquor per week  Social History        Substance and Sexual Activity  Drug Use Yes   Types: Marijuana    Social History         Socioeconomic History   Marital status: Divorced      Spouse name: Not on file   Number of children: Not on file   Years of education: Not on file   Highest education level: Not on file   Occupational History      Employer: [employer]  Tobacco Use   Smoking status: Former      Current packs/day: 0.00      Types: Cigarettes      Quit date: 2020      Years since quitting: 5.4   Smokeless tobacco: Never   Tobacco comments:      Social smoker quit 6-8 yrs ago per pt on 01-25-2022  Vaping Use   Vaping status: Never Used  Substance and Sexual Activity   Alcohol use: Yes      Alcohol/week: 6.0 standard drinks of alcohol      Types: 2 Glasses of wine, 2 Cans of beer, 2 Shots of liquor per week   Drug use: Yes      Types: Marijuana   Sexual activity: Yes      Birth control/protection: None  Other Topics Concern   Not on file  Social History Narrative    Engaged    occ EtOH, + Marijuana, no tobacco now - former    Right Handed    Drinks Caffeine      One Story Home     Social Drivers of Health        Financial Resource Strain: Not on file  Food Insecurity: No Food Insecurity (04/29/2024)    Hunger Vital Sign     Worried About Running Out of Food in the Last Year: Never true     Ran Out of Food in the Last Year: Never true  Transportation Needs: No Transportation Needs (04/29/2024)    PRAPARE - Therapist, art (Medical): No     Lack of Transportation (Non-Medical): No  Physical Activity: Not on file  Stress: Not on file (09/08/2023)  Social Connections: Socially Isolated (04/29/2024)    Social Connection and Isolation Panel     Frequency of Communication with Friends and Family: Twice a week     Frequency of Social Gatherings with Friends and Family: Once a week     Attends Religious Services: Never     Database administrator or Organizations: No     Attends Engineer, structural: Never     Marital Status: Never married    Past Medical History:      Past Medical History:  Diagnosis Date   Acute posttraumatic stress disorder     ADHD (attention deficit hyperactivity disorder), combined type     Allergy     Anal fistula     Arm  fracture      right and left   Asthma     Bipolar 2 disorder (HCC)     Borderline personality disorder (HCC) 05/23/2022   Cannabis use disorder     Cocaine use disorder, moderate, in early remission (HCC)     Complication of anesthesia      wakes up angry for about 5 minutes   Depression     Endometriosis     Foot pain      right foot hurts since last  night no reported injury per pt on 01-25-2022   GERD (gastroesophageal reflux disease)     Grief associated with loss of fetus     History of 2019 novel coronavirus disease (COVID-19) 04/29/2020    + Ag test - can see in Care everywhere   History of chicken pox      had 4 or 5 times as child   History of COVID-19 04/29/2020    mild all symptoms resolved   IBS (irritable bowel syndrome)     Irritable bowel     Leg fracture, left     Psychogenic nonepileptic seizure      last seizure 1 month ago per pt on 01-25-2022 saw dr lonne shivers neurology   Seizure [facility]      last seizure 12-28-2021 per pt on 01-25-2022   Self-injurious behavior     Shingles      intermittent outbreaks   Ulcerative colitis (HCC)     Wears glasses               Past Surgical History:  Procedure Laterality Date   APPENDECTOMY       BIOPSY   06/06/2020    Procedure: BIOPSY;  Surgeon: Avram Lupita BRAVO, MD;  Location: [facility];  Service: Endoscopy;;   COLONOSCOPY WITH PROPOFOL  N/A 06/06/2020    Procedure: COLONOSCOPY WITH PROPOFOL ;  Surgeon: Avram Lupita BRAVO, MD;  Location: [facility];  Service: Endoscopy;  Laterality: N/A;   DIAGNOSTIC LAPAROSCOPY WITH REMOVAL OF ECTOPIC PREGNANCY Right 12/11/2021    Procedure: LAPAROSCOPIC RIGHT SALPINGECTOMY WITH REMOVAL OF ECTOPIC PREGNANCY, lysis of adhesions;  Surgeon: Kandyce Sor, MD;  Location: [facility] OR;  Service: Gynecology;  Laterality: Right;   ESOPHAGOGASTRODUODENOSCOPY (EGD) WITH PROPOFOL  N/A 06/06/2020    Procedure: ESOPHAGOGASTRODUODENOSCOPY (EGD) WITH PROPOFOL ;  Surgeon: Avram Lupita BRAVO, MD;  Location: [facility]  ENDOSCOPY;  Service: Endoscopy;  Laterality: N/A;   HAND SURGERY Left      x3   LIGATION OF INTERNAL FISTULA TRACT N/A 03/29/2023    Procedure: LIGATION OF INTERNAL FISTULA TRACT;  Surgeon: Debby Hila, MD;  Location: [facility]S;  Service: General;  Laterality: N/A;   PLACEMENT OF SETON N/A 01/30/2022    Procedure: PLACEMENT OF SETON;  Surgeon: Debby Hila, MD;  Location: [facility]S;  Service: General;  Laterality: N/A;   RECTAL EXAM UNDER ANESTHESIA N/A 01/30/2022    Procedure: ANAL EXAM UNDER ANESTHESIA;  Surgeon: Debby Hila, MD;  Location: [facility] OR[facility];  Service: General;  Laterality: N/A;          Current Medications:          Current Facility-Administered Medications  Medication Dose Route Frequency Provider Last Rate Last Admin   acetaminophen  (TYLENOL ) tablet 650 mg  650 mg Oral Q6H PRN Penn, Cicely, NP   650 mg at 05/01/24 1001   alum & mag hydroxide-simeth (MAALOX/MYLANTA) 200-200-20 MG/5ML suspension 30 mL  30 mL Oral Q4H PRN Penn, Reymundo, NP       cariprazine  (VRAYLAR ) capsule 1.5 mg  1.5 mg Oral Daily Cleotilde Hoy HERO, NP   1.5 mg at 05/01/24 0844   chlorproMAZINE (THORAZINE) injection 50 mg  50 mg Intramuscular QID PRN Jadapalle, Sree, MD       chlorproMAZINE (THORAZINE) tablet 100 mg  100 mg Oral QID PRN Jadapalle, Sree, MD   100 mg at 05/01/24 1453   cholestyramine  light (PREVALITE ) packet 4 g  4 g Oral BID Cleotilde Hoy HERO, NP   4 g at 05/01/24 [phone]  clonazePAM  (KLONOPIN ) tablet 1 mg  1 mg Oral BID PRN Cleotilde Hoy HERO, NP   1 mg at 05/01/24 9147   cyclobenzaprine  (FLEXERIL ) tablet 10 mg  10 mg Oral Daily PRN Cleotilde Hoy HERO, NP   10 mg at 05/01/24 9147   desvenlafaxine  (PRISTIQ ) 24 hr tablet 50 mg  50 mg Oral Daily Lenon Elsie HERO, RPH   50 mg at 04/30/24 8164   haloperidol  (HALDOL ) tablet 5 mg  5 mg Oral TID PRN Sammy Molder, NP   5 mg at 05/01/24 1110    And   diphenhydrAMINE  (BENADRYL ) capsule 50 mg  50 mg Oral TID PRN Sammy Molder, NP   50 mg at 04/30/24  1626   haloperidol  lactate (HALDOL ) injection 10 mg  10 mg Intramuscular TID PRN Sammy Molder, NP        And   diphenhydrAMINE  (BENADRYL ) injection 50 mg  50 mg Intramuscular TID PRN Sammy Molder, NP        And   LORazepam  (ATIVAN ) injection 2 mg  2 mg Intramuscular TID PRN Sammy Molder, NP       haloperidol  lactate (HALDOL ) injection 5 mg  5 mg Intramuscular TID PRN Sammy Molder, NP        And   diphenhydrAMINE  (BENADRYL ) injection 50 mg  50 mg Intramuscular TID PRN Sammy Molder, NP   50 mg at 05/01/24 1111    And   LORazepam  (ATIVAN ) injection 2 mg  2 mg Intramuscular TID PRN Sammy Molder, NP   2 mg at 04/30/24 1659   lamoTRIgine  (LAMICTAL ) tablet 200 mg  200 mg Oral QHS Cleotilde Hoy HERO, NP   200 mg at 04/30/24 0954   lidocaine  (LIDODERM ) 5 % 1 patch  1 patch Transdermal Daily PRN Cleotilde Hoy HERO, NP   1 patch at 04/30/24 1319   magnesium  hydroxide (MILK OF MAGNESIA) suspension 15 mL  15 mL Oral Daily PRN Sammy Molder, NP       melatonin tablet 5 mg  5 mg Oral QHS Cleotilde Hoy HERO, NP   5 mg at 04/30/24 2052   ondansetron  (ZOFRAN -ODT) disintegrating tablet 4 mg  4 mg Oral Q8H PRN Cleotilde Hoy HERO, NP       pantoprazole  (PROTONIX ) EC tablet 40 mg  40 mg Oral Daily Penn, Molder, NP   40 mg at 05/01/24 0844   propranolol  (INDERAL ) tablet 10 mg  10 mg Oral TID PRN Cleotilde Hoy HERO, NP   10 mg at 04/30/24 1431   traZODone  (DESYREL ) tablet 100 mg  100 mg Oral QHS Penn, Molder, NP   100 mg at 04/30/24 2052   Vitamin D  (Ergocalciferol ) (DRISDOL ) 1.25 MG (50000 UNIT) capsule 50,000 Units  50,000 Units Oral Weekly Cleotilde Hoy HERO, NP   50,000 Units at 05/01/24 0845          Lab Results:  Lab Results Last 48 Hours        Results for orders placed or performed during the hospital encounter of 04/29/24 (from the past 48 hours)  Hemoglobin A1c     Status: None    Collection Time: 05/01/24  8:00 AM  Result Value Ref Range    Hgb A1c MFr Bld 5.0 4.8 - 5.6 %      Comment:  (NOTE) Diagnosis of Diabetes The following HbA1c ranges recommended by the American Diabetes Association (ADA) may be used as an aid in the diagnosis of diabetes mellitus.   Hemoglobin  Suggested A1C NGSP%              Diagnosis   <5.7                   Non Diabetic   5.7-6.4                Pre-Diabetic   >6.4                   Diabetic   <7.0                   Glycemic control for                       adults with diabetes.        Mean Plasma Glucose 96.8 mg/dL      Comment: Performed at [facility], [address] [place], [place] [address]  Lipid panel     Status: Abnormal    Collection Time: 05/01/24  8:00 AM  Result Value Ref Range    Cholesterol 229 (H) 0 - 200 mg/dL    Triglycerides 828 (H) <150 mg/dL    HDL 38 (L) >59 mg/dL    Total CHOL/HDL Ratio 6.0 RATIO    VLDL 34 0 - 40 mg/dL    LDL Cholesterol 842 (H) 0 - 99 mg/dL      Comment:        Total Cholesterol/HDL:CHD Risk Coronary Heart Disease Risk Table                     Men   Women  1/2 Average Risk   3.4   3.3  Average Risk       5.0   4.4  2 X Average Risk   9.6   7.1  3 X Average Risk  23.4   11.0        Use the calculated Patient Ratio above and the CHD Risk Table to determine the patient's CHD Risk.        ATP III CLASSIFICATION (LDL):  <100     mg/dL   Optimal  899-870  mg/dL   Near or Above                    Optimal  130-159  mg/dL   Borderline  839-810  mg/dL   High  >809     mg/dL   Very High Performed at [facility], [address] [place], [place] [address]          Blood Alcohol level:  Recent Labs       Lab Results  Component Value Date    [facility] <15 04/28/2024    ETH <10 11/10/2023        Metabolic Disorder Labs: Recent Labs       Lab Results  Component Value Date    HGBA1C 5.0 05/01/2024    MPG 96.8 05/01/2024    MPG 108 07/16/2023      Recent Labs       Lab Results  Component Value Date    PROLACTIN 16.1 09/20/2023      Recent  Labs       Lab Results  Component Value Date    CHOL 229 (H) 05/01/2024    TRIG 171 (H) 05/01/2024    HDL 38 (L) 05/01/2024    CHOLHDL 6.0 05/01/2024    VLDL 34 05/01/2024  LDLCALC 157 (H) 05/01/2024    LDLCALC 151 (H) 11/10/2023              Psychiatric Specialty Exam:   Presentation  General Appearance:  Fairly Groomed; Casual   Eye Contact: Other (comment) (good at times to avoidant and refusing when upset)   Speech: Normal Rate; Other (comment) (becoming loud and verbally aggressive at times of escalation)   Speech Volume: Increased       Mood and Affect  Mood: Irritable; Dysphoric; Labile; Euthymic   Affect: Full Range; Tearful; Inappropriate     Thought Process  Thought Processes: Linear   Descriptions of Associations:Tangential   Orientation:Full (Time, Place and Person)   Thought Content:Rumination; Obsessions   Hallucinations:Hallucinations: None   Ideas of Reference:Percusatory   Suicidal Thoughts:Suicidal Thoughts: Yes, Active SI Active Intent and/or Plan: Without Intent   Homicidal Thoughts:Homicidal Thoughts: No     Sensorium  Memory: Immediate Good; Remote Good   Judgment: poor     Insight: poor       Executive Functions  Concentration: Good   Attention Span: Good   Recall: Good   Fund of Knowledge: Good   Language: Good     Psychomotor Activity  Psychomotor Activity: Psychomotor Activity: Normal   Musculoskeletal: Strength & Muscle Tone: within normal limits Gait & Station: normal Assets  Assets: Housing; Advertising copywriter; Social Support; Manufacturing systems engineer; Desire for Improvement; Financial Resources/Insurance; Intimacy       Physical Exam: Physical Exam ROS Blood pressure 110/65, pulse 78, temperature (!) 97.4 F (36.3 C), resp. rate 14, height 5' 7 (1.702 m), weight 122.5 kg, SpO2 99%. Body mass index is 42.3 kg/m.   Diagnosis: Principal Problem:   Bipolar disorder (HCC) Active  Problems:   Borderline personality disorder (HCC)   Acute posttraumatic stress disorder   Cannabis abuse     PLAN: Safety and Monitoring:             -- Voluntary admission to inpatient psychiatric unit for safety, stabilization and treatment             -- Daily contact with patient to assess and evaluate symptoms and progress in treatment             -- Patient's case to be discussed in multi-disciplinary team meeting             -- Observation Level : q15 minute checks             -- Vital signs:  q12 hours             -- Precautions: suicide, elopement, and assault -- Encouraged patient to participate in unit milieu and in scheduled group therapies    2. Psychiatric Diagnoses and Treatment:    Bipolar disorder (HCC) BPD   Assessment: Patient presentation is meeting diagnostic criteria for Borderline Personality Disorder and Major Depressive Disorder, Recurrent, Severe, based on longstanding mood instability, affective dysregulation, self-harm behaviors, intrusive thoughts, and history of early trauma. Patient reports a pattern of unstable and intense interpersonal relationships, identity disturbance, impulsivity, recurrent suicidal behavior and gestures, affective instability, and chronic feelings of emptiness, further supporting the diagnosis of Borderline Personality Disorder. Presentation is also consistent with historical diagnosis of Bipolar II Disorder, primarily depressive episodes with mixed symptoms. Concerns raised regarding potential misdiagnosis of Bipolar I Disorder given the absence of clear manic symptoms. Further clinical assessment is needed for diagnostic clarity. There is notable impairment in daily functioning, high emotional reactivity, and chronic suicidal ideation. Risk  factors include intrusive suicidal thoughts, history of overdose attempt, trauma history, emotional lability, and limited outpatient stability. Protective factors include employment, education,  presence of a significant other, and engagement in care.  Plan: It is the judgment of the undersigned that patient is appropriate for treatment in this acute psychiatric setting related to concern for safety in the context of suicidal thoughts, emotional dysregulation, and self-injurious behaviors.Given continued significant mood lability and ruminative thought content, patient was started on Vraylar  1.5 mg by mouth daily to target mood instability and intrusive thoughts. Presentation currently aligns with historical diagnosis of Bipolar II Disorder, primarily depressive episodes with mixed features. Further clinical assessment is required for diagnostic clarity. At this time, the addition of an atypical antipsychotic is appropriate to support stabilization.Patient denies hallucinations, paranoia, and delusions. She does not appear psychotic. She appears calm and relaxed and engages brightly, which is incongruent with her reported internal distress.Patient acknowledges history of Borderline Personality Disorder and previously participated in intensive outpatient DBT therapy.Medication education is provided to include risks, benefits, and side effects. Patient verbalized understanding and agrees to plan of care.   Medications:  Pristiq  [dose not specified] Klonopin  1 mg by mouth twice daily as needed for anxiety Lamictal  200 mg by mouth daily Propranolol  10 mg by mouth three times daily as needed for anxiety Trazodone  100 mg by mouth at bedtime Melatonin 5 mg by mouth at bedtime (initiated in hospital) Vraylar  1.5 mg by mouth daily (initiated in hospital)                                         3. Medical Issues Being Addressed: No acute needs identified       4. Discharge Planning:              -- Social work and case management to assist with discharge planning and identification of hospital follow-up needs prior to discharge             -- Estimated LOS: 3-4 days   Hoy CHRISTELLA Pinal,  NP 05/01/2024, 5:20 PM

## 2024-05-02 NOTE — Group Note (Signed)
 Date:  05/02/2024 Time:  11:13 AM  Group Topic/Focus:  Coping With Mental Health Crisis:   The purpose of this group is to help patients identify strategies for coping with mental health crisis.  Group discusses possible causes of crisis and ways to manage them effectively. Goals Group:   The focus of this group is to help patients establish daily goals to achieve during treatment and discuss how the patient can incorporate goal setting into their daily lives to aide in recovery. Overcoming Stress:   The focus of this group is to define stress and help patients assess their triggers.    Participation Level:  Did Not Attend  Participation Quality:    Affect:    Cognitive:    Insight:   Engagement in Group:    Modes of Intervention:    Additional Comments:    Philena Obey L Bee Marchiano 05/02/2024, 11:13 AM

## 2024-05-02 NOTE — Group Note (Signed)
 Date:  05/02/2024 Time:  8:46 PM  Group Topic/Focus:  Wrap-Up Group:   The focus of this group is to help patients review their daily goal of treatment and discuss progress on daily workbooks.    Participation Level:  Active  Participation Quality:  Attentive  Affect:  Appropriate  Cognitive:  Alert  Insight: Appropriate  Engagement in Group:  Engaged  Modes of Intervention:  Discussion  Additional Comments:     Maglione,Charleton Deyoung E 05/02/2024, 8:46 PM

## 2024-05-02 NOTE — Progress Notes (Addendum)
 Patient reporting increased anxiety and agitation during shift. Patient declined propranolol  when offered and reported it was not effective for her. Provider notified. Patient appears anxious. Oral PRN medications given as ordered- reference MAR. Patient reported some relief in symptoms. Patient also complained of back pain- reference MAR for interventions. Patient reported decreased pain with interventions. Patient c/o anxiety and passive suicidal thoughts during this shift, contracts for safety. Patient reported in the past she has tried many medications with minimal improvement in her symptoms.

## 2024-05-02 NOTE — Progress Notes (Addendum)
 05/01/2024 5:20 PM Barbara Orozco  MRN:  [id number]     Subjective:  Chart reviewed, case discussed in multidisciplinary meeting, patient seen during rounds.    Patient is a 38 year old female with a past medical history of asthma, endometriosis, ulcerative colitis, and GERD, and a past psychiatric history of Bipolar I Disorder, Borderline Personality Disorder, Bipolar II Disorder, ADHD, and Cocaine Use Disorder. She presents voluntarily for psychiatric evaluation due to intrusive thoughts of hanging herself and reported self-injurious behaviors including banging her head on the wall and pulling her hair.  Per Chart Review Patient has a history of multiple inpatient psychiatric admissions, most recently in January 2025. She has received outpatient treatment with [facility] for psychotherapy. Prior psychiatric medication trials include Lamictal , Trazodone , Desyrel , Effexor  (Faxine), Buspirone , Klonopin , Topamax , Wellbutrin, Depakote, Spravato, and Lithium. She has been diagnosed with Bipolar I Disorder, Borderline Personality Disorder, Bipolar II Disorder, ADHD, and Cocaine Use Disorder. In the emergency department, she endorsed a mix of symptoms including depression, anxiety, panic, obsessive-compulsive tendencies, and emotional dysregulation. She appeared emotionally labile.   Patient seen today for follow-up psychiatric evaluation. She reports ongoing mood lability and states she is completely unable to control her behaviors: "When I get to that point." She expresses dissatisfaction with current treatment, stating "nothing helps, y'all are doing nothing in here." She rates her depression as 10/10 and reports "barely being able to take care of [herself]." despite noting she has showered and attended meals. She complains nursing is given conflicting information than what this provider knows to be ordered.   She reports that sleep and appetite are stable. She denies hallucinations, paranoia, or delusions,  and there is no indication of suicidal or homicidal ideation. She denies medication side effects and there are none noted.  Assessment: Patient continues to demonstrate significant mood instability, poor distress tolerance, and impulsivity in the context of Borderline Personality Disorder traits and underlying affective disorder. She remains at risk for self-harm and is not yet appropriate for discharge.    Family History:       Family History  Problem Relation Age of Onset   Colitis Mother     Ulcerative colitis Mother     Heart disease Mother     Endocrine tumor Mother          pituitary tumor   Colitis Father     Ulcerative colitis Father     Diabetes Father     Endocrine tumor Maternal Grandmother          piturtary tumor   Colitis Paternal Grandmother     Ulcerative colitis Paternal Grandmother     Stomach cancer Paternal Aunt     Colon cancer Neg Hx     Pancreatic cancer Neg Hx     Esophageal cancer Neg Hx          Social History:  Social History        Substance and Sexual Activity  Alcohol Use Yes   Alcohol/week: 6.0 standard drinks of alcohol   Types: 2 Glasses of wine, 2 Cans of beer, 2 Shots of liquor per week     Social History        Substance and Sexual Activity  Drug Use Yes   Types: Marijuana    Social History         Socioeconomic History   Marital status: Divorced      Spouse name: Not on file   Number of children: Not on file   Years of  education: Not on file   Highest education level: Not on file  Occupational History      Employer: [employer]  Tobacco Use   Smoking status: Former      Current packs/day: 0.00      Types: Cigarettes      Quit date: 2020      Years since quitting: 5.4   Smokeless tobacco: Never   Tobacco comments:      Social smoker quit 6-8 yrs ago per pt on 01-25-2022  Vaping Use   Vaping status: Never Used  Substance and Sexual Activity   Alcohol use: Yes      Alcohol/week: 6.0 standard drinks of alcohol       Types: 2 Glasses of wine, 2 Cans of beer, 2 Shots of liquor per week   Drug use: Yes      Types: Marijuana   Sexual activity: Yes      Birth control/protection: None  Other Topics Concern   Not on file  Social History Narrative    Engaged    occ EtOH, + Marijuana, no tobacco now - former    Right Handed    Drinks Caffeine      One Story Home     Social Drivers of Health        Financial Resource Strain: Not on file  Food Insecurity: No Food Insecurity (04/29/2024)    Hunger Vital Sign     Worried About Running Out of Food in the Last Year: Never true     Ran Out of Food in the Last Year: Never true  Transportation Needs: No Transportation Needs (04/29/2024)    PRAPARE - Therapist, art (Medical): No     Lack of Transportation (Non-Medical): No  Physical Activity: Not on file  Stress: Not on file (09/08/2023)  Social Connections: Socially Isolated (04/29/2024)    Social Connection and Isolation Panel     Frequency of Communication with Friends and Family: Twice a week     Frequency of Social Gatherings with Friends and Family: Once a week     Attends Religious Services: Never     Database administrator or Organizations: No     Attends Engineer, structural: Never     Marital Status: Never married    Past Medical History:      Past Medical History:  Diagnosis Date   Acute posttraumatic stress disorder     ADHD (attention deficit hyperactivity disorder), combined type     Allergy     Anal fistula     Arm fracture      right and left   Asthma     Bipolar 2 disorder (HCC)     Borderline personality disorder (HCC) 05/23/2022   Cannabis use disorder     Cocaine use disorder, moderate, in early remission (HCC)     Complication of anesthesia      wakes up angry for about 5 minutes   Depression     Endometriosis     Foot pain      right foot hurts since last night no reported injury per pt on 01-25-2022   GERD (gastroesophageal reflux  disease)     Grief associated with loss of fetus     History of 2019 novel coronavirus disease (COVID-19) 04/29/2020    + Ag test - can see in Care everywhere   History of chicken pox      had 4 or 5  times as child   History of COVID-19 04/29/2020    mild all symptoms resolved   IBS (irritable bowel syndrome)     Irritable bowel     Leg fracture, left     Psychogenic nonepileptic seizure      last seizure 1 month ago per pt on 01-25-2022 saw dr lonne shivers neurology   Seizure [facility])      last seizure 12-28-2021 per pt on 01-25-2022   Self-injurious behavior     Shingles      intermittent outbreaks   Ulcerative colitis [facility])     Wears glasses               Past Surgical History:  Procedure Laterality Date   APPENDECTOMY       BIOPSY   06/06/2020    Procedure: BIOPSY;  Surgeon: Avram Lupita BRAVO, MD;  Location: [facility];  Service: Endoscopy;;   COLONOSCOPY WITH PROPOFOL  N/A 06/06/2020    Procedure: COLONOSCOPY WITH PROPOFOL ;  Surgeon: Avram Lupita BRAVO, MD;  Location: [facility];  Service: Endoscopy;  Laterality: N/A;   DIAGNOSTIC LAPAROSCOPY WITH REMOVAL OF ECTOPIC PREGNANCY Right 12/11/2021    Procedure: LAPAROSCOPIC RIGHT SALPINGECTOMY WITH REMOVAL OF ECTOPIC PREGNANCY, lysis of adhesions;  Surgeon: Kandyce Sor, MD;  Location: [facility] OR;  Service: Gynecology;  Laterality: Right;   ESOPHAGOGASTRODUODENOSCOPY (EGD) WITH PROPOFOL  N/A 06/06/2020    Procedure: ESOPHAGOGASTRODUODENOSCOPY (EGD) WITH PROPOFOL ;  Surgeon: Avram Lupita BRAVO, MD;  Location: [facility];  Service: Endoscopy;  Laterality: N/A;   HAND SURGERY Left      x3   LIGATION OF INTERNAL FISTULA TRACT N/A 03/29/2023    Procedure: LIGATION OF INTERNAL FISTULA TRACT;  Surgeon: Debby Hila, MD;  Location: [facility];  Service: General;  Laterality: N/A;   PLACEMENT OF SETON N/A 01/30/2022    Procedure: PLACEMENT OF SETON;  Surgeon: Debby Hila, MD;  Location: [facility];  Service: General;  Laterality: N/A;   RECTAL EXAM  UNDER ANESTHESIA N/A 01/30/2022    Procedure: ANAL EXAM UNDER ANESTHESIA;  Surgeon: Debby Hila, MD;  Location: [facility];  Service: General;  Laterality: N/A;          Current Medications:          Current Facility-Administered Medications  Medication Dose Route Frequency Provider Last Rate Last Admin   acetaminophen  (TYLENOL ) tablet 650 mg  650 mg Oral Q6H PRN Penn, Reymundo, NP   650 mg at 05/01/24 1001   alum & mag hydroxide-simeth (MAALOX/MYLANTA) 200-200-20 MG/5ML suspension 30 mL  30 mL Oral Q4H PRN Penn, Reymundo, NP       cariprazine  (VRAYLAR ) capsule 1.5 mg  1.5 mg Oral Daily Cleotilde Hoy HERO, NP   1.5 mg at 05/01/24 0844   chlorproMAZINE (THORAZINE) injection 50 mg  50 mg Intramuscular QID PRN Jadapalle, Sree, MD       chlorproMAZINE (THORAZINE) tablet 100 mg  100 mg Oral QID PRN Jadapalle, Sree, MD   100 mg at 05/01/24 1453   cholestyramine  light (PREVALITE ) packet 4 g  4 g Oral BID Cleotilde Hoy HERO, NP   4 g at 05/01/24 0845   clonazePAM  (KLONOPIN ) tablet 1 mg  1 mg Oral BID PRN Cleotilde Hoy HERO, NP   1 mg at 05/01/24 9147   cyclobenzaprine  (FLEXERIL ) tablet 10 mg  10 mg Oral Daily PRN Cleotilde Hoy HERO, NP   10 mg at 05/01/24 9147   desvenlafaxine  (PRISTIQ ) 24 hr tablet 50 mg  50 mg Oral Daily Lenon Elsie HERO,  RPH   50 mg at 04/30/24 1835   haloperidol  (HALDOL ) tablet 5 mg  5 mg Oral TID PRN Sammy Molder, NP   5 mg at 05/01/24 1110    And   diphenhydrAMINE  (BENADRYL ) capsule 50 mg  50 mg Oral TID PRN Sammy Molder, NP   50 mg at 04/30/24 1626   haloperidol  lactate (HALDOL ) injection 10 mg  10 mg Intramuscular TID PRN Sammy Molder, NP        And   diphenhydrAMINE  (BENADRYL ) injection 50 mg  50 mg Intramuscular TID PRN Sammy Molder, NP        And   LORazepam  (ATIVAN ) injection 2 mg  2 mg Intramuscular TID PRN Sammy Molder, NP       haloperidol  lactate (HALDOL ) injection 5 mg  5 mg Intramuscular TID PRN Sammy Molder, NP        And   diphenhydrAMINE  (BENADRYL ) injection  50 mg  50 mg Intramuscular TID PRN Sammy Molder, NP   50 mg at 05/01/24 1111    And   LORazepam  (ATIVAN ) injection 2 mg  2 mg Intramuscular TID PRN Sammy Molder, NP   2 mg at 04/30/24 1659   lamoTRIgine  (LAMICTAL ) tablet 200 mg  200 mg Oral QHS Cleotilde Hoy HERO, NP   200 mg at 04/30/24 9045   lidocaine  (LIDODERM ) 5 % 1 patch  1 patch Transdermal Daily PRN Cleotilde Hoy HERO, NP   1 patch at 04/30/24 1319   magnesium  hydroxide (MILK OF MAGNESIA) suspension 15 mL  15 mL Oral Daily PRN Sammy Molder, NP       melatonin tablet 5 mg  5 mg Oral QHS Cleotilde Hoy HERO, NP   5 mg at 04/30/24 2052   ondansetron  (ZOFRAN -ODT) disintegrating tablet 4 mg  4 mg Oral Q8H PRN Cleotilde Hoy HERO, NP       pantoprazole  (PROTONIX ) EC tablet 40 mg  40 mg Oral Daily Penn, Molder, NP   40 mg at 05/01/24 0844   propranolol  (INDERAL ) tablet 10 mg  10 mg Oral TID PRN Cleotilde Hoy HERO, NP   10 mg at 04/30/24 1431   traZODone  (DESYREL ) tablet 100 mg  100 mg Oral QHS Penn, Molder, NP   100 mg at 04/30/24 2052   Vitamin D  (Ergocalciferol ) (DRISDOL ) 1.25 MG (50000 UNIT) capsule 50,000 Units  50,000 Units Oral Weekly Cleotilde Hoy HERO, NP   50,000 Units at 05/01/24 0845          Lab Results:  Lab Results Last 48 Hours        Results for orders placed or performed during the hospital encounter of 04/29/24 (from the past 48 hours)  Hemoglobin A1c     Status: None    Collection Time: 05/01/24  8:00 AM  Result Value Ref Range    Hgb A1c MFr Bld 5.0 4.8 - 5.6 %      Comment: (NOTE) Diagnosis of Diabetes The following HbA1c ranges recommended by the American Diabetes Association (ADA) may be used as an aid in the diagnosis of diabetes mellitus.   Hemoglobin             Suggested A1C NGSP%              Diagnosis   <5.7                   Non Diabetic   5.7-6.4                Pre-Diabetic   >  6.4                   Diabetic   <7.0                   Glycemic control for                       adults with diabetes.         Mean Plasma Glucose 96.8 mg/dL      Comment: Performed at [facility], [address], [place], [place] [address]  Lipid panel     Status: Abnormal    Collection Time: 05/01/24  8:00 AM  Result Value Ref Range    Cholesterol 229 (H) 0 - 200 mg/dL    Triglycerides 828 (H) <150 mg/dL    HDL 38 (L) >59 mg/dL    Total CHOL/HDL Ratio 6.0 RATIO    VLDL 34 0 - 40 mg/dL    LDL Cholesterol 842 (H) 0 - 99 mg/dL      Comment:        Total Cholesterol/HDL:CHD Risk Coronary Heart Disease Risk Table                     Men   Women  1/2 Average Risk   3.4   3.3  Average Risk       5.0   4.4  2 X Average Risk   9.6   7.1  3 X Average Risk  23.4   11.0        Use the calculated Patient Ratio above and the CHD Risk Table to determine the patient's CHD Risk.        ATP III CLASSIFICATION (LDL):  <100     mg/dL   Optimal  899-870  mg/dL   Near or Above                    Optimal  130-159  mg/dL   Borderline  839-810  mg/dL   High  >809     mg/dL   Very High Performed at [facility], [address] [place], [place] [address]          Blood Alcohol level:  Recent Labs       Lab Results  Component Value Date    [facility] <15 04/28/2024    ETH <10 11/10/2023        Metabolic Disorder Labs: Recent Labs       Lab Results  Component Value Date    HGBA1C 5.0 05/01/2024    MPG 96.8 05/01/2024    MPG 108 07/16/2023      Recent Labs       Lab Results  Component Value Date    PROLACTIN 16.1 09/20/2023      Recent Labs       Lab Results  Component Value Date    CHOL 229 (H) 05/01/2024    TRIG 171 (H) 05/01/2024    HDL 38 (L) 05/01/2024    CHOLHDL 6.0 05/01/2024    VLDL 34 05/01/2024    LDLCALC 157 (H) 05/01/2024    LDLCALC 151 (H) 11/10/2023              Psychiatric Specialty Exam:   Appearance (including eye contact): well-groomed, adequate hygiene, maintains eye contact  Behavior: demanding, emotionally reactive  Speech: pressured at times,  otherwise normal  Mood: labile  Affect: incongruent with stated mood  Thought Process: linear  Thought Content (SI/HI/psychosis): denies all  Insight: limited  Judgment: impaired  Attention: distractible  Orientation: alert and oriented x3  Psychomotor Activity: within normal limits  Sleep: stable  Appetite: stable   Musculoskeletal: Strength & Muscle Tone: within normal limits Gait & Station: normal Assets  Assets: Housing; Advertising copywriter; Social Support; Manufacturing systems engineer; Desire for Improvement; Financial Resources/Insurance; Intimacy       Physical Exam: Physical Exam ROS Blood pressure 110/65, pulse 78, temperature (!) 97.4 F (36.3 C), resp. rate 14, height 5' 7 (1.702 m), weight 122.5 kg, SpO2 99%. Body mass index is 42.3 kg/m.        PLAN: Safety and Monitoring:             -- Voluntary admission to inpatient psychiatric unit for safety, stabilization and treatment             -- Daily contact with patient to assess and evaluate symptoms and progress in treatment             -- Patient's case to be discussed in multi-disciplinary team meeting             -- Observation Level : q15 minute checks             -- Vital signs:  q12 hours             -- Precautions: suicide, elopement, and assault -- Encouraged patient to participate in unit milieu and in scheduled group therapies    2. Psychiatric Diagnoses and Treatment:    Diagnosis: Principal Problem:  Bipolar disorder (HCC) Active Problems:   Borderline personality disorder (HCC)   Acute posttraumatic stress disorder   Cannabis abuse   Assessment: Patient presentation is meeting diagnostic criteria for Borderline Personality Disorder and Major Depressive Disorder, Recurrent, Severe, based on longstanding mood instability, affective dysregulation, self-harm behaviors, intrusive thoughts, and history of early trauma. Patient reports a pattern of unstable and intense interpersonal  relationships, identity disturbance, impulsivity, recurrent suicidal behavior and gestures, affective instability, and chronic feelings of emptiness, further supporting the diagnosis of Borderline Personality Disorder. Presentation is also consistent with historical diagnosis of Bipolar II Disorder, primarily depressive episodes with mixed symptoms. Concerns raised regarding potential misdiagnosis of Bipolar I Disorder given the absence of clear manic symptoms. Further clinical assessment is needed for diagnostic clarity. There is notable impairment in daily functioning, high emotional reactivity, and chronic suicidal ideation. Risk factors include intrusive suicidal thoughts, history of overdose attempt, trauma history, emotional lability, and limited outpatient stability. Protective factors include employment, education, presence of a significant other, and engagement in care.  Plan: It is the judgment of the undersigned that patient is appropriate for treatment in this acute psychiatric setting related to concern for safety in the context of suicidal thoughts, emotional dysregulation, and self-injurious behaviors.   Given continued significant mood lability and ruminative thought content, Vraylar  increased to 3 mg by mouth daily to target mood instability and intrusive thoughts. Presentation currently aligns with historical diagnosis of Bipolar II Disorder, primarily depressive episodes with mixed features. Further clinical assessment is required for diagnostic clarity. At this time, the addition of an atypical antipsychotic is appropriate to support stabilization. Psychotropic medications were reviewed with the patient. Vraylar  was increased from 1.5 mg to 3 mg daily due to persistent affective instability and inadequate response at the initial dose. Patient continues to request additional medication for sedation to manage emotional outbursts. This provider explained that no additional benzodiazepines or  sedative  medications will be initiated. Patient was encouraged to use available PRNs and engage in behavioral strategies. Education was provided regarding the role of coping skills in conjunction with pharmacologic treatment, and introductory education was offered on the principles of Dialectical Behavior Therapy (DBT). Patient verbalized understanding but continued to express frustration.  PRN medications were reviewed again, including Thorazine 50 mg IM up to 4 times daily and Thorazine 100 mg PO up to 4 times daily for severe mood dysregulation and risk of self-harm. Medication education is provided to include risks, benefits, and side effects. Patient verbalized understanding and agrees to the plan of care. Denies medication side effects and there are none noted. Patient remains appropriate for the inpatient psychiatric setting for safety, medication management, and stabilization.   Patient denies hallucinations, paranoia, and delusions. She does not appear psychotic. She appears calm and relaxed and engages brightly, which is incongruent with her reported internal distress.Patient acknowledges history of Borderline Personality Disorder and previously participated in intensive outpatient DBT therapy.Medication education is provided to include risks, benefits, and side effects. Patient verbalized understanding and agrees to plan of care.   Medications:  Pristiq  [dose not specified] Klonopin  1 mg PO BID PRN for anxiety Lamictal  200 mg PO daily Propranolol  10 mg PO TID PRN for anxiety Trazodone  100 mg PO at bedtime Melatonin 5 mg PO at bedtime (initiated in hospital) Vraylar  3 mg PO daily (? today from 1.5 mg) Thorazine 100 mg PO QID PRN Thorazine 50 mg IM QID PRN                                         3. Medical Issues Being Addressed: No acute needs identified       4. Discharge Planning:              -- Social work and case management to assist with discharge planning and identification  of hospital follow-up needs prior to discharge             -- Estimated LOS: 3-4 days   Hoy CHRISTELLA Pinal, NP 05/01/2024, 5:20 PM

## 2024-05-02 NOTE — Progress Notes (Signed)
 Patient was up and visible on the unit at the start of the shift. Withdrawn to self, spent some brief time in the day area, amongst peers. Received PRN for c/o anxiety (see MAR). Compliant with all aspects of care this shift, including scheduled medications. Ate evening snack. Slept throughout the night w/o any complaints. No distress noted/ reported. Rise and fall of chest noted. All orders maintained as written.

## 2024-05-03 MED ORDER — ACETAMINOPHEN-CAFFEINE 500-65 MG PO TABS
1.0000 | ORAL_TABLET | Freq: Four times a day (QID) | ORAL | Status: DC | PRN
  Administered 2024-05-04: 1 via ORAL
  Filled 2024-05-03 (×2): qty 1

## 2024-05-03 MED ORDER — CALCIUM CARBONATE ANTACID 500 MG PO CHEW
1.0000 | CHEWABLE_TABLET | Freq: Three times a day (TID) | ORAL | Status: DC | PRN
  Administered 2024-05-03: 200 mg via ORAL
  Filled 2024-05-03 (×2): qty 1

## 2024-05-03 NOTE — Group Note (Signed)
 LCSW Group Therapy Note   Group Date: 05/03/2024 Start Time: 1300 End Time: 1400   Type of Therapy and Topic:  Group Therapy: Boundaries  Participation Level:  Did Not Attend  Description of Group: This group will address the use of boundaries in their personal lives. Patients will explore why boundaries are important, the difference between healthy and unhealthy boundaries, and negative and postive outcomes of different boundaries and will look at how boundaries can be crossed.  Patients will be encouraged to identify current boundaries in their own lives and identify what kind of boundary is being set. Facilitators will guide patients in utilizing problem-solving interventions to address and correct types boundaries being used and to address when no boundary is being used. Understanding and applying boundaries will be explored and addressed for obtaining and maintaining a balanced life. Patients will be encouraged to explore ways to assertively make their boundaries and needs known to significant others in their lives, using other group members and facilitator for role play, support, and feedback.  Therapeutic Goals:  1.  Patient will identify areas in their life where setting clear boundaries could be  used to improve their life.  2.  Patient will identify signs/triggers that a boundary is not being respected. 3.  Patient will identify two ways to set boundaries in order to achieve balance in  their lives: 4.  Patient will demonstrate ability to communicate their needs and set boundaries  through discussion and/or role plays  Summary of Patient Progress:  Patient did not attend.   Therapeutic Modalities:   Cognitive Behavioral Therapy Solution-Focused Therapy  [name], [place] 05/03/2024  2:21 PM

## 2024-05-03 NOTE — Plan of Care (Signed)
   Problem: Education: Goal: Emotional status will improve Outcome: Progressing   Problem: Education: Goal: Mental status will improve Outcome: Progressing

## 2024-05-03 NOTE — Progress Notes (Signed)
   05/03/24 0900  Psych Admission Type (Psych Patients Only)  Admission Status Involuntary  Psychosocial Assessment  Patient Complaints Anxiety  Eye Contact Fair  Facial Expression Blank  Affect Anxious  Speech Logical/coherent  Interaction Minimal  Motor Activity Pacing  Appearance/Hygiene Meticulous  Behavior Characteristics Cooperative  Mood Anxious  Thought Process  Coherency WDL  Content WDL  Delusions None reported or observed  Perception WDL  Hallucination None reported or observed  Judgment WDL  Confusion None  Danger to Self  Current suicidal ideation? Denies  Agreement Not to Harm Self Yes  Description of Agreement verbal  Danger to Others  Danger to Others None reported or observed     05/03/24 0900  Psych Admission Type (Psych Patients Only)  Admission Status Involuntary  Psychosocial Assessment  Patient Complaints Anxiety  Eye Contact Fair  Facial Expression Blank  Affect Anxious  Speech Logical/coherent  Interaction Minimal  Motor Activity Pacing  Appearance/Hygiene Meticulous  Behavior Characteristics Cooperative  Mood Anxious  Thought Process  Coherency WDL  Content WDL  Delusions None reported or observed  Perception WDL  Hallucination None reported or observed  Judgment WDL  Confusion None  Danger to Self  Current suicidal ideation? Denies  Agreement Not to Harm Self Yes  Description of Agreement verbal  Danger to Others  Danger to Others None reported or observed

## 2024-05-03 NOTE — Plan of Care (Signed)
  Problem: Education: Goal: Knowledge of [facility] Education information/materials will improve Outcome: Completed/Met Goal: Emotional status will improve Outcome: Progressing   Problem: Coping: Goal: Ability to verbalize frustrations and anger appropriately will improve Outcome: Progressing   Problem: Health Behavior/Discharge Planning: Goal: Identification of resources available to assist in meeting health care needs will improve Outcome: Progressing Goal: Compliance with treatment plan for underlying cause of condition will improve Outcome: Progressing

## 2024-05-03 NOTE — Progress Notes (Signed)
   05/02/24 2000  Psych Admission Type (Psych Patients Only)  Admission Status Involuntary  Psychosocial Assessment  Patient Complaints Depression  Eye Contact Fair  Facial Expression Blank  Affect Anxious  Speech Logical/coherent  Interaction Minimal  Motor Activity Pacing  Appearance/Hygiene Improved  Behavior Characteristics Cooperative;Appropriate to situation  Mood Anxious  Thought Process  Coherency WDL  Content WDL  Delusions WDL  Perception WDL  Hallucination None reported or observed  Judgment WDL  Confusion WDL  Danger to Self  Current suicidal ideation? Denies  Agreement Not to Harm Self Yes  Description of Agreement verbal  Danger to Others  Danger to Others None reported or observed   Alert and oriented x 4, affect is flat but brightens upon approach denies SI/HI/AVH.

## 2024-05-03 NOTE — Group Note (Signed)
 Date:  05/03/2024 Time:  9:09 PM  Group Topic/Focus:  Self Care:   The focus of this group is to help patients understand the importance of self-care in order to improve or restore emotional, physical, spiritual, interpersonal, and financial health. Stages of Change:   The focus of this group is to explain the stages of change and help patients identify changes they want to make upon discharge. Wrap-Up Group:   The focus of this group is to help patients review their daily goal of treatment and discuss progress on daily workbooks.    Participation Level:  Active  Participation Quality:  Appropriate and Attentive  Affect:  Appropriate  Cognitive:  Alert and Appropriate  Insight: Appropriate and Good  Engagement in Group:  Engaged  Modes of Intervention:  Discussion  Additional Comments:     Maglione,Pate Aylward E 05/03/2024, 9:09 PM

## 2024-05-03 NOTE — Group Note (Signed)
 Date:  05/03/2024 Time:  11:22 AM  Group Topic/Focus:  Wellness Toolbox:   The focus of this group is to discuss various aspects of wellness, balancing those aspects and exploring ways to increase the ability to experience wellness.  Patients will create a wellness toolbox for use upon discharge.    Participation Level:  Active  Participation Quality:  Appropriate  Affect:  Appropriate  Cognitive:  Appropriate  Insight: Appropriate  Engagement in Group:  Supportive  Modes of Intervention:  Socialization  Additional Comments:    Deitra Clap Jareb Radoncic 05/03/2024, 11:22 AM

## 2024-05-03 NOTE — Progress Notes (Signed)
 05/01/2024 5:20 PM Barbara Orozco  MRN:  [id number]     Subjective:  Chart reviewed, case discussed in multidisciplinary meeting, patient seen during rounds.    Patient is a 38 year old female with a past medical history of asthma, endometriosis, ulcerative colitis, and GERD, and a past psychiatric history of Bipolar I Disorder, Borderline Personality Disorder, Bipolar II Disorder, ADHD, and Cocaine Use Disorder. She presents voluntarily for psychiatric evaluation due to intrusive thoughts of hanging herself and reported self-injurious behaviors including banging her head on the wall and pulling her hair.  Per Chart Review Patient has a history of multiple inpatient psychiatric admissions, most recently in January 2025. She has received outpatient treatment with [facility] for psychotherapy. Prior psychiatric medication trials include Lamictal , Trazodone , Desyrel , Effexor  (Faxine), Buspirone , Klonopin , Topamax , Wellbutrin, Depakote, Spravato, and Lithium. She has been diagnosed with Bipolar I Disorder, Borderline Personality Disorder, Bipolar II Disorder, ADHD, and Cocaine Use Disorder. In the emergency department, she endorsed a mix of symptoms including depression, anxiety, panic, obsessive-compulsive tendencies, and emotional dysregulation. She appeared emotionally labile.  Patient seen today for follow-up psychiatric evaluation. Upon approach, she is noted to be walking in the hall and is agreeable to interview; however, she avoids eye contact with this provider. When asked how she is doing, she gives limited verbal response. She reports continued anxious mood and ongoing suicidal ideation, stating the thoughts are present "every day," but remain passive in nature. She describes heightened anxiety in this setting due to sensory sensitivities, reporting that "the noise is loud here" and difficult to tolerate. She verbalizes a plan for discharge to [facility], which  is located near her home. She reports that sleep and appetite are stable. She denies hallucinations, paranoia, or delusions, and there is no indication of suicidal or homicidal ideation. She denies medication side effects and there are none noted. She requests TUMS for indigestion otherwise is not focused today on need for obtaining higher doses of prn medication.   Today's presentation is depressed and irritable. She demonstrates negativism related to treatment, but there are no emotional outbursts, crying spells, or behavioral dysregulation. She continues to struggle with the stimulation of the inpatient environment, noting the noise level exacerbates her anxiety. Despite symptoms, she is medication compliant and remains oriented to discharge planning, expressing intent to follow up at [facility]      Family History:       Family History  Problem Relation Age of Onset   Colitis Mother     Ulcerative colitis Mother     Heart disease Mother     Endocrine tumor Mother          pituitary tumor   Colitis Father     Ulcerative colitis Father     Diabetes Father     Endocrine tumor Maternal Grandmother          piturtary tumor   Colitis Paternal Grandmother     Ulcerative colitis Paternal Grandmother     Stomach cancer Paternal Aunt     Colon cancer Neg Hx     Pancreatic cancer Neg Hx     Esophageal cancer Neg Hx          Social History:  Social History        Substance and Sexual Activity  Alcohol Use Yes   Alcohol/week: 6.0 standard drinks of alcohol   Types: 2 Glasses of wine, 2 Cans of beer, 2 Shots of liquor per week  Social History        Substance and Sexual Activity  Drug Use Yes   Types: Marijuana    Social History         Socioeconomic History   Marital status: Divorced      Spouse name: Not on file   Number of children: Not on file   Years of education: Not on file   Highest education level: Not on file  Occupational History      Employer:  [employer]  Tobacco Use   Smoking status: Former      Current packs/day: 0.00      Types: Cigarettes      Quit date: 2020      Years since quitting: 5.4   Smokeless tobacco: Never   Tobacco comments:      Social smoker quit 6-8 yrs ago per pt on 01-25-2022  Vaping Use   Vaping status: Never Used  Substance and Sexual Activity   Alcohol use: Yes      Alcohol/week: 6.0 standard drinks of alcohol      Types: 2 Glasses of wine, 2 Cans of beer, 2 Shots of liquor per week   Drug use: Yes      Types: Marijuana   Sexual activity: Yes      Birth control/protection: None  Other Topics Concern   Not on file  Social History Narrative    Engaged    occ EtOH, + Marijuana, no tobacco now - former    Right Handed    Drinks Caffeine      One Story Home     Social Drivers of Health        Financial Resource Strain: Not on file  Food Insecurity: No Food Insecurity (04/29/2024)    Hunger Vital Sign     Worried About Running Out of Food in the Last Year: Never true     Ran Out of Food in the Last Year: Never true  Transportation Needs: No Transportation Needs (04/29/2024)    PRAPARE - Therapist, art (Medical): No     Lack of Transportation (Non-Medical): No  Physical Activity: Not on file  Stress: Not on file (09/08/2023)  Social Connections: Socially Isolated (04/29/2024)    Social Connection and Isolation Panel     Frequency of Communication with Friends and Family: Twice a week     Frequency of Social Gatherings with Friends and Family: Once a week     Attends Religious Services: Never     Database administrator or Organizations: No     Attends Engineer, structural: Never     Marital Status: Never married    Past Medical History:      Past Medical History:  Diagnosis Date   Acute posttraumatic stress disorder     ADHD (attention deficit hyperactivity disorder), combined type     Allergy     Anal fistula     Arm fracture      right and left    Asthma     Bipolar 2 disorder (HCC)     Borderline personality disorder (HCC) 05/23/2022   Cannabis use disorder     Cocaine use disorder, moderate, in early remission (HCC)     Complication of anesthesia      wakes up angry for about 5 minutes   Depression     Endometriosis     Foot pain      right foot hurts since last  night no reported injury per pt on 01-25-2022   GERD (gastroesophageal reflux disease)     Grief associated with loss of fetus     History of 2019 novel coronavirus disease (COVID-19) 04/29/2020    + Ag test - can see in Care everywhere   History of chicken pox      had 4 or 5 times as child   History of COVID-19 04/29/2020    mild all symptoms resolved   IBS (irritable bowel syndrome)     Irritable bowel     Leg fracture, left     Psychogenic nonepileptic seizure      last seizure 1 month ago per pt on 01-25-2022 saw dr lonne shivers neurology   Seizure [facility])      last seizure 12-28-2021 per pt on 01-25-2022   Self-injurious behavior     Shingles      intermittent outbreaks   Ulcerative colitis (HCC)     Wears glasses               Past Surgical History:  Procedure Laterality Date   APPENDECTOMY       BIOPSY   06/06/2020    Procedure: BIOPSY;  Surgeon: Avram Lupita BRAVO, MD;  Location: [facility];  Service: Endoscopy;;   COLONOSCOPY WITH PROPOFOL  N/A 06/06/2020    Procedure: COLONOSCOPY WITH PROPOFOL ;  Surgeon: Avram Lupita BRAVO, MD;  Location: [facility];  Service: Endoscopy;  Laterality: N/A;   DIAGNOSTIC LAPAROSCOPY WITH REMOVAL OF ECTOPIC PREGNANCY Right 12/11/2021    Procedure: LAPAROSCOPIC RIGHT SALPINGECTOMY WITH REMOVAL OF ECTOPIC PREGNANCY, lysis of adhesions;  Surgeon: Kandyce Sor, MD;  Location: [facility];  Service: Gynecology;  Laterality: Right;   ESOPHAGOGASTRODUODENOSCOPY (EGD) WITH PROPOFOL  N/A 06/06/2020    Procedure: ESOPHAGOGASTRODUODENOSCOPY (EGD) WITH PROPOFOL ;  Surgeon: Avram Lupita BRAVO, MD;  Location: [facility];  Service: Endoscopy;   Laterality: N/A;   HAND SURGERY Left      x3   LIGATION OF INTERNAL FISTULA TRACT N/A 03/29/2023    Procedure: LIGATION OF INTERNAL FISTULA TRACT;  Surgeon: Debby Hila, MD;  Location: [facility];  Service: General;  Laterality: N/A;   PLACEMENT OF SETON N/A 01/30/2022    Procedure: PLACEMENT OF SETON;  Surgeon: Debby Hila, MD;  Location: [facility];  Service: General;  Laterality: N/A;   RECTAL EXAM UNDER ANESTHESIA N/A 01/30/2022    Procedure: ANAL EXAM UNDER ANESTHESIA;  Surgeon: Debby Hila, MD;  Location: [facility];  Service: General;  Laterality: N/A;          Current Medications:          Current Facility-Administered Medications  Medication Dose Route Frequency Provider Last Rate Last Admin   acetaminophen  (TYLENOL ) tablet 650 mg  650 mg Oral Q6H PRN Penn, Cicely, NP   650 mg at 05/01/24 1001   alum & mag hydroxide-simeth (MAALOX/MYLANTA) 200-200-20 MG/5ML suspension 30 mL  30 mL Oral Q4H PRN Penn, Reymundo, NP       cariprazine  (VRAYLAR ) capsule 1.5 mg  1.5 mg Oral Daily Cleotilde Hoy HERO, NP   1.5 mg at 05/01/24 0844   chlorproMAZINE (THORAZINE) injection 50 mg  50 mg Intramuscular QID PRN Jadapalle, Sree, MD       chlorproMAZINE (THORAZINE) tablet 100 mg  100 mg Oral QID PRN Jadapalle, Sree, MD   100 mg at 05/01/24 1453   cholestyramine  light (PREVALITE ) packet 4 g  4 g Oral BID Cleotilde Hoy HERO, NP   4 g at 05/01/24 [phone]  clonazePAM  (KLONOPIN ) tablet 1 mg  1 mg Oral BID PRN Cleotilde Hoy HERO, NP   1 mg at 05/01/24 9147   cyclobenzaprine  (FLEXERIL ) tablet 10 mg  10 mg Oral Daily PRN Cleotilde Hoy HERO, NP   10 mg at 05/01/24 9147   desvenlafaxine  (PRISTIQ ) 24 hr tablet 50 mg  50 mg Oral Daily Lenon Elsie HERO, RPH   50 mg at 04/30/24 8164   haloperidol  (HALDOL ) tablet 5 mg  5 mg Oral TID PRN Sammy Molder, NP   5 mg at 05/01/24 1110    And   diphenhydrAMINE  (BENADRYL ) capsule 50 mg  50 mg Oral TID PRN Sammy Molder, NP   50 mg at 04/30/24 1626   haloperidol  lactate (HALDOL )  injection 10 mg  10 mg Intramuscular TID PRN Sammy Molder, NP        And   diphenhydrAMINE  (BENADRYL ) injection 50 mg  50 mg Intramuscular TID PRN Sammy Molder, NP        And   LORazepam  (ATIVAN ) injection 2 mg  2 mg Intramuscular TID PRN Sammy Molder, NP       haloperidol  lactate (HALDOL ) injection 5 mg  5 mg Intramuscular TID PRN Sammy Molder, NP        And   diphenhydrAMINE  (BENADRYL ) injection 50 mg  50 mg Intramuscular TID PRN Sammy Molder, NP   50 mg at 05/01/24 1111    And   LORazepam  (ATIVAN ) injection 2 mg  2 mg Intramuscular TID PRN Sammy Molder, NP   2 mg at 04/30/24 1659   lamoTRIgine  (LAMICTAL ) tablet 200 mg  200 mg Oral QHS Cleotilde Hoy HERO, NP   200 mg at 04/30/24 9045   lidocaine  (LIDODERM ) 5 % 1 patch  1 patch Transdermal Daily PRN Cleotilde Hoy HERO, NP   1 patch at 04/30/24 1319   magnesium  hydroxide (MILK OF MAGNESIA) suspension 15 mL  15 mL Oral Daily PRN Sammy Molder, NP       melatonin tablet 5 mg  5 mg Oral QHS Cleotilde Hoy HERO, NP   5 mg at 04/30/24 2052   ondansetron  (ZOFRAN -ODT) disintegrating tablet 4 mg  4 mg Oral Q8H PRN Cleotilde Hoy HERO, NP       pantoprazole  (PROTONIX ) EC tablet 40 mg  40 mg Oral Daily Penn, Molder, NP   40 mg at 05/01/24 0844   propranolol  (INDERAL ) tablet 10 mg  10 mg Oral TID PRN Cleotilde Hoy HERO, NP   10 mg at 04/30/24 1431   traZODone  (DESYREL ) tablet 100 mg  100 mg Oral QHS Penn, Molder, NP   100 mg at 04/30/24 2052   Vitamin D  (Ergocalciferol ) (DRISDOL ) 1.25 MG (50000 UNIT) capsule 50,000 Units  50,000 Units Oral Weekly Cleotilde Hoy HERO, NP   50,000 Units at 05/01/24 0845          Lab Results:  Lab Results Last 48 Hours        Results for orders placed or performed during the hospital encounter of 04/29/24 (from the past 48 hours)  Hemoglobin A1c     Status: None    Collection Time: 05/01/24  8:00 AM  Result Value Ref Range    Hgb A1c MFr Bld 5.0 4.8 - 5.6 %      Comment: (NOTE) Diagnosis of Diabetes The following  HbA1c ranges recommended by the American Diabetes Association (ADA) may be used as an aid in the diagnosis of diabetes mellitus.   Hemoglobin  Suggested A1C NGSP%              Diagnosis   <5.7                   Non Diabetic   5.7-6.4                Pre-Diabetic   >6.4                   Diabetic   <7.0                   Glycemic control for                       adults with diabetes.        Mean Plasma Glucose 96.8 mg/dL      Comment: Performed at [facility], [address] [place], [place] [address]  Lipid panel     Status: Abnormal    Collection Time: 05/01/24  8:00 AM  Result Value Ref Range    Cholesterol 229 (H) 0 - 200 mg/dL    Triglycerides 828 (H) <150 mg/dL    HDL 38 (L) >59 mg/dL    Total CHOL/HDL Ratio 6.0 RATIO    VLDL 34 0 - 40 mg/dL    LDL Cholesterol 842 (H) 0 - 99 mg/dL      Comment:        Total Cholesterol/HDL:CHD Risk Coronary Heart Disease Risk Table                     Men   Women  1/2 Average Risk   3.4   3.3  Average Risk       5.0   4.4  2 X Average Risk   9.6   7.1  3 X Average Risk  23.4   11.0        Use the calculated Patient Ratio above and the CHD Risk Table to determine the patient's CHD Risk.        ATP III CLASSIFICATION (LDL):  <100     mg/dL   Optimal  899-870  mg/dL   Near or Above                    Optimal  130-159  mg/dL   Borderline  839-810  mg/dL   High  >809     mg/dL   Very High Performed at [facility], [address] [place], [place] [address]          Blood Alcohol level:  Recent Labs       Lab Results  Component Value Date    [facility] <15 04/28/2024    ETH <10 11/10/2023        Metabolic Disorder Labs: Recent Labs       Lab Results  Component Value Date    HGBA1C 5.0 05/01/2024    MPG 96.8 05/01/2024    MPG 108 07/16/2023      Recent Labs       Lab Results  Component Value Date    PROLACTIN 16.1 09/20/2023      Recent Labs       Lab Results  Component Value Date     CHOL 229 (H) 05/01/2024    TRIG 171 (H) 05/01/2024    HDL 38 (L) 05/01/2024    CHOLHDL 6.0 05/01/2024    VLDL 34 05/01/2024  LDLCALC 157 (H) 05/01/2024    LDLCALC 151 (H) 11/10/2023              Psychiatric Specialty Exam:   Appearance (including eye contact): well-groomed, adequate hygiene, maintains eye contact  Behavior: demanding, emotionally reactive  Speech: pressured at times, otherwise normal  Mood: labile  Affect: incongruent with stated mood  Thought Process: linear  Thought Content (SI/HI/psychosis): denies all  Insight: limited  Judgment: impaired  Attention: distractible  Orientation: alert and oriented x3  Psychomotor Activity: within normal limits  Sleep: stable  Appetite: stable   Musculoskeletal: Strength & Muscle Tone: within normal limits Gait & Station: normal Assets  Assets: Housing; Advertising copywriter; Social Support; Manufacturing systems engineer; Desire for Improvement; Financial Resources/Insurance; Intimacy       Physical Exam: Physical Exam ROS Blood pressure 110/65, pulse 78, temperature (!) 97.4 F (36.3 C), resp. rate 14, height 5' 7 (1.702 m), weight 122.5 kg, SpO2 99%. Body mass index is 42.3 kg/m.        PLAN: Safety and Monitoring:             -- Voluntary admission to inpatient psychiatric unit for safety, stabilization and treatment             -- Daily contact with patient to assess and evaluate symptoms and progress in treatment             -- Patient's case to be discussed in multi-disciplinary team meeting             -- Observation Level : q15 minute checks             -- Vital signs:  q12 hours             -- Precautions: suicide, elopement, and assault -- Encouraged patient to participate in unit milieu and in scheduled group therapies    2. Psychiatric Diagnoses and Treatment:    Diagnosis: Principal Problem:  Bipolar disorder (HCC) Active Problems:   Borderline personality disorder (HCC)    Acute posttraumatic stress disorder   Cannabis abuse   Assessment: Patient presentation is meeting diagnostic criteria for Borderline Personality Disorder and Major Depressive Disorder, Recurrent, Severe, based on longstanding mood instability, affective dysregulation, self-harm behaviors, intrusive thoughts, and history of early trauma. Patient reports a pattern of unstable and intense interpersonal relationships, identity disturbance, impulsivity, recurrent suicidal behavior and gestures, affective instability, and chronic feelings of emptiness, further supporting the diagnosis of Borderline Personality Disorder. Presentation is also consistent with historical diagnosis of Bipolar II Disorder, primarily depressive episodes with mixed symptoms. Concerns raised regarding potential misdiagnosis of Bipolar I Disorder given the absence of clear manic symptoms. Further clinical assessment is needed for diagnostic clarity. There is notable impairment in daily functioning, high emotional reactivity, and chronic suicidal ideation. Risk factors include intrusive suicidal thoughts, history of overdose attempt, trauma history, emotional lability, and limited outpatient stability. Protective factors include employment, education, presence of a significant other, and engagement in care.  Plan: It is the judgment of the undersigned that patient is appropriate for treatment in this acute psychiatric setting related to concern for safety in the context of suicidal thoughts, emotional dysregulation, and self-injurious behaviors.     Medications:  Pristiq  [dose not specified] Klonopin  1 mg PO BID PRN for anxiety Lamictal  200 mg PO daily Propranolol  10 mg PO TID PRN for anxiety Trazodone  100 mg PO at bedtime Melatonin 5 mg PO at bedtime (initiated in hospital) Vraylar  3 mg PO daily (? today from  1.5 mg) Thorazine 100 mg PO QID PRN Thorazine 50 mg IM QID PRN                                         3. Medical  Issues Being Addressed: No acute needs identified       4. Discharge Planning:              -- Social work and case management to assist with discharge planning and identification of hospital follow-up needs prior to discharge             -- Estimated LOS: 3-4 days   Hoy CHRISTELLA Pinal, NP 05/01/2024, 5:20 PM

## 2024-05-04 DIAGNOSIS — F314 Bipolar disorder, current episode depressed, severe, without psychotic features: Secondary | ICD-10-CM

## 2024-05-04 MED ORDER — DESVENLAFAXINE SUCCINATE ER 50 MG PO TB24: 50.0000 mg | ORAL_TABLET | Freq: Every day | ORAL | 0 refills | Status: AC

## 2024-05-04 MED ORDER — LAMOTRIGINE 200 MG PO TABS: 200.0000 mg | ORAL_TABLET | Freq: Every day | ORAL | 0 refills | Status: AC

## 2024-05-04 MED ORDER — CHOLESTYRAMINE LIGHT 4 G PO PACK: 4.0000 g | PACK | Freq: Two times a day (BID) | ORAL | 0 refills | Status: AC

## 2024-05-04 MED ORDER — PROPRANOLOL HCL 10 MG PO TABS: 10.0000 mg | ORAL_TABLET | Freq: Every day | ORAL | 0 refills | Status: AC | PRN

## 2024-05-04 MED ORDER — CARIPRAZINE HCL 3 MG PO CAPS: 3.0000 mg | ORAL_CAPSULE | Freq: Every day | ORAL | 0 refills | Status: AC

## 2024-05-04 MED ORDER — PANTOPRAZOLE SODIUM 40 MG PO TBEC: 40.0000 mg | DELAYED_RELEASE_TABLET | Freq: Every day | ORAL | 0 refills | Status: AC

## 2024-05-04 MED ORDER — CLONAZEPAM 1 MG PO TABS: 1.0000 mg | ORAL_TABLET | Freq: Two times a day (BID) | ORAL | 0 refills | Status: AC | PRN

## 2024-05-04 MED ORDER — TRAZODONE HCL 100 MG PO TABS: 100.0000 mg | ORAL_TABLET | Freq: Every evening | ORAL | 0 refills | Status: AC | PRN

## 2024-05-04 NOTE — Plan of Care (Signed)
  Problem: Education: Goal: Mental status will improve Outcome: Progressing   Problem: Education: Goal: Emotional status will improve Outcome: Progressing   Problem: Education: Goal: Verbalization of understanding the information provided will improve Outcome: Progressing

## 2024-05-04 NOTE — Group Note (Signed)
 Recreation Therapy Group Note   Group Topic:Relaxation  Group Date: 05/04/2024 Start Time: 1530 End Time: 1610 Facilitators: Celestia Jeoffrey BRAVO, LRT, CTRS Location: Craft Room  Group Description: PMR (Progressive Muscle Relaxation). LRT educates patients on what PMR is and the benefits that come from it. Patients are asked to sit with their feet flat on the floor while sitting up and all the way back in their chair, if possible. LRT and pts follow a prompt through a speaker that requires you to tense and release different muscles in their body and focus on their breathing. During session, lights are off and soft music is being played.   Goal Area(s) Addressed:  Patients will be able to describe progressive muscle relaxation.  Patient will practice using relaxation technique. Patient will identify a new coping skill.  Patient will follow multistep directions to reduce anxiety and stress.  Affect/Mood: N/A   Participation Level: Did not attend    Clinical Observations/Individualized Feedback: Patient did not attend group.   Plan: Continue to engage patient in RT group sessions 2-3x/week.   Jeoffrey BRAVO Celestia, LRT, CTRS 05/04/2024 5:23 PM

## 2024-05-04 NOTE — Progress Notes (Signed)
 Discharge Note:  Patient denies SI/HI/AVH at this time. Discharge instructions, AVS, prescriptions, and transition record gone over with patient. Patient agrees to comply with medication management, follow-up visit, and outpatient therapy. Patient belongings returned to patient. Patient questions and concerns addressed and answered.  Patient discharged to home with family.

## 2024-05-04 NOTE — Group Note (Signed)
 Date:  05/04/2024 Time:  11:35 AM  Group Topic/Focus:  Developing a Wellness Toolbox:   The focus of this group is to help patients develop a wellness toolbox with skills and strategies to promote recovery upon discharge.    Participation Level:  Active  Participation Quality:  Appropriate  Affect:  Appropriate  Cognitive:  Appropriate  Insight: Appropriate  Engagement in Group:  Engaged  Modes of Intervention:  Activity  Additional Comments:    Terance Pomplun 05/04/2024, 11:35 AM

## 2024-05-04 NOTE — Progress Notes (Addendum)
  [name] :  Will you be returning to the same living situation after discharge:  Yes,  Patient to return home.  At discharge, do you have transportation home?: Yes,  patient's partner to provide transportation.  Do you have the ability to pay for your medications: Yes,  [employer] / [employer] PREFERRED  Release of information consent forms completed and in the chart;  Patient's signature needed at discharge.  Patient to Follow up at:  Follow-up Information     [facility]. Go to.   Why: In person medication management appointment is 05/26/24 at 12:00 PM with Gailen Ivans, PMHNP. Contact information: [address]  [place], [place] [address]   Phone: [phone] Fax: [phone]        [facility] Go to.   Why: Virtual therapy appointment 05/09/24 at 7 AM w/ your therapist, Burnard Becker. Contact information: [address] [place] [address] [phone]         [facility]. Call.   Why: Please follow up following your initial phone screening on 05/04/24 to assess. Contact information: [address], [place], [place], [address]  [phone] Fax: [phone]                Next level of care provider has access to [facility] Link:no  Safety Planning and Suicide Prevention discussed: Yes,  Education Completed; Barbara Orozco, [phone], Partner, has been identified by the patient as the family member/significant other with whom the patient will be residing, and identified as the person(s) who will aid the patient in the event of a mental health crisis (suicidal ideations/suicide attempt).  With written consent from the patient, the family member/significant other has been provided the following suicide prevention education, prior to the and/or following the discharge of the patient.     Has patient been referred to the Quitline?: Patient refused referral for  treatment  Patient has been referred for addiction treatment: No known substance use disorder.  Barbara CHRISTELLA Kerns, LCSW 05/04/2024, 4:04 PM

## 2024-05-04 NOTE — Progress Notes (Signed)
   05/03/24 2000  Psych Admission Type (Psych Patients Only)  Admission Status Involuntary  Psychosocial Assessment  Patient Complaints Anxiety  Eye Contact Fair  Facial Expression Blank  Affect Anxious  Speech Logical/coherent  Interaction Minimal  Motor Activity Pacing  Appearance/Hygiene Improved  Behavior Characteristics Cooperative;Appropriate to situation  Mood Anxious;Apprehensive  Thought Process  Coherency WDL  Content WDL  Delusions WDL  Perception WDL  Hallucination None reported or observed  Judgment WDL  Confusion WDL  Danger to Self  Current suicidal ideation? Denies  Agreement Not to Harm Self Yes  Description of Agreement verbal  Danger to Others  Danger to Others None reported or observed

## 2024-05-04 NOTE — Group Note (Signed)
 [facility] LCSW Group Therapy Note    Group Date: 05/04/2024 Start Time: 1300 End Time: 1400  Type of Therapy and Topic:  Group Therapy:  Overcoming Obstacles  Participation Level:  [facility] PARTICIPATION LEVEL: Did Not Attend  Mood:  Description of Group:   In this group patients will be encouraged to explore what they see as obstacles to their own wellness and recovery. They will be guided to discuss their thoughts, feelings, and behaviors related to these obstacles. The group will process together ways to cope with barriers, with attention given to specific choices patients can make. Each patient will be challenged to identify changes they are motivated to make in order to overcome their obstacles. This group will be process-oriented, with patients participating in exploration of their own experiences as well as giving and receiving support and challenge from other group members.  Therapeutic Goals: 1. Patient will identify personal and current obstacles as they relate to admission. 2. Patient will identify barriers that currently interfere with their wellness or overcoming obstacles.  3. Patient will identify feelings, thought process and behaviors related to these barriers. 4. Patient will identify two changes they are willing to make to overcome these obstacles:    Summary of Patient Progress   X   Therapeutic Modalities:   Cognitive Behavioral Therapy Solution Focused Therapy Motivational Interviewing Relapse Prevention Therapy   Sherryle JINNY Margo, LCSW

## 2024-05-04 NOTE — Plan of Care (Signed)
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
## 2024-05-04 NOTE — Group Note (Signed)
 Recreation Therapy Group Note   Group Topic:Coping Skills  Group Date: 05/04/2024 Start Time: 1045 End Time: 1130 Facilitators: Celestia Jeoffrey BRAVO, LRT, CTRS Location: Craft Room   Group Description: Mind Map.  Patient was provided a blank template of a diagram with 32 blank boxes in a tiered system, branching from the center (similar to a bubble chart). LRT directed patients to label the middle of the diagram Coping Skills. LRT and patients then came up with 8 different coping skills as examples. Pt were directed to record their coping skills in the 2nd tier boxes closest to the center.  Patients would then share their coping skills with the group as LRT wrote them out. LRT gave a handout of 99 different coping skills at the end of group.   Goal Area(s) Addressed: Patients will be able to define "coping skills". Patient will identify new coping skills.  Patient will increase communication.  Affect/Mood: N/A   Participation Level: Did not attend    Clinical Observations/Individualized Feedback: Patient did not attend group.   Plan: Continue to engage patient in RT group sessions 2-3x/week.   Jeoffrey BRAVO Celestia, LRT, CTRS 05/04/2024 1:46 PM

## 2024-05-04 NOTE — BHH Suicide Risk Assessment (Signed)
 [facility] Discharge Suicide Risk Assessment   Principal Problem: Bipolar disorder [facility]) Discharge Diagnoses: Principal Problem:   Bipolar disorder (HCC) Active Problems:   Borderline personality disorder (HCC)   Acute posttraumatic stress disorder   Cannabis abuse   Total Time spent with patient: 45 minutes  Musculoskeletal: Strength & Muscle Tone: within normal limits Gait & Station: normal Patient leans: N/A  Psychiatric Specialty Exam  Presentation  General Appearance:  Casual  Eye Contact: Fair  Speech: Clear and Coherent  Speech Volume: Normal  Handedness: Right   Mood and Affect  Mood: Euthymic  Duration of Depression Symptoms: Greater than two weeks  Affect: Congruent   Thought Process  Thought Processes: Coherent  Descriptions of Associations:Intact  Orientation:Full (Time, Place and Person)  Thought Content:Logical; WDL  History of Schizophrenia/Schizoaffective disorder:No  Duration of Psychotic Symptoms:No data recorded Hallucinations:Hallucinations: None  Ideas of Reference:None  Suicidal Thoughts:Suicidal Thoughts: No  Homicidal Thoughts:Homicidal Thoughts: No   Sensorium  Memory: Immediate Fair; Recent Fair  Judgment: Fair  Insight: Fair   Art therapist  Concentration: Fair  Attention Span: Good  Recall: Good  Fund of Knowledge: Good  Language: Good   Psychomotor Activity  Psychomotor Activity: Psychomotor Activity: Normal   Assets  Assets: Communication Skills; Desire for Improvement; Intimacy; Social Support; Health and safety inspector; Housing   Sleep  Sleep: Sleep: Fair  Estimated Sleeping Duration (Last 24 Hours): 7.50-9.50 hours  Physical Exam: Physical Exam Vitals and nursing note reviewed.  HENT:     Head: Atraumatic.   Eyes:     Extraocular Movements: Extraocular movements intact.   Pulmonary:     Effort: Pulmonary effort is normal.   Neurological:     Mental  Status: She is alert and oriented to person, place, and time.    Review of Systems  Psychiatric/Behavioral:  Positive for depression. Negative for hallucinations, memory loss, substance abuse and suicidal ideas. The patient does not have insomnia.    Blood pressure 112/61, pulse 65, temperature 97.7 F (36.5 C), resp. rate 20, height 5' 7 (1.702 m), weight 122.5 kg, SpO2 96%. Body mass index is 42.3 kg/m.  Mental Status Per Nursing Assessment::   On Admission:  Suicidal ideation indicated by others  Demographic Factors:  Caucasian  Loss Factors: NA  Historical Factors: Impulsivity  Risk Reduction Factors:   Sense of responsibility to family, Living with another person, especially a relative, Positive social support, Positive therapeutic relationship, and Positive coping skills or problem solving skills  Continued Clinical Symptoms:  Previous Psychiatric Diagnoses and Treatments  Cognitive Features That Contribute To Risk:  None    Suicide Risk:  Minimal: No identifiable suicidal ideation.  Patients presenting with no risk factors but with morbid ruminations; may be classified as minimal risk based on the severity of the depressive symptoms   Follow-up Information     [facility]. Go to.   Why: In person medication management appointment is 05/26/24 at 12:00 PM with Gailen Ivans, PMHNP. Contact information: [address]  [place], [place] [address]   Phone: [phone] Fax: [phone]        [facility] Go to.   Why: Virtual therapy appointment 05/09/24 at 7 AM w/ your therapist, Burnard Becker. Contact information: [address] [place] [address] [phone]         [facility]. Call.   Why: Please follow up following your initial phone screening on 05/04/24 to assess. Contact information: [address], [place], [place],  [address]  [phone] Fax: [phone]                 Plan Of Care/Follow-up recommendations:  # It is recommended to the patient to continue psychiatric medications as prescribed, after discharge from the hospital.   # It is recommended to the patient to follow up with your outpatient psychiatric provider and PCP. # It was discussed with the patient, the impact of alcohol, drugs, tobacco have been there overall psychiatric and medical wellbeing, and total abstinence from substance use was recommended. # Prescriptions provided or sent directly to preferred pharmacy at discharge. Patient agreeable to plan. Given the opportunity to ask questions. Appears to feel comfortable with discharge.  # In the event of worsening symptoms, the patient is instructed to call the crisis hotline (988), 911 and or go to the nearest ED for appropriate evaluation and treatment of symptoms. To follow-up with primary care provider for other medical issues, concerns and or health care needs # Patient was discharged home as requested with a plan to follow up as noted above.    Donnice FORBES Right, PA-C 05/04/2024, 3:30 PM

## 2024-05-04 NOTE — Progress Notes (Signed)
   05/04/24 0900  Psych Admission Type (Psych Patients Only)  Admission Status Voluntary  Psychosocial Assessment  Patient Complaints Anxiety  Eye Contact Fair  Facial Expression Blank  Affect Anxious  Speech Logical/coherent  Interaction Assertive  Motor Activity Pacing  Appearance/Hygiene In scrubs  Behavior Characteristics Cooperative;Anxious  Mood Anxious;Pleasant  Thought Process  Coherency WDL  Content WDL  Delusions None reported or observed  Perception WDL  Hallucination None reported or observed  Judgment WDL  Confusion WDL  Danger to Self  Current suicidal ideation? Denies  Agreement Not to Harm Self Yes  Description of Agreement verbal  Danger to Others  Danger to Others None reported or observed

## 2024-05-05 ENCOUNTER — Telehealth: Payer: Self-pay | Admitting: *Deleted

## 2024-05-05 NOTE — Transitions of Care (Post Inpatient/ED Visit) (Signed)
   05/05/2024  Name: Barbara Orozco MRN: [id number] DOB: 1986/03/14  Today's TOC FU Call Status: Today's TOC FU Call Status:: Unsuccessful Call (1st Attempt) Unsuccessful Call (1st Attempt) Date: 05/05/24  Attempted to reach the patient regarding the most recent Inpatient visit.  Received automated outgoing voice message stating that the person you are trying to call has a voice mail box that is full; please hang up and try your call again later; unable to leave voice message requesting call back   Follow Up Plan: Additional outreach attempts will be made to reach the patient to complete the Transitions of Care (Post Inpatient/ED visit) call.   Pls call/ message for questions,  Lenee Franze Mckinney Genice Kimberlin, RN, BSN, CCRN Alumnus RN Care Manager  Transitions of Care  [facility] - [facility] [phone]: direct office

## 2024-05-06 ENCOUNTER — Telehealth: Payer: Self-pay | Admitting: *Deleted

## 2024-05-06 NOTE — Transitions of Care (Post Inpatient/ED Visit) (Signed)
   05/06/2024  Name: Barbara Orozco MRN: [id number] DOB: 1986/02/27  Today's TOC FU Call Status: Today's TOC FU Call Status:: Unsuccessful Call (2nd Attempt) Unsuccessful Call (2nd Attempt) Date: 05/06/24 (patient refused TOC call: I am doing fine- don't like giving out my information to people; she then hung up phone without further conversation)  Attempted to reach the patient regarding the most recent Inpatient visit.  Follow Up Plan: No further outreach attempts will be made at this time. We have been unable to contact the patient.  Patient refused to provide HIPAA identifiers and stated she does not like discussing/ giving out her personal information to people- she hung phone up without allowing further explanation/ conversation  Pls call/ message for questions,  Keonda Dow Mckinney Seba Madole, RN, BSN, CCRN Alumnus RN Care Manager  Transitions of Care  [facility] - [facility] [phone]: direct office

## 2024-11-13 ENCOUNTER — Other Ambulatory Visit: Payer: Self-pay

## 2024-11-13 ENCOUNTER — Inpatient Hospital Stay (HOSPITAL_COMMUNITY)
Admission: AD | Admit: 2024-11-13 | Discharge: 2024-11-13 | Discharge status: Against Medical Advice | Attending: Obstetrics & Gynecology | Admitting: Obstetrics & Gynecology

## 2024-11-13 DIAGNOSIS — Z5321 Procedure and treatment not carried out due to patient leaving prior to being seen by health care provider: Secondary | ICD-10-CM | POA: Insufficient documentation

## 2024-11-13 DIAGNOSIS — I10 Essential (primary) hypertension: Secondary | ICD-10-CM | POA: Diagnosis not present

## 2024-11-13 DIAGNOSIS — Z3202 Encounter for pregnancy test, result negative: Secondary | ICD-10-CM | POA: Diagnosis not present

## 2024-11-13 LAB — HCG, QUANTITATIVE, PREGNANCY: hCG, Beta Chain, Quant, S: <1 m[IU]/mL

## 2024-11-13 LAB — POCT PREGNANCY, URINE: Preg Test, Ur: NEGATIVE

## 2024-11-13 NOTE — Progress Notes (Signed)
Not in lobby

## 2024-11-13 NOTE — MAU Note (Signed)
 Barbara Orozco is a 39 y.o. at Unknown here in MAU reporting: she had a +HPT approximately 2 weeks ago and began having cramping on the left side that's hurting and travel down her left leg that started today.  Reports only has left fallopian, right was removed secondary ectopic pregnancy.  Also states she has had spotting, but not currently.   Pt  teary in triage, states everytime she enters this room sg=he's told she lost a pregnancy.  LMP: 09/24/2024  Onset of complaint: yesterday Pain score: 6 Vitals:   11/13/24 1805  BP: (!) 142/93  Pulse: (!) 103  Resp: 20  Temp: 98.5 F (36.9 C)  SpO2: 98%     FHT: NA  Lab orders placed from triage: UPT

## 2024-11-13 NOTE — MAU Provider Note (Signed)
"   None     S Ms. Barbara Orozco is a 39 y.o. G26P0020 female who presents to MAU today with complaint of LLQ cramping. LMP 09/24/2024, reports LLQ cramping started yesterday. Today it has continued and is traveling down her left leg. She had a positive home pregnancy test 2 weeks ago.  Pertinent items noted in HPI and remainder of comprehensive ROS otherwise negative.   O BP (!) 142/93 (BP Location: Right Arm)   Pulse (!) 103   Temp 98.5 F (36.9 C) (Oral)   Resp 20   Ht 5' 7 (1.702 m)   Wt (!) 136.4 kg   LMP 09/24/2024   SpO2 98%   BMI 47.08 kg/m   Patient left AMA prior to being evaluated by a provider.  Results for orders placed or performed during the hospital encounter of 11/13/24 (from the past 24 hours)  Pregnancy, urine POC     Status: None   Collection Time: 11/13/24  5:44 PM  Result Value Ref Range   Preg Test, Ur NEGATIVE NEGATIVE  hCG, quantitative, pregnancy     Status: None   Collection Time: 11/13/24  6:44 PM  Result Value Ref Range   hCG, Beta Chain, Quant, S <1 <5 mIU/mL   MDM: MAU Course: -Mildly elevated BP and HR, afebrile. -negative urine pregnancy test and hCG. -Patient left AMA prior to being seen by a provider.  Joesph DELENA Sear, PA   "

## 2024-11-13 NOTE — MAU Note (Signed)
Not in lobby

## 2024-11-14 ENCOUNTER — Emergency Department (HOSPITAL_BASED_OUTPATIENT_CLINIC_OR_DEPARTMENT_OTHER)

## 2024-11-14 ENCOUNTER — Emergency Department (HOSPITAL_BASED_OUTPATIENT_CLINIC_OR_DEPARTMENT_OTHER)
Admission: EM | Admit: 2024-11-14 | Discharge: 2024-11-14 | Disposition: A | Discharge status: Home / Self Care | Attending: Emergency Medicine | Admitting: Emergency Medicine

## 2024-11-14 ENCOUNTER — Encounter (HOSPITAL_BASED_OUTPATIENT_CLINIC_OR_DEPARTMENT_OTHER): Payer: Self-pay

## 2024-11-14 DIAGNOSIS — R1032 Left lower quadrant pain: Secondary | ICD-10-CM | POA: Diagnosis present

## 2024-11-14 DIAGNOSIS — R519 Headache, unspecified: Secondary | ICD-10-CM | POA: Diagnosis not present

## 2024-11-14 LAB — CBC
HCT: 36.8 % (ref 36.0–46.0)
Hemoglobin: 12.1 g/dL (ref 12.0–15.0)
MCH: 28.1 pg (ref 26.0–34.0)
MCHC: 32.9 g/dL (ref 30.0–36.0)
MCV: 85.4 fL (ref 80.0–100.0)
Platelets: 327 K/uL (ref 150–400)
RBC: 4.31 MIL/uL (ref 3.87–5.11)
RDW: 13.4 % (ref 11.5–15.5)
WBC: 6.8 K/uL (ref 4.0–10.5)
nRBC: 0 % (ref 0.0–0.2)

## 2024-11-14 LAB — COMPREHENSIVE METABOLIC PANEL WITH GFR
ALT: 12 U/L (ref 0–44)
AST: 18 U/L (ref 15–41)
Albumin: 3.7 g/dL (ref 3.5–5.0)
Alkaline Phosphatase: 82 U/L (ref 38–126)
Anion gap: 11 (ref 5–15)
BUN: 10 mg/dL (ref 6–20)
CO2: 20 mmol/L — ABNORMAL LOW (ref 22–32)
Calcium: 9 mg/dL (ref 8.9–10.3)
Chloride: 108 mmol/L (ref 98–111)
Creatinine, Ser: 0.69 mg/dL (ref 0.44–1.00)
GFR, Estimated: >60 mL/min
Glucose, Bld: 102 mg/dL — ABNORMAL HIGH (ref 70–99)
Potassium: 4.2 mmol/L (ref 3.5–5.1)
Sodium: 139 mmol/L (ref 135–145)
Total Bilirubin: 0.2 mg/dL (ref 0.0–1.2)
Total Protein: 6.2 g/dL — ABNORMAL LOW (ref 6.5–8.1)

## 2024-11-14 LAB — URINALYSIS, ROUTINE W REFLEX MICROSCOPIC
Bilirubin Urine: NEGATIVE
Glucose, UA: NEGATIVE mg/dL
Hgb urine dipstick: NEGATIVE
Ketones, ur: NEGATIVE mg/dL
Leukocytes,Ua: NEGATIVE
Nitrite: NEGATIVE
Protein, ur: NEGATIVE mg/dL
Specific Gravity, Urine: 1.025 (ref 1.005–1.030)
pH: 6.5 (ref 5.0–8.0)

## 2024-11-14 LAB — RESP PANEL BY RT-PCR (RSV, FLU A&B, COVID)  RVPGX2
Influenza A by PCR: NEGATIVE
Influenza B by PCR: NEGATIVE
Resp Syncytial Virus by PCR: NEGATIVE
SARS Coronavirus 2 by RT PCR: NEGATIVE

## 2024-11-14 LAB — PREGNANCY, URINE: Preg Test, Ur: NEGATIVE

## 2024-11-14 LAB — LIPASE, BLOOD: Lipase: 21 U/L (ref 11–51)

## 2024-11-14 MED ORDER — MORPHINE SULFATE (PF) 4 MG/ML IV SOLN
4.0000 mg | Freq: Once | INTRAVENOUS | Status: AC
  Administered 2024-11-14: 4 mg via INTRAVENOUS
  Filled 2024-11-14: qty 1

## 2024-11-14 MED ORDER — HYDROMORPHONE HCL 1 MG/ML IJ SOLN
0.5000 mg | Freq: Once | INTRAMUSCULAR | Status: AC
  Administered 2024-11-14: 0.5 mg via INTRAVENOUS
  Filled 2024-11-14: qty 1

## 2024-11-14 MED ORDER — DIPHENHYDRAMINE HCL 25 MG PO CAPS
25.0000 mg | ORAL_CAPSULE | Freq: Once | ORAL | Status: AC | PRN
  Administered 2024-11-14: 25 mg via ORAL
  Filled 2024-11-14: qty 1

## 2024-11-14 MED ORDER — SODIUM CHLORIDE 0.9 % IV BOLUS
1000.0000 mL | Freq: Once | INTRAVENOUS | Status: AC
  Administered 2024-11-14: 1000 mL via INTRAVENOUS

## 2024-11-14 MED ORDER — OXYCODONE HCL 5 MG PO TABS
10.0000 mg | ORAL_TABLET | Freq: Once | ORAL | Status: AC
  Administered 2024-11-14: 10 mg via ORAL
  Filled 2024-11-14: qty 2

## 2024-11-14 MED ORDER — IOHEXOL 300 MG/ML  SOLN: 100.0000 mL | Freq: Once | INTRAMUSCULAR | Status: DC | PRN

## 2024-11-14 NOTE — ED Notes (Signed)

## 2024-11-14 NOTE — Discharge Instructions (Signed)
 If you develop worsening, continued, or recurrent abdominal pain, uncontrolled vomiting, fever, chest or back pain, or any other new/concerning symptoms then return to the ER for evaluation.

## 2024-11-14 NOTE — ED Provider Notes (Signed)
 " Barbara Orozco [facility] Provider Note   CSN: [id number] Arrival date & time: 11/14/24  1042     Patient presents with: Abdominal Pain   Barbara Orozco is a 39 y.o. female.   HPI 39 year old female presents with severe LLQ abdominal pain. She has had this pain for about 10 days. Is similar to pain she has with endometriosis. Typically gets pain prior to starting her menstrual cycle, but this pain has continued and worsened without any bleeding. Pain radiates down her left leg anteriorly. No urinary symptoms, vaginal bleeding or discharge, fevers, vomiting. No back pain. She has also had a scratchy throat since yesterday. Had neck swelling yesterday that resolved.  Also endorses a headache and increased non-epileptic seizures, which often happens when she's in pain or stressed.  Prior to Admission medications  Medication Sig Start Date End Date Taking? Authorizing Provider  cariprazine  (VRAYLAR ) 3 MG capsule Take 1 capsule (3 mg total) by mouth daily. 05/05/24   Millington, Matthew E, PA-C  cholestyramine  light (PREVALITE ) 4 g packet Take 1 packet (4 g total) by mouth 2 (two) times daily. 05/04/24   Millington, Matthew E, PA-C  clonazePAM  (KLONOPIN ) 1 MG tablet Take 1 tablet (1 mg total) by mouth 2 (two) times daily as needed (moderate anxiety). 05/04/24   Millington, Matthew E, PA-C  desvenlafaxine  (PRISTIQ ) 50 MG 24 hr tablet Take 1 tablet (50 mg total) by mouth daily. 05/04/24   Millington, Matthew E, PA-C  DIALYVITE VITAMIN D3 MAX 1.25 MG (50000 UT) TABS TAKE 1 TABLET BY MOUTH ONCE A WEEK Patient taking differently: Take 50,000 Units by mouth every Friday. 09/20/23   Lo, Arland POUR, CNM  lamoTRIgine  (LAMICTAL ) 200 MG tablet Take 1 tablet (200 mg total) by mouth at bedtime. 05/04/24   Millington, Matthew E, PA-C  lidocaine  (LIDODERM ) 5 % Place 1 patch onto the skin daily as needed (For back pain). Remove & Discard patch within 12 hours or as directed by MD     [provider]  Lumateperone Tosylate (CAPLYTA) 21 MG CAPS Take 21 mg by mouth daily.    [provider]  ondansetron  (ZOFRAN -ODT) 4 MG disintegrating tablet DISSOLVE 1 TABLET IN MOUTH EVERY 8 HOURS AS NEEDED FOR NAUSEA OR VOMITING 01/27/24   Avram Lupita BRAVO, MD  pantoprazole  (PROTONIX ) 40 MG tablet Take 1 tablet (40 mg total) by mouth daily. 05/04/24   Millington, Matthew E, PA-C  propranolol  (INDERAL ) 10 MG tablet Take 1 tablet (10 mg total) by mouth daily as needed (anxiety). 05/04/24   Millington, Matthew E, PA-C  traZODone  (DESYREL ) 100 MG tablet Take 1 tablet (100 mg total) by mouth at bedtime as needed for sleep. 05/04/24   Millington, Matthew E, PA-C    Allergies: Bee venom, Pork allergy, Tramadol, Cinnamon, Nsaids, Topamax  [topiramate ], Wellbutrin [bupropion], and Lithium    Review of Systems  Constitutional:  Negative for fever.  HENT:  Positive for sore throat.   Respiratory:  Negative for cough.   Gastrointestinal:  Positive for abdominal pain. Negative for vomiting.  Genitourinary:  Negative for dysuria, vaginal bleeding and vaginal discharge.  Musculoskeletal:  Negative for back pain.  Neurological:  Positive for headaches. Negative for weakness.    Updated Vital Signs BP 122/77   Pulse 69   Temp 97.8 F (36.6 C)   Resp 16   Ht 5' 7 (1.702 m)   Wt 136.1 kg   LMP 09/24/2024 (Approximate)   SpO2 100%   BMI  46.99 kg/m   Physical Exam Vitals and nursing note reviewed.  Constitutional:      Appearance: She is well-developed.  HENT:     Head: Normocephalic and atraumatic.  Eyes:     Extraocular Movements: Extraocular movements intact.     Pupils: Pupils are equal, round, and reactive to light.  Neck:     Comments: No neck swelling appreciated. Cardiovascular:     Rate and Rhythm: Normal rate and regular rhythm.     Heart sounds: Normal heart sounds.  Pulmonary:     Effort: Pulmonary effort is normal.     Breath sounds: Normal breath sounds. No  stridor.  Abdominal:     Palpations: Abdomen is soft.     Tenderness: There is abdominal tenderness in the left lower quadrant.  Musculoskeletal:     Cervical back: No rigidity.  Skin:    General: Skin is warm and dry.  Neurological:     Mental Status: She is alert.     Comments: CN 3-12 grossly intact. 5/5 strength in all 4 extremities     (all labs ordered are listed, but only abnormal results are displayed) Labs Reviewed  COMPREHENSIVE METABOLIC PANEL WITH GFR - Abnormal; Notable for the following components:      Result Value   CO2 20 (*)    Glucose, Bld 102 (*)    Total Protein 6.2 (*)    All other components within normal limits  RESP PANEL BY RT-PCR (RSV, FLU A&B, COVID)  RVPGX2  LIPASE, BLOOD  CBC  URINALYSIS, ROUTINE W REFLEX MICROSCOPIC  PREGNANCY, URINE    EKG: None  Radiology: US  PELVIC COMPLETE W TRANSVAGINAL AND TORSION R/O Result Date: 11/14/2024 CLINICAL DATA:  Pelvic pain. EXAM: TRANSABDOMINAL AND TRANSVAGINAL ULTRASOUND OF PELVIS DOPPLER ULTRASOUND OF OVARIES TECHNIQUE: Both transabdominal and transvaginal ultrasound examinations of the pelvis were performed. Transabdominal technique was performed for global imaging of the pelvis including uterus, ovaries, adnexal regions, and pelvic cul-de-sac. It was necessary to proceed with endovaginal exam following the transabdominal exam to visualize the uterus, endometrium, ovaries and adnexal regions. Color and duplex Doppler ultrasound was utilized to evaluate blood flow to the ovaries. COMPARISON:  09/11/2023. FINDINGS: Uterus Measurements: 6.4 x 3.9 x 4.7 cm = volume: 61.4 mL. No fibroids or other mass visualized. Endometrium Thickness: 6 mm, within normal limits for age. No focal abnormality visualized. Right ovary Measurements: 3.0 x 2.3 x 2.1 cm = volume:  7.5 mL. Normal appearance/no adnexal mass. Doppler: There is normal vascularity on color doppler examination. Spectral doppler arterial and venous waveforms are  normal. Left ovary Measurements: 2.3 x 2.1 x 2.1 cm = volume: 5.0 mL. Normal appearance/no adnexal mass. Doppler: There is normal vascularity on color doppler examination. Spectral doppler arterial and venous waveforms are normal. Other findings No abnormal free fluid. IMPRESSION: Normal exam. Electronically Signed   By: Newell Eke M.D.   On: 11/14/2024 13:51     Procedures   Medications Ordered in the ED  oxyCODONE  (Oxy IR/ROXICODONE ) immediate release tablet 10 mg (has no administration in time range)  iohexol  (OMNIPAQUE ) 300 MG/ML solution 100 mL (has no administration in time range)  sodium chloride  0.9 % bolus 1,000 mL (1,000 mLs Intravenous New Bag/Given 11/14/24 1247)  morphine  (PF) 4 MG/ML injection 4 mg (4 mg Intravenous Given 11/14/24 1247)  diphenhydrAMINE  (BENADRYL ) capsule 25-50 mg (25 mg Oral Given 11/14/24 1236)  HYDROmorphone  (DILAUDID ) injection 0.5 mg (0.5 mg Intravenous Given 11/14/24 1331)  Medical Decision Making Amount and/or Complexity of Data Reviewed Labs: ordered.    Details: Normal WBC, normal pregnancy. Radiology: ordered and independent interpretation performed.    Details: No ovarian torsion  Risk Prescription drug management.   Patient presents with left lower quadrant abdominal pain.  Feels like it is from her prior endometriosis.  Ultrasound is unremarkable.  No infectious symptoms such as UTI symptoms, vaginal discharge, etc.  Has a sore throat that is improving and negative COVID/flu.  Normal voice/phonation.  She is well-appearing.  She endorses an increase in her nonepileptic seizures.  These seem to be well-documented and I do not think she needs antiepileptics at this time.  Her lab work is unremarkable.  Her ultrasound is okay, due to this I did discuss a CT of the abdomen and pelvis if she feels like this is different than her typical endometriosis pain.  Originally agreed to CT but then declined.  Discussed  that cannot rule out other diagnoses such as diverticulitis without this.  She understands but just wants to go home.   She is asking for a prescription of pain medicine.  PDMP review shows she got 90 oxycodone  on 12/23.  She states she needs something stronger, that this is not taking give her pain.  Discussed that that is not something I can prescribe and we will give her a dose of oral oxycodone  here but she will have to discuss with her primary provider about acute on chronic pain management.  Otherwise will recommend following up with PCP and OB/GYN.  Given return precautions.  Vital signs are stable here.    Final diagnoses:  Left lower quadrant abdominal pain    ED Discharge Orders     None          Freddi Hamilton, MD 11/14/24 1417  "

## 2024-11-14 NOTE — ED Notes (Signed)
 ED Provider at bedside.

## 2024-11-14 NOTE — ED Triage Notes (Signed)
 Pt states for the past 3 days has had really bad pain on her ovary, hx of endometriosis, pain now more severe and going down her left leg. Hx of seizure disorder, now has been having more seizures than normal. Developed a scratchy throat and swelling yesterday. Hoarse in triage today.

## 2024-11-25 ENCOUNTER — Encounter (HOSPITAL_BASED_OUTPATIENT_CLINIC_OR_DEPARTMENT_OTHER): Payer: Self-pay | Admitting: Obstetrics & Gynecology

## 2024-11-25 ENCOUNTER — Ambulatory Visit (INDEPENDENT_AMBULATORY_CARE_PROVIDER_SITE_OTHER): Payer: Self-pay | Admitting: Obstetrics & Gynecology

## 2024-11-25 VITALS — BP 112/73 | HR 78 | Ht 67.0 in | Wt 289.8 lb

## 2024-11-25 DIAGNOSIS — F603 Borderline personality disorder: Secondary | ICD-10-CM

## 2024-11-25 DIAGNOSIS — F314 Bipolar disorder, current episode depressed, severe, without psychotic features: Secondary | ICD-10-CM | POA: Diagnosis not present

## 2024-11-25 DIAGNOSIS — Z8759 Personal history of other complications of pregnancy, childbirth and the puerperium: Secondary | ICD-10-CM

## 2024-11-25 DIAGNOSIS — Z8742 Personal history of other diseases of the female genital tract: Secondary | ICD-10-CM

## 2024-11-25 DIAGNOSIS — N809 Endometriosis, unspecified: Secondary | ICD-10-CM

## 2024-11-25 DIAGNOSIS — R1032 Left lower quadrant pain: Secondary | ICD-10-CM | POA: Diagnosis not present

## 2024-11-25 NOTE — Progress Notes (Signed)
 "  GYNECOLOGY  VISIT  CC:   Follow-up   HPI: 39 y.o. G72P0020 Divorced White or Caucasian female here for ED follow up. Patient reports that she has been experiencing pain in left lower pelvic area/hip for the last couple of weeks and it has become hard for her to bend/move certain ways. She reports that she went to the ED last week because pain was so bad and they did an ultrasound while she was there which was normal. She reports that she had no period in December. She took multiple pregnancy tests but they were negative. She had her period about a week ago but it was very light with spotting for only two days. She reports that pain did seem to get slightly better when on her period.   Pt has hx of endometriosis with findings noted during laparoscopy 12/11/2021 with ectopic pregnancy on the right s/p right salpingectomy.  Left ovary does appear with the images to have adhesions present.  There does appear to be some more deep invading endometriosis on images taken at the time of surgery as well.  I did see her once in 2024 and, at that time, her biggest concern was fertility.  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  We reviewed this finding at that time.  HSG was discussed and ordered.  Pt reports today she didn't have this done due to cost.  Today, her biggest concern is pain and she just wants this managed.  Of course she would like to keep tube if possible but wants pain to be improved.  Also ok if has to have Left ovary removed as well.    Current partner with her today.  Last pap - 09/06/2023 NILM w/negative HPV.   LMP: 11/21/2023  Past Medical History:  Diagnosis Date   Acute posttraumatic stress disorder    ADHD (attention deficit hyperactivity disorder), combined type    Allergy    Anal fistula    Arm fracture    right and left   Asthma    Bipolar 2 disorder (HCC)    Borderline personality disorder (HCC) 05/23/2022   Cannabis use disorder    Cocaine use  disorder, moderate, in early remission (HCC)    Complication of anesthesia    wakes up angry for about 5 minutes   Depression    Endometriosis    Foot pain    right foot hurts since last night no reported injury per pt on 01-25-2022   GERD (gastroesophageal reflux disease)    Grief associated with loss of fetus    History of 2019 novel coronavirus disease (COVID-19) 04/29/2020   + Ag test - can see in Care everywhere   History of chicken pox    had 4 or 5 times as child   History of COVID-19 04/29/2020   mild all symptoms resolved   IBS (irritable bowel syndrome)    Irritable bowel    Leg fracture, left    Psychogenic nonepileptic seizure    last seizure 1 month ago per pt on 01-25-2022 saw dr lonne shivers neurology   Seizure [facility])    last seizure 12-28-2021 per pt on 01-25-2022   Self-injurious behavior    Shingles    intermittent outbreaks   Ulcerative colitis (HCC)    Wears glasses     MEDS:  Reviewed in EPIC  ALLERGIES: Bee venom, Pork allergy, Tramadol, Cinnamon, Nsaids, Topamax  [topiramate ], Wellbutrin [bupropion], and Lithium  SH:  former smoker, divorced  Review of Systems  Constitutional: Negative.   Genitourinary:        LLQ pain    PHYSICAL EXAMINATION:    BP 112/73 (BP Location: Right Arm, Patient Position: Sitting, Cuff Size: Large)   Pulse 78   Ht 5' 7 (1.702 m)   Wt 289 lb 12.8 oz (131.5 kg)   LMP 11/20/2024 (Approximate)   SpO2 96%   BMI 45.39 kg/m     General appearance: alert, cooperative and appears stated age Abdomen: soft, non-tender; bowel sounds normal; no masses,  no organomegaly Lymph:  no inguinal LAD noted  Pelvic: External genitalia:  no lesions              Urethra:  normal appearing urethra with no masses, tenderness or lesions              Bartholins and Skenes: normal                 Vagina: normal mucosa without prolapse or lesions              Cervix: no lesions              Bimanual Exam:  Uterus:  tenderness on exam esp on  left, no CMT              Adnexa: left adnexal tenderness  Chaperone was present for exam.  Assessment/Plan: 1. LLQ pain (Primary) - likely due to endometriosis which is documented on laparoscopic surgery from 2023.  Willing to see specialist for removal/treatment as well - ok with removal of left ovary and/or tube if needed to improve pain  2. Endometriosis  3. Bipolar disorder, current episode depressed, severe, unspecified whether psychotic features (HCC) - on caplyta, klonopin  and prestiq.  We did discuss hormonal options for treatment and she is very concerned about an changes with mental health related to hormonal changes.  Declines GNRH agonist and antagonist therapy as well as OCPs or progesterone methods.  4. Borderline personality disorder (HCC)  5. History of ectopic pregnancy - s/p right salpingectomy   "

## 2024-12-09 ENCOUNTER — Emergency Department (HOSPITAL_BASED_OUTPATIENT_CLINIC_OR_DEPARTMENT_OTHER)
Admission: EM | Admit: 2024-12-09 | Discharge: 2024-12-09 | Disposition: A | Discharge status: Home / Self Care | Attending: Emergency Medicine | Admitting: Emergency Medicine

## 2024-12-09 ENCOUNTER — Emergency Department (HOSPITAL_BASED_OUTPATIENT_CLINIC_OR_DEPARTMENT_OTHER)

## 2024-12-09 ENCOUNTER — Encounter (HOSPITAL_BASED_OUTPATIENT_CLINIC_OR_DEPARTMENT_OTHER): Payer: Self-pay | Admitting: Emergency Medicine

## 2024-12-09 ENCOUNTER — Other Ambulatory Visit: Payer: Self-pay

## 2024-12-09 DIAGNOSIS — R0602 Shortness of breath: Secondary | ICD-10-CM | POA: Insufficient documentation

## 2024-12-09 DIAGNOSIS — Z5329 Procedure and treatment not carried out because of patient's decision for other reasons: Secondary | ICD-10-CM | POA: Insufficient documentation

## 2024-12-09 DIAGNOSIS — R791 Abnormal coagulation profile: Secondary | ICD-10-CM | POA: Insufficient documentation

## 2024-12-09 DIAGNOSIS — M7989 Other specified soft tissue disorders: Secondary | ICD-10-CM | POA: Insufficient documentation

## 2024-12-09 DIAGNOSIS — R0789 Other chest pain: Secondary | ICD-10-CM | POA: Insufficient documentation

## 2024-12-09 DIAGNOSIS — J45909 Unspecified asthma, uncomplicated: Secondary | ICD-10-CM | POA: Insufficient documentation

## 2024-12-09 LAB — CBC
HCT: 38.9 % (ref 36.0–46.0)
Hemoglobin: 12.8 g/dL (ref 12.0–15.0)
MCH: 27.8 pg (ref 26.0–34.0)
MCHC: 32.9 g/dL (ref 30.0–36.0)
MCV: 84.4 fL (ref 80.0–100.0)
Platelets: 352 10*3/uL (ref 150–400)
RBC: 4.61 MIL/uL (ref 3.87–5.11)
RDW: 13.3 % (ref 11.5–15.5)
WBC: 10.6 10*3/uL — ABNORMAL HIGH (ref 4.0–10.5)
nRBC: 0 % (ref 0.0–0.2)

## 2024-12-09 LAB — BASIC METABOLIC PANEL WITH GFR
Anion gap: 10 (ref 5–15)
BUN: 13 mg/dL (ref 6–20)
CO2: 22 mmol/L (ref 22–32)
Calcium: 9.1 mg/dL (ref 8.9–10.3)
Chloride: 103 mmol/L (ref 98–111)
Creatinine, Ser: 0.65 mg/dL (ref 0.44–1.00)
GFR, Estimated: >60 mL/min
Glucose, Bld: 93 mg/dL (ref 70–99)
Potassium: 4.1 mmol/L (ref 3.5–5.1)
Sodium: 136 mmol/L (ref 135–145)

## 2024-12-09 LAB — D-DIMER, QUANTITATIVE: D-Dimer, Quant: 0.84 ug{FEU}/mL — ABNORMAL HIGH (ref 0.00–0.50)

## 2024-12-09 LAB — TROPONIN T, HIGH SENSITIVITY: Troponin T High Sensitivity: <6 ng/L (ref 0–19)

## 2024-12-09 LAB — PRO BRAIN NATRIURETIC PEPTIDE: Pro Brain Natriuretic Peptide: 67.8 pg/mL

## 2024-12-09 NOTE — ED Triage Notes (Signed)
 Shortness of breath x 2 weeks , getting progressively worse. Bilateral leg and hand edema.  Reports took dieretics OTC without relief .   Chest pain with deep breathing .  Hx asthma, no cough or URI .

## 2024-12-09 NOTE — ED Notes (Signed)
 Upon assessment of this pt, she is very upset that nothing has been done or explained to her in an hour. This RN explained the plan of care. Pt continued to be upset and stated that she wanted to leave the ED and that she would be seen at another facility. Explained to pt safety risks and notified EDP Signed AMA and seen leaving ED without any difficulty.

## 2024-12-09 NOTE — ED Provider Notes (Signed)
 "  [facility] AT [facility] Provider Note   CSN: [id number] Arrival date & time: 12/09/24  1940     Patient presents with: Shortness of Breath   Barbara Orozco is a 39 y.o. female.   Patient here with shortness of breath leg swelling.  She has been taking over-the-counter diuretics without much relief.  She has had shortness of breath for 2 weeks gotten worse noticed swelling in her legs and hand.  She has had chest pain with deep breathing.  History of asthma but does not feel like asthma.  No cough no fever no chills.  The history is provided by the patient.       Prior to Admission medications  Medication Sig Start Date End Date Taking? Authorizing Provider  cariprazine  (VRAYLAR ) 3 MG capsule Take 1 capsule (3 mg total) by mouth daily. 05/05/24   Millington, Matthew E, PA-C  cholestyramine  light (PREVALITE ) 4 g packet Take 1 packet (4 g total) by mouth 2 (two) times daily. 05/04/24   Millington, Matthew E, PA-C  clonazePAM  (KLONOPIN ) 1 MG tablet Take 1 tablet (1 mg total) by mouth 2 (two) times daily as needed (moderate anxiety). 05/04/24   Millington, Matthew E, PA-C  desvenlafaxine  (PRISTIQ ) 50 MG 24 hr tablet Take 1 tablet (50 mg total) by mouth daily. 05/04/24   Millington, Matthew E, PA-C  DIALYVITE VITAMIN D3 MAX 1.25 MG (50000 UT) TABS TAKE 1 TABLET BY MOUTH ONCE A WEEK Patient taking differently: Take 50,000 Units by mouth every Friday. 09/20/23   Lo, Arland POUR, CNM  lamoTRIgine  (LAMICTAL ) 200 MG tablet Take 1 tablet (200 mg total) by mouth at bedtime. 05/04/24   Millington, Matthew E, PA-C  lidocaine  (LIDODERM ) 5 % Place 1 patch onto the skin daily as needed (For back pain). Remove & Discard patch within 12 hours or as directed by MD    [provider]  Lumateperone Tosylate (CAPLYTA) 21 MG CAPS Take 21 mg by mouth daily.    [provider]  ondansetron  (ZOFRAN -ODT) 4 MG disintegrating tablet DISSOLVE 1 TABLET IN MOUTH EVERY 8 HOURS AS  NEEDED FOR NAUSEA OR VOMITING 01/27/24   Avram Lupita BRAVO, MD  pantoprazole  (PROTONIX ) 40 MG tablet Take 1 tablet (40 mg total) by mouth daily. 05/04/24   Millington, Matthew E, PA-C  propranolol  (INDERAL ) 10 MG tablet Take 1 tablet (10 mg total) by mouth daily as needed (anxiety). 05/04/24   Millington, Matthew E, PA-C  traZODone  (DESYREL ) 100 MG tablet Take 1 tablet (100 mg total) by mouth at bedtime as needed for sleep. 05/04/24   Millington, Matthew E, PA-C    Allergies: Bee venom, Pork allergy, Tramadol, Cinnamon, Nsaids, Topamax  [topiramate ], Wellbutrin [bupropion], and Lithium    Review of Systems  Updated Vital Signs BP 127/85 (BP Location: Right Arm)   Pulse 85   Temp 97.7 F (36.5 C)   Resp (!) 22   Wt 131.5 kg   LMP 11/20/2024 (Approximate)   SpO2 95%   BMI 45.42 kg/m   Physical Exam Vitals and nursing note reviewed.  Constitutional:      General: She is not in acute distress.    Appearance: She is well-developed. She is not ill-appearing.  HENT:     Head: Normocephalic and atraumatic.     Mouth/Throat:     Mouth: Mucous membranes are moist.  Eyes:     Extraocular Movements: Extraocular movements intact.     Conjunctiva/sclera: Conjunctivae normal.     Pupils:  Pupils are equal, round, and reactive to light.  Cardiovascular:     Rate and Rhythm: Normal rate and regular rhythm.     Pulses: Normal pulses.     Heart sounds: No murmur heard. Pulmonary:     Effort: Pulmonary effort is normal. No respiratory distress.     Breath sounds: Normal breath sounds.  Abdominal:     Palpations: Abdomen is soft.     Tenderness: There is no abdominal tenderness.  Musculoskeletal:        General: No swelling. Normal range of motion.     Cervical back: Normal range of motion and neck supple.     Comments: Trace edema b/l  Skin:    General: Skin is warm and dry.     Capillary Refill: Capillary refill takes less than 2 seconds.  Neurological:     Mental Status: She is alert.   Psychiatric:        Mood and Affect: Mood normal.     (all labs ordered are listed, but only abnormal results are displayed) Labs Reviewed  CBC - Abnormal; Notable for the following components:      Result Value   WBC 10.6 (*)    All other components within normal limits  D-DIMER, QUANTITATIVE - Abnormal; Notable for the following components:   D-Dimer, Quant 0.84 (*)    All other components within normal limits  RESP PANEL BY RT-PCR (RSV, FLU A&B, COVID)  RVPGX2  BASIC METABOLIC PANEL WITH GFR  PRO BRAIN NATRIURETIC PEPTIDE  HEPATIC FUNCTION PANEL  HCG, SERUM, QUALITATIVE  TROPONIN T, HIGH SENSITIVITY    EKG: EKG Interpretation Date/Time:  Wednesday December 09 2024 19:51:34 EST Ventricular Rate:  79 PR Interval:  163 QRS Duration:  88 QT Interval:  386 QTC Calculation: 443 R Axis:   -2  Text Interpretation: Sinus rhythm Confirmed by Barbara Orozco [phone]) on 12/09/2024 8:10:03 PM  Radiology: Barbara Orozco Chest 2 View Result Date: 12/09/2024 CLINICAL DATA:  Short of breath for 2 weeks, extremity edema EXAM: CHEST - 2 VIEW COMPARISON:  05/09/2020 FINDINGS: The heart size and mediastinal contours are within normal limits. Both lungs are clear. The visualized skeletal structures are unremarkable. IMPRESSION: No active cardiopulmonary disease. Electronically Signed   By: Barbara Orozco M.D.   On: 12/09/2024 20:11     Procedures   Medications Ordered in the ED - No data to display                                  Medical Decision Making Amount and/or Complexity of Data Reviewed Labs: ordered. Radiology: ordered.   Barbara Orozco is here shortness of breath leg swelling.  Unremarkable vitals.  No fever.  Maybe trace swelling in the legs but there is no obvious pitting edema.  Do not really appreciate any swelling to her upper legs or arms.  She got clear breath sounds.  She has history of personality disorder polysubstance abuse depression.  She has history of psychogenic  nonepileptic seizures.  Overall EKG shows sinus rhythm.  No ischemic changes.  Will evaluate for ACS PE infectious process anemia heart failure liver failure or kidney failure.  Patient left AGAINST MEDICAL ADVICE/eloped from the ED.  Nursing staff came to me and stated that she did not want to stay for CT scan because things were not happening fast enough.  Ultimately we did her best to get her into room to do labs  and evaluate her.  Her D-dimer was elevated she understood fully well while we are getting a CT scan to make sure there was not a blood clot or any other acute process in her lungs.  Her troponin was normal her electrolytes were unremarkable.  Her BNP was normal.  I doubt heart failure I doubt ACS.  Function was normal.  Plan was to get a CT of her chest repeated troponin and check her hepatic function panel to see if there was any liver dysfunction.  Ultimately patient eloped/left AGAINST MEDICAL ADVICE.  She had capacity to make this decision.  She understood full well the workup that I wanted to pursue in the things that I wanted to rule out.  Patient left.  This chart was dictated using voice recognition software.  Despite best efforts to proofread,  errors can occur which can change the documentation meaning.      Final diagnoses:  SOB (shortness of breath)    ED Discharge Orders     None          Barbara Cornet, DO 12/09/24 2205  "

## 2024-12-10 ENCOUNTER — Encounter (HOSPITAL_COMMUNITY): Payer: Self-pay

## 2024-12-10 ENCOUNTER — Other Ambulatory Visit: Payer: Self-pay

## 2024-12-10 ENCOUNTER — Emergency Department (HOSPITAL_COMMUNITY)
Admission: EM | Admit: 2024-12-10 | Discharge: 2024-12-10 | Discharge status: Against Medical Advice | Source: Ambulatory Visit | Attending: Emergency Medicine | Admitting: Emergency Medicine

## 2024-12-10 DIAGNOSIS — Z5321 Procedure and treatment not carried out due to patient leaving prior to being seen by health care provider: Secondary | ICD-10-CM | POA: Insufficient documentation

## 2024-12-10 DIAGNOSIS — R0602 Shortness of breath: Secondary | ICD-10-CM | POA: Insufficient documentation

## 2024-12-10 LAB — POC URINE PREG, ED: Preg Test, Ur: NEGATIVE

## 2024-12-10 NOTE — ED Notes (Signed)
 Pt left AMA

## 2024-12-10 NOTE — ED Notes (Signed)
Pt IV removed

## 2024-12-10 NOTE — ED Triage Notes (Signed)
 Pt came in via POV d/t swelling in her feet that stared about 2 weeks ago & then she noticed it began moving up & swelling in her hands & now it feels heavy & hard to breathe with pressure on her lungs. She was seen at another facility & she was told her labs were WNL except her d-dimer & WBC count. She had to leave d/t needing to leave & take care of a family member. She is here now to see if she can see if she has any blood clots (per pt). A/Ox4, rates her chronic pain 3/10, just endorses like a burning feel in her legs & heaviness in her chest.

## 2024-12-10 NOTE — ED Provider Notes (Signed)
 " [place] EMERGENCY DEPARTMENT AT [facility] Provider Note   CSN: [id number] Arrival date & time: 12/10/24  1020     Patient presents with: Swelling and Chest Heaviness   Barbara Orozco is a 39 y.o. female.   HPI Adult female presents 1 day after leaving [facility] with persistent dyspnea. She notes that she was evaluated there for shortness of breath, continues to have it today.  She had to leave due to a family emergency.  She now presents for continued evaluation. No interval substantial changes, ongoing shortness of breath, particular with activity.    Prior to Admission medications  Medication Sig Start Date End Date Taking? Authorizing Provider  cariprazine  (VRAYLAR ) 3 MG capsule Take 1 capsule (3 mg total) by mouth daily. 05/05/24   Millington, Matthew E, PA-C  cholestyramine  light (PREVALITE ) 4 g packet Take 1 packet (4 g total) by mouth 2 (two) times daily. 05/04/24   Millington, Matthew E, PA-C  clonazePAM  (KLONOPIN ) 1 MG tablet Take 1 tablet (1 mg total) by mouth 2 (two) times daily as needed (moderate anxiety). 05/04/24   Millington, Matthew E, PA-C  desvenlafaxine  (PRISTIQ ) 50 MG 24 hr tablet Take 1 tablet (50 mg total) by mouth daily. 05/04/24   Millington, Matthew E, PA-C  DIALYVITE VITAMIN D3 MAX 1.25 MG (50000 UT) TABS TAKE 1 TABLET BY MOUTH ONCE A WEEK Patient taking differently: Take 50,000 Units by mouth every Friday. 09/20/23   Lo, Arland POUR, CNM  lamoTRIgine  (LAMICTAL ) 200 MG tablet Take 1 tablet (200 mg total) by mouth at bedtime. 05/04/24   Millington, Matthew E, PA-C  lidocaine  (LIDODERM ) 5 % Place 1 patch onto the skin daily as needed (For back pain). Remove & Discard patch within 12 hours or as directed by MD    [provider]  Lumateperone Tosylate (CAPLYTA) 21 MG CAPS Take 21 mg by mouth daily.    [provider]  ondansetron  (ZOFRAN -ODT) 4 MG disintegrating tablet DISSOLVE 1 TABLET IN MOUTH  EVERY 8 HOURS AS NEEDED FOR NAUSEA OR VOMITING 01/27/24   Avram Lupita BRAVO, MD  pantoprazole  (PROTONIX ) 40 MG tablet Take 1 tablet (40 mg total) by mouth daily. 05/04/24   Millington, Matthew E, PA-C  propranolol  (INDERAL ) 10 MG tablet Take 1 tablet (10 mg total) by mouth daily as needed (anxiety). 05/04/24   Millington, Matthew E, PA-C  traZODone  (DESYREL ) 100 MG tablet Take 1 tablet (100 mg total) by mouth at bedtime as needed for sleep. 05/04/24   Millington, Matthew E, PA-C    Allergies: Bee venom, Pork allergy, Tramadol, Cinnamon, Nsaids, Topamax  [topiramate ], Wellbutrin [bupropion], and Lithium    Review of Systems  Updated Vital Signs BP (!) 125/46   Pulse 89   Temp 98.7 F (37.1 C) (Oral)   Resp 18   LMP 11/20/2024 (Approximate)   SpO2 100%   Physical Exam Vitals and nursing note reviewed.  Constitutional:      Appearance: She is well-developed. She is obese. She is not ill-appearing, toxic-appearing or diaphoretic.  HENT:     Head: Normocephalic and atraumatic.  Eyes:     Conjunctiva/sclera: Conjunctivae normal.  Cardiovascular:     Rate and Rhythm: Regular rhythm. Tachycardia present.  Pulmonary:     Effort: Pulmonary effort is normal. No respiratory distress.     Breath sounds: Normal breath sounds. No stridor.  Abdominal:     General: There is no distension.  Skin:    General: Skin  is warm and dry.  Neurological:     Mental Status: She is alert and oriented to person, place, and time.     Cranial Nerves: No cranial nerve deficit.  Psychiatric:        Mood and Affect: Mood normal.     (all labs ordered are listed, but only abnormal results are displayed) Labs Reviewed  POC URINE PREG, ED    EKG: EKG Interpretation Date/Time:  Thursday December 10 2024 12:01:08 EST Ventricular Rate:  81 PR Interval:  158 QRS Duration:  82 QT Interval:  372 QTC Calculation: 432 R Axis:   -1  Text Interpretation: Normal sinus rhythm No significant change since last  tracing Confirmed by Garrick Charleston [phone]) on 12/10/2024 2:44:42 PM  Radiology: DG Chest 2 View Result Date: 12/09/2024 CLINICAL DATA:  Short of breath for 2 weeks, extremity edema EXAM: CHEST - 2 VIEW COMPARISON:  05/09/2020 FINDINGS: The heart size and mediastinal contours are within normal limits. Both lungs are clear. The visualized skeletal structures are unremarkable. IMPRESSION: No active cardiopulmonary disease. Electronically Signed   By: Ozell Daring M.D.   On: 12/09/2024 20:11     Procedures   Medications Ordered in the ED - No data to display                                  Medical Decision Making Obese adult female presents with shortness of breath, positive D-dimer yesterday. Patient is awake, alert, not hypoxic, not tachycardic or tachypneic, broad differential include pneumonia, asthma, bronchitis, PE given her positive D-dimer. CT angiography ordered labs from yesterday reviewed, negative troponin, negative BNP both reassuring. Pulse ox 99% room air normal  Amount and/or Complexity of Data Reviewed External Data Reviewed: notes.    Details: Yesterday's notes reviewed Labs:  Decision-making details documented in ED Course.    Details: Not pregnant ECG/medicine tests: ordered and independent interpretation performed. Decision-making details documented in ED Course.  Risk Prescription drug management. Decision regarding hospitalization. Diagnosis or treatment significantly limited by social determinants of health.  2:45 PM Patient has eloped.   Final diagnoses:  SOB (shortness of breath)    ED Discharge Orders     None          Garrick Charleston, MD 12/10/24 1445  "
